# Patient Record
Sex: Female | Born: 1965 | Race: White | Hispanic: No | Marital: Married | Smoking: Never smoker
Health system: Southern US, Community
[De-identification: ages and names within clinical notes are randomized; demographics above are authoritative.]

## PROBLEM LIST (undated history)

## (undated) DIAGNOSIS — K649 Unspecified hemorrhoids: Secondary | ICD-10-CM

## (undated) DIAGNOSIS — E0590 Thyrotoxicosis, unspecified without thyrotoxic crisis or storm: Secondary | ICD-10-CM

## (undated) DIAGNOSIS — Z9289 Personal history of other medical treatment: Secondary | ICD-10-CM

## (undated) DIAGNOSIS — K219 Gastro-esophageal reflux disease without esophagitis: Secondary | ICD-10-CM

## (undated) DIAGNOSIS — J309 Allergic rhinitis, unspecified: Secondary | ICD-10-CM

## (undated) DIAGNOSIS — B019 Varicella without complication: Secondary | ICD-10-CM

## (undated) DIAGNOSIS — E7800 Pure hypercholesterolemia, unspecified: Secondary | ICD-10-CM

## (undated) DIAGNOSIS — C50919 Malignant neoplasm of unspecified site of unspecified female breast: Secondary | ICD-10-CM

## (undated) DIAGNOSIS — R519 Headache, unspecified: Secondary | ICD-10-CM

## (undated) DIAGNOSIS — R51 Headache: Secondary | ICD-10-CM

## (undated) HISTORY — PX: WISDOM TOOTH EXTRACTION: SHX21

## (undated) HISTORY — DX: Headache, unspecified: R51.9

## (undated) HISTORY — DX: Varicella without complication: B01.9

## (undated) HISTORY — PX: OVARIAN CYST REMOVAL: SHX89

## (undated) HISTORY — PX: APPENDECTOMY: SHX54

## (undated) HISTORY — DX: Allergic rhinitis, unspecified: J30.9

## (undated) HISTORY — DX: Personal history of other medical treatment: Z92.89

## (undated) HISTORY — PX: ABDOMINAL SURGERY: SHX537

## (undated) HISTORY — PX: TUBAL LIGATION: SHX77

## (undated) HISTORY — DX: Headache: R51

## (undated) HISTORY — DX: Malignant neoplasm of unspecified site of unspecified female breast: C50.919

---

## 1999-08-27 ENCOUNTER — Other Ambulatory Visit: Admission: RE | Admit: 1999-08-27 | Discharge: 1999-08-27 | Payer: Self-pay | Admitting: Gynecology

## 2002-03-21 ENCOUNTER — Other Ambulatory Visit: Admission: RE | Admit: 2002-03-21 | Discharge: 2002-03-21 | Payer: Self-pay | Admitting: Obstetrics & Gynecology

## 2004-09-01 ENCOUNTER — Other Ambulatory Visit: Admission: RE | Admit: 2004-09-01 | Discharge: 2004-09-01 | Payer: Self-pay | Admitting: Obstetrics & Gynecology

## 2010-03-31 ENCOUNTER — Emergency Department: Payer: Self-pay | Admitting: Emergency Medicine

## 2012-03-02 ENCOUNTER — Emergency Department (HOSPITAL_COMMUNITY): Payer: Commercial Managed Care - PPO

## 2012-03-02 ENCOUNTER — Encounter (HOSPITAL_COMMUNITY): Payer: Self-pay | Admitting: *Deleted

## 2012-03-02 ENCOUNTER — Encounter (HOSPITAL_COMMUNITY): Payer: Self-pay | Admitting: Anesthesiology

## 2012-03-02 ENCOUNTER — Encounter (HOSPITAL_COMMUNITY)
Admission: EM | Disposition: A | Discharge status: Home / Self Care | Payer: Self-pay | Source: Home / Self Care | Attending: General Surgery

## 2012-03-02 ENCOUNTER — Observation Stay (HOSPITAL_COMMUNITY)
Admission: EM | Admit: 2012-03-02 | Discharge: 2012-03-04 | Disposition: A | Discharge status: Home / Self Care | Payer: Commercial Managed Care - PPO | Attending: General Surgery | Admitting: General Surgery

## 2012-03-02 ENCOUNTER — Emergency Department (HOSPITAL_COMMUNITY): Payer: Commercial Managed Care - PPO | Admitting: Anesthesiology

## 2012-03-02 ENCOUNTER — Encounter (HOSPITAL_COMMUNITY): Payer: Self-pay | Admitting: General Practice

## 2012-03-02 DIAGNOSIS — K3580 Unspecified acute appendicitis: Principal | ICD-10-CM | POA: Insufficient documentation

## 2012-03-02 HISTORY — PX: LAPAROSCOPIC APPENDECTOMY: SHX408

## 2012-03-02 LAB — URINALYSIS, ROUTINE W REFLEX MICROSCOPIC
Glucose, UA: NEGATIVE mg/dL
Ketones, ur: NEGATIVE mg/dL
Leukocytes, UA: NEGATIVE
Protein, ur: NEGATIVE mg/dL
Urobilinogen, UA: 1 mg/dL (ref 0.0–1.0)

## 2012-03-02 LAB — COMPREHENSIVE METABOLIC PANEL WITH GFR
ALT: 29 U/L (ref 0–35)
AST: 40 U/L — ABNORMAL HIGH (ref 0–37)
Albumin: 2.9 g/dL — ABNORMAL LOW (ref 3.5–5.2)
Alkaline Phosphatase: 127 U/L — ABNORMAL HIGH (ref 39–117)
BUN: 5 mg/dL — ABNORMAL LOW (ref 6–23)
CO2: 26 meq/L (ref 19–32)
Calcium: 8.2 mg/dL — ABNORMAL LOW (ref 8.4–10.5)
Chloride: 98 meq/L (ref 96–112)
Creatinine, Ser: 0.71 mg/dL (ref 0.50–1.10)
GFR calc Af Amer: >90 mL/min
GFR calc non Af Amer: >90 mL/min
Glucose, Bld: 117 mg/dL — ABNORMAL HIGH (ref 70–99)
Potassium: 3.5 meq/L (ref 3.5–5.1)
Sodium: 133 meq/L — ABNORMAL LOW (ref 135–145)
Total Bilirubin: 0.5 mg/dL (ref 0.3–1.2)
Total Protein: 6.5 g/dL (ref 6.0–8.3)

## 2012-03-02 LAB — CBC WITH DIFFERENTIAL/PLATELET
HCT: 32.7 % — ABNORMAL LOW (ref 36.0–46.0)
Hemoglobin: 11.1 g/dL — ABNORMAL LOW (ref 12.0–15.0)
Lymphocytes Relative: 15 % (ref 12–46)
Lymphs Abs: 1.6 10*3/uL (ref 0.7–4.0)
MCHC: 33.9 g/dL (ref 30.0–36.0)
Monocytes Absolute: 1.1 10*3/uL — ABNORMAL HIGH (ref 0.1–1.0)
Monocytes Relative: 10 % (ref 3–12)
Neutro Abs: 7.8 10*3/uL — ABNORMAL HIGH (ref 1.7–7.7)
Neutrophils Relative %: 73 % (ref 43–77)
RBC: 3.61 MIL/uL — ABNORMAL LOW (ref 3.87–5.11)
WBC: 10.6 10*3/uL — ABNORMAL HIGH (ref 4.0–10.5)

## 2012-03-02 LAB — URINE MICROSCOPIC-ADD ON

## 2012-03-02 SURGERY — APPENDECTOMY, LAPAROSCOPIC
Anesthesia: General | Site: Abdomen | Wound class: Contaminated

## 2012-03-02 MED ORDER — PANTOPRAZOLE SODIUM 40 MG IV SOLR
40.0000 mg | Freq: Every day | INTRAVENOUS | Status: DC
  Administered 2012-03-03: 40 mg via INTRAVENOUS
  Filled 2012-03-02: qty 40

## 2012-03-02 MED ORDER — ONDANSETRON HCL 4 MG/2ML IJ SOLN: 4.0000 mg | Freq: Four times a day (QID) | INTRAMUSCULAR | Status: DC | PRN

## 2012-03-02 MED ORDER — MORPHINE SULFATE 4 MG/ML IJ SOLN
4.0000 mg | Freq: Once | INTRAMUSCULAR | Status: AC
  Administered 2012-03-02: 4 mg via INTRAVENOUS
  Filled 2012-03-02: qty 1

## 2012-03-02 MED ORDER — PROPOFOL 10 MG/ML IV BOLUS
INTRAVENOUS | Status: DC | PRN
  Administered 2012-03-02: 100 mg via INTRAVENOUS

## 2012-03-02 MED ORDER — ROCURONIUM BROMIDE 100 MG/10ML IV SOLN
INTRAVENOUS | Status: DC | PRN
  Administered 2012-03-02: 5 mg via INTRAVENOUS
  Administered 2012-03-02: 25 mg via INTRAVENOUS

## 2012-03-02 MED ORDER — ONDANSETRON HCL 4 MG/2ML IJ SOLN
INTRAMUSCULAR | Status: DC | PRN
  Administered 2012-03-02: 4 mg via INTRAVENOUS

## 2012-03-02 MED ORDER — FENTANYL CITRATE 0.05 MG/ML IJ SOLN
INTRAMUSCULAR | Status: DC | PRN
  Administered 2012-03-02 (×2): 25 ug via INTRAVENOUS
  Administered 2012-03-02 (×2): 50 ug via INTRAVENOUS

## 2012-03-02 MED ORDER — BUPIVACAINE HCL 0.5 % IJ SOLN
INTRAMUSCULAR | Status: DC | PRN
  Administered 2012-03-02: 10 mL

## 2012-03-02 MED ORDER — NEOSTIGMINE METHYLSULFATE 1 MG/ML IJ SOLN
INTRAMUSCULAR | Status: DC | PRN
  Administered 2012-03-02: 3 mg via INTRAVENOUS

## 2012-03-02 MED ORDER — SODIUM CHLORIDE 0.9 % IV SOLN
1.0000 g | Freq: Once | INTRAVENOUS | Status: DC
  Filled 2012-03-02: qty 1

## 2012-03-02 MED ORDER — MIDAZOLAM HCL 5 MG/5ML IJ SOLN
INTRAMUSCULAR | Status: DC | PRN
  Administered 2012-03-02: 2 mg via INTRAVENOUS

## 2012-03-02 MED ORDER — GLYCOPYRROLATE 0.2 MG/ML IJ SOLN
INTRAMUSCULAR | Status: DC | PRN
  Administered 2012-03-02: 0.6 mg via INTRAVENOUS

## 2012-03-02 MED ORDER — LIDOCAINE HCL (CARDIAC) 20 MG/ML IV SOLN
INTRAVENOUS | Status: DC | PRN
  Administered 2012-03-02: 30 mg via INTRAVENOUS

## 2012-03-02 MED ORDER — SODIUM CHLORIDE 0.9 % IR SOLN
Status: DC | PRN
  Administered 2012-03-02: 1000 mL

## 2012-03-02 MED ORDER — ENOXAPARIN SODIUM 40 MG/0.4ML ~~LOC~~ SOLN
40.0000 mg | SUBCUTANEOUS | Status: DC
  Administered 2012-03-03 – 2012-03-04 (×2): 40 mg via SUBCUTANEOUS
  Filled 2012-03-02 (×2): qty 0.4

## 2012-03-02 MED ORDER — KETOROLAC TROMETHAMINE 30 MG/ML IJ SOLN
30.0000 mg | Freq: Once | INTRAMUSCULAR | Status: AC
  Administered 2012-03-02: 30 mg via INTRAVENOUS
  Filled 2012-03-02: qty 1

## 2012-03-02 MED ORDER — SUCCINYLCHOLINE CHLORIDE 20 MG/ML IJ SOLN
INTRAMUSCULAR | Status: DC | PRN
  Administered 2012-03-02: 100 mg via INTRAVENOUS

## 2012-03-02 MED ORDER — ONDANSETRON HCL 4 MG/2ML IJ SOLN
4.0000 mg | Freq: Once | INTRAMUSCULAR | Status: AC
  Administered 2012-03-02: 4 mg via INTRAVENOUS
  Filled 2012-03-02: qty 2

## 2012-03-02 MED ORDER — MORPHINE SULFATE 4 MG/ML IJ SOLN
INTRAMUSCULAR | Status: AC
  Administered 2012-03-02: 4 mg
  Filled 2012-03-02: qty 1

## 2012-03-02 MED ORDER — HYDROCODONE-ACETAMINOPHEN 5-325 MG PO TABS
1.0000 | ORAL_TABLET | ORAL | Status: DC | PRN
  Administered 2012-03-03: 1 via ORAL
  Administered 2012-03-03 – 2012-03-04 (×4): 2 via ORAL
  Filled 2012-03-02: qty 2
  Filled 2012-03-02: qty 1
  Filled 2012-03-02 (×3): qty 2

## 2012-03-02 MED ORDER — LACTATED RINGERS IV SOLN
INTRAVENOUS | Status: DC | PRN
  Administered 2012-03-02: 22:00:00 via INTRAVENOUS

## 2012-03-02 MED ORDER — HYDROMORPHONE HCL PF 1 MG/ML IJ SOLN
INTRAMUSCULAR | Status: AC
  Filled 2012-03-02: qty 1

## 2012-03-02 MED ORDER — SODIUM CHLORIDE 0.9 % IV SOLN
Freq: Once | INTRAVENOUS | Status: AC
  Administered 2012-03-02: 18:00:00 via INTRAVENOUS

## 2012-03-02 MED ORDER — LACTATED RINGERS IV SOLN
INTRAVENOUS | Status: DC
  Administered 2012-03-03 (×2): via INTRAVENOUS

## 2012-03-02 MED ORDER — HYDROMORPHONE HCL PF 1 MG/ML IJ SOLN
1.0000 mg | INTRAMUSCULAR | Status: DC | PRN
  Administered 2012-03-02: 1 mg via INTRAVENOUS

## 2012-03-02 SURGICAL SUPPLY — 49 items
APL SKNCLS STERI-STRIP NONHPOA (GAUZE/BANDAGES/DRESSINGS) ×1
BAG HAMPER (MISCELLANEOUS) ×2 IMPLANT
BAG SPEC RTRVL LRG 6X4 10 (ENDOMECHANICALS) ×1
BENZOIN TINCTURE PRP APPL 2/3 (GAUZE/BANDAGES/DRESSINGS) ×2 IMPLANT
CLOTH BEACON ORANGE TIMEOUT ST (SAFETY) ×2 IMPLANT
COVER LIGHT HANDLE STERIS (MISCELLANEOUS) ×4 IMPLANT
CUTTER ENDO LINEAR 45M (STAPLE) ×2 IMPLANT
DECANTER SPIKE VIAL GLASS SM (MISCELLANEOUS) ×2 IMPLANT
DEVICE TROCAR PUNCTURE CLOSURE (ENDOMECHANICALS) ×2 IMPLANT
DURAPREP 26ML APPLICATOR (WOUND CARE) ×2 IMPLANT
ELECT REM PT RETURN 9FT ADLT (ELECTROSURGICAL) ×2
ELECTRODE REM PT RTRN 9FT ADLT (ELECTROSURGICAL) ×1 IMPLANT
FILTER SMOKE EVAC LAPAROSHD (FILTER) ×2 IMPLANT
FORMALIN 10 PREFIL 120ML (MISCELLANEOUS) ×2 IMPLANT
GLOVE BIOGEL PI IND STRL 7.0 (GLOVE) IMPLANT
GLOVE BIOGEL PI IND STRL 7.5 (GLOVE) ×1 IMPLANT
GLOVE BIOGEL PI INDICATOR 7.0 (GLOVE) ×1
GLOVE BIOGEL PI INDICATOR 7.5 (GLOVE) ×1
GLOVE ECLIPSE 6.5 STRL STRAW (GLOVE) ×1 IMPLANT
GLOVE ECLIPSE 7.0 STRL STRAW (GLOVE) ×2 IMPLANT
GLOVE EXAM NITRILE MD LF STRL (GLOVE) ×1 IMPLANT
GOWN STRL REIN XL XLG (GOWN DISPOSABLE) ×4 IMPLANT
INST SET LAPROSCOPIC AP (KITS) ×2 IMPLANT
IV NS IRRIG 3000ML ARTHROMATIC (IV SOLUTION) ×1 IMPLANT
KIT ROOM TURNOVER APOR (KITS) ×2 IMPLANT
MANIFOLD NEPTUNE II (INSTRUMENTS) ×2 IMPLANT
NDL INSUFFLATION 14GA 120MM (NEEDLE) ×1 IMPLANT
NEEDLE INSUFFLATION 14GA 120MM (NEEDLE) ×2 IMPLANT
NS IRRIG 1000ML POUR BTL (IV SOLUTION) ×2 IMPLANT
PACK LAP CHOLE LZT030E (CUSTOM PROCEDURE TRAY) ×2 IMPLANT
PAD ARMBOARD 7.5X6 YLW CONV (MISCELLANEOUS) ×2 IMPLANT
POUCH SPECIMEN RETRIEVAL 10MM (ENDOMECHANICALS) ×2 IMPLANT
RELOAD 45 VASCULAR/THIN (ENDOMECHANICALS) IMPLANT
RELOAD STAPLE 45 2.5 WHT GRN (ENDOMECHANICALS) IMPLANT
RELOAD STAPLE 45 3.5 BLU ETS (ENDOMECHANICALS) IMPLANT
RELOAD STAPLE TA45 3.5 REG BLU (ENDOMECHANICALS) ×2 IMPLANT
SEALER TISSUE G2 CVD JAW 35 (ENDOMECHANICALS) ×1 IMPLANT
SEALER TISSUE G2 CVD JAW 45CM (ENDOMECHANICALS) ×1
SET BASIN LINEN APH (SET/KITS/TRAYS/PACK) ×2 IMPLANT
SET TUBE IRRIG SUCTION NO TIP (IRRIGATION / IRRIGATOR) ×1 IMPLANT
SLEEVE Z-THREAD 5X100MM (TROCAR) IMPLANT
STRIP CLOSURE SKIN 1/2X4 (GAUZE/BANDAGES/DRESSINGS) ×2 IMPLANT
SUT MNCRL AB 4-0 PS2 18 (SUTURE) ×2 IMPLANT
SUT VIC AB 2-0 CT2 27 (SUTURE) ×2 IMPLANT
TRAY FOLEY CATH 14FR (SET/KITS/TRAYS/PACK) ×2 IMPLANT
TROCAR Z-THAD FIOS HNDL 12X100 (TROCAR) ×2 IMPLANT
TROCAR Z-THRD FIOS HNDL 11X100 (TROCAR) ×2 IMPLANT
TROCAR Z-THREAD FIOS 5X100MM (TROCAR) ×2 IMPLANT
WARMER LAPAROSCOPE (MISCELLANEOUS) ×2 IMPLANT

## 2012-03-02 NOTE — ED Provider Notes (Signed)
History   Scribed for Geoffery Lyons, MD, the patient was seen in room APA14/APA14 . This chart was scribed by Lewanda Rife.    CSN: [id number]  Arrival date & time 03/02/12  1702   First MD Initiated Contact with Patient 03/02/12 1713      Chief Complaint  Patient presents with  . Abdominal Pain    (Consider location/radiation/quality/duration/timing/severity/associated sxs/prior treatment) HPI Erica Woods is a 47 y.o. female who presents to the Emergency Department complaining of constant moderate right lower quadrant abdominal pain for the past 4 hours. Pt describes the pain as acute. Pt reports having burning dysuria in the last 4 hours without hematuria. Pt denies fever, emesis, nausea and hx of kidney stones. Pt reports hx of tubal ligation and ovarian cystectomy.   History reviewed. No pertinent past medical history.  Past Surgical History  Procedure Date  . Abdominal surgery   . Tubal ligation     No family history on file.  History  Substance Use Topics  . Smoking status: Never Smoker   . Smokeless tobacco: Not on file  . Alcohol Use: No    OB History    Grav Para Term Preterm Abortions TAB SAB Ect Mult Living                  Review of Systems  Constitutional: Negative.   HENT: Negative.   Respiratory: Negative.   Cardiovascular: Negative.   Gastrointestinal: Positive for abdominal pain. Negative for nausea, vomiting and diarrhea.  Musculoskeletal: Negative.   Skin: Negative.   Neurological: Negative.   Hematological: Negative.   Psychiatric/Behavioral: Negative.     Allergies  Review of patient's allergies indicates no known allergies.  Home Medications  No current outpatient prescriptions on file.  BP 123/58  Pulse 79  Temp 98.5 F (36.9 C) (Oral)  Resp 20  Ht 5\' 4"  (1.626 m)  Wt 140 lb (63.504 kg)  BMI 24.03 kg/m2  SpO2 99%  LMP 02/27/2012  Physical Exam  Nursing note and vitals reviewed. Constitutional: She is oriented to  person, place, and time. She appears well-developed and well-nourished.  HENT:  Head: Normocephalic and atraumatic.  Eyes: Conjunctivae normal are normal. Pupils are equal, round, and reactive to light.  Neck: Neck supple. No tracheal deviation present. No thyromegaly present.  Cardiovascular: Normal rate and regular rhythm.   No murmur heard. Pulmonary/Chest: Effort normal and breath sounds normal.  Abdominal: Soft. Bowel sounds are normal. She exhibits no distension. There is tenderness. There is guarding. There is no rebound.       moderate tenderness to right lower quadrant with no rebound. Voluntary guarding present   Musculoskeletal: Normal range of motion. She exhibits no edema and no tenderness.  Neurological: She is alert and oriented to person, place, and time. Coordination normal.  Skin: Skin is warm and dry. No rash noted.  Psychiatric: She has a normal mood and affect.    ED Course  Procedures (including critical care time)  Labs Reviewed - No data to display No results found.   No diagnosis found.    MDM  The patient presented with what seemed like renal colic, however the renal ct shows acute appendicitis.  I have consulted Dr. Leticia Penna who will see the patient in the ED.  She has been given invanz and pain meds in the meantime.        I personally performed the services described in this documentation, which was scribed in my presence. The recorded information  has been reviewed and is accurate.      Geoffery Lyons, MD 03/02/12 [phone]

## 2012-03-02 NOTE — ED Notes (Signed)
Patient states she started having right lower abdominal pain that started about 1 hour ago.

## 2012-03-02 NOTE — ED Notes (Signed)
Patient transported to or via stretcher.

## 2012-03-02 NOTE — Anesthesia Postprocedure Evaluation (Addendum)
  Anesthesia Post-op Note  Patient: Erica Woods  Procedure(s) Performed: Procedure(s) (LRB) with comments: APPENDECTOMY LAPAROSCOPIC (N/A)  Patient Location: PACU  Anesthesia Type:General  Level of Consciousness: awake, alert  and oriented  Airway and Oxygen Therapy: Patient Spontanous Breathing and Patient connected to nasal cannula oxygen  Post-op Pain: mild  Post-op Assessment: Post-op Vital signs reviewed, Patient's Cardiovascular Status Stable, Respiratory Function Stable, Patent Airway and No signs of Nausea or vomiting  Post-op Vital Signs: Reviewed and stable  Complications: No apparent anesthesia complications  03/03/12  Patient doing well, VSS.  No apparent anesthesia complications.

## 2012-03-02 NOTE — Anesthesia Preprocedure Evaluation (Addendum)
Anesthesia Evaluation  Patient identified by MRN, date of birth, ID band Patient awake    Reviewed: Allergy & Precautions, H&P , NPO status , Patient's Chart, lab work & pertinent test results  History of Anesthesia Complications Negative for: history of anesthetic complications  Airway Mallampati: I TM Distance: >3 FB Neck ROM: Full    Dental  (+) Teeth Intact and Dental Advisory Given   Pulmonary neg pulmonary ROS,  breath sounds clear to auscultation        Cardiovascular Exercise Tolerance: Good     Neuro/Psych negative neurological ROS  negative psych ROS   GI/Hepatic GERD-  Poorly Controlled,  Endo/Other    Renal/GU      Musculoskeletal   Abdominal   Peds  Hematology   Anesthesia Other Findings   Reproductive/Obstetrics                          Anesthesia Physical Anesthesia Plan  ASA: I and emergent  Anesthesia Plan: General   Post-op Pain Management:    Induction: Intravenous, Rapid sequence and Cricoid pressure planned  Airway Management Planned: Oral ETT  Additional Equipment:   Intra-op Plan:   Post-operative Plan: Extubation in OR  Informed Consent: I have reviewed the patients History and Physical, chart, labs and discussed the procedure including the risks, benefits and alternatives for the proposed anesthesia with the patient or authorized representative who has indicated his/her understanding and acceptance.     Plan Discussed with: Anesthesiologist  Anesthesia Plan Comments: (Telephone consult with Dr. Jayme Cloud, agreeable to GOT/RSI.)       Anesthesia Quick Evaluation

## 2012-03-02 NOTE — ED Notes (Signed)
Abdominal pain right lower quadrant

## 2012-03-02 NOTE — Anesthesia Procedure Notes (Signed)
Procedure Name: Intubation Date/Time: 03/02/2012 9:37 PM Performed by: Glynn Octave E Pre-anesthesia Checklist: Patient identified, Patient being monitored, Timeout performed, Emergency Drugs available and Suction available Patient Re-evaluated:Patient Re-evaluated prior to inductionOxygen Delivery Method: Circle System Utilized Preoxygenation: Pre-oxygenation with 100% oxygen Intubation Type: IV induction Ventilation: Mask ventilation without difficulty Laryngoscope Size: Mac and 3 Grade View: Grade II Tube type: Oral Tube size: 7.0 mm Number of attempts: 1 Airway Equipment and Method: stylet Placement Confirmation: ETT inserted through vocal cords under direct vision,  positive ETCO2 and breath sounds checked- equal and bilateral Secured at: 21 cm Tube secured with: Tape Dental Injury: Teeth and Oropharynx as per pre-operative assessment

## 2012-03-02 NOTE — Transfer of Care (Signed)
Immediate Anesthesia Transfer of Care Note  Patient: Erica Woods  Procedure(s) Performed: Procedure(s) (LRB) with comments: APPENDECTOMY LAPAROSCOPIC (N/A)  Patient Location: PACU  Anesthesia Type:General  Level of Consciousness: awake and alert   Airway & Oxygen Therapy: Patient Spontanous Breathing and Patient connected to nasal cannula oxygen  Post-op Assessment: Report given to PACU RN and Post -op Vital signs reviewed and stable  Post vital signs: Reviewed and stable  Complications: No apparent anesthesia complications

## 2012-03-02 NOTE — Op Note (Signed)
Patient:  Erica Woods  DOB:  03-22-1965  MRN:  [id number]   Preop Diagnosis:  Acute appendicitis  Postop Diagnosis:  The same  Procedure:  Laparoscopic appendectomy  Surgeon:  Dr. Tilford Pillar  Anes:  General endotracheal, 0.5% Sensorcaine plain for local  Indications:  Patient is a 47 year old female presented to [facility] emergency department with right lower quadrant abdominal pain. Workup and evaluation was consistent for acute appendicitis. Risks benefits alternatives a laparoscopic possible open appendectomy including but not limited to risk of bleeding, infection, appendiceal stump leak, intraoperative cardiac and pulmonary ventral discussed with patient. Her questions and concerns are addressed the patient as consented for the planned procedure.  Procedure note:  Patient is taken to the or is placed in supine position the or table time the general anesthetic is administered. Once patient was asleep she was endotracheally intubated by the nurse anesthetist. At this point a Foley cath is placed in standard sterile fashion by the operative staff. Her abdomen is prepped with DuraPrep solution and draped in standard fashion. Time out was performed. Stab incision was created infraumbilically with 11 blade scalpel with additional dissection down to subcuticular tissue carried out using a Coker clamp. The clamp was utilized to grasp the anterior normal fascia and lift this anteriorly. A Veress needle is inserted.  Saline drop test is utilized confirm intraperitoneal placement the pneumoperitoneum was initiated. Once sufficient pneumoperitoneum was obtained a 12 mm trochars inserted over laparoscopic line visualization the trocar entering into the peritoneal cavity. At this point the inner cannulas removed lap scope was reinserted there is no evidence of any trocar or Veress the placement injury. At this time the remaining trochars replaced a 5 mm in the suprapubic region and a 11 mm in the left  lateral abdominal wall. Patient's placed into a Trendelenburg left lateral decubitus position. The cecum was identified a tiny or fall down to the base the appendix. The appendix is noted to be retrocecal. Its course does turned behind the cecum and a retroperitoneal fashion. I was able to create a window between the mesoappendix and the appendix base as it entered into the cecum. As able to use this window to elevate the base the appendix anteriorly to further dissect the mesoappendix. A combination of Enseal bipolar ligation and blunt grasper dissection was carried out to carefully elevate the remainder of the appendix. A suction irrigator was utilized to additionally bluntly dissect and elevate the appendix. Upon completely freeing the appendix the base the appendix is divided using Endo GIA 45 standard stapler. At this point the appendix is placed into an Endo Catch bag and placed into the right upper quadrant. The patient was placed back in a supine position. The surgical site was copiously irrigated with warm saline with a suction irrigator. Returning aspirate was clear. The mesoappendix was inspected hemostasis noted be excellent. The staple line was noted be well approximated. As quite pleased with the appearance of this time her my attention to closure.  Using an Endo Close suture passing device a 2-0 Vicryl sutures passed through both the 12 and 11 mm trocar sites. With the sutures in place the appendix is retrieved was removed through the umbilical trocar site and intact Endo Catch bag. It was placed in the back table and sent as a perm specimen to pathology. At this time the pneumoperitoneum was evacuated. Trochars were removed. The Vicryl sutures secured. Local anesthetic was instilled. At a 4-0 Monocryl utilized reapproximate the skin edges at all  3 trocar sites. The skin was washed dried moist dry towel. Benzoin is applied around incision. Half-inch Steri-Strips are placed. The drapes removed  patient left come out of general anesthetic. She was transferred to the PACU in stable condition. At the conclusion of procedure all instrument, sponge, needle counts are correct. Patient tolerated procedure extremely well.  Complications:  None apparent  EBL:  Minimal  Specimen:  Appendix

## 2012-03-02 NOTE — H&P (Signed)
Erica Woods is an 47 y.o. female.   Chief Complaint: Right lower quadrant abdominal pain HPI: Patient states she started developing pain in the right lower abdomen around noon today. Pain is been persistent. It is localized the right lower quadrant with no significant radiation. It is worse with movement. It is worse with palpation. She states the car ride to the hospital exacerbated her symptoms. She has had associated nausea but no emesis. Some chills. No change in bowel movements. No melena or hematochezia. No similar symptomatology in the past. Her appetite is still present although the last she 8 was this morning at 9 AM. No sick contacts. No unusual travel or exposure.  History reviewed. No pertinent past medical history.  Past Surgical History  Procedure Date  . Abdominal surgery   . Tubal ligation     No family history on file. Social History:  reports that she has never smoked. She does not have any smokeless tobacco history on file. She reports that she does not drink alcohol. Her drug history not on file.  Allergies: No Known Allergies   (Not in a hospital admission)  Results for orders placed during the hospital encounter of 03/02/12 (from the past 48 hour(s))  URINALYSIS, ROUTINE W REFLEX MICROSCOPIC     Status: Abnormal   Collection Time   03/02/12  5:40 PM      Component Value Range Comment   Color, Urine YELLOW  YELLOW    APPearance CLEAR  CLEAR    Specific Gravity, Urine <1.005 (*) 1.005 - 1.030    pH 6.5  5.0 - 8.0    Glucose, UA NEGATIVE  NEGATIVE mg/dL    Hgb urine dipstick MODERATE (*) NEGATIVE    Bilirubin Urine NEGATIVE  NEGATIVE    Ketones, ur NEGATIVE  NEGATIVE mg/dL    Protein, ur NEGATIVE  NEGATIVE mg/dL    Urobilinogen, UA 1.0  0.0 - 1.0 mg/dL    Nitrite NEGATIVE  NEGATIVE    Leukocytes, UA NEGATIVE  NEGATIVE   URINE MICROSCOPIC-ADD ON     Status: Normal   Collection Time   03/02/12  5:40 PM      Component Value Range Comment   WBC, UA 0-2  <3  WBC/hpf    RBC / HPF 0-2  <3 RBC/hpf   CBC WITH DIFFERENTIAL     Status: Abnormal   Collection Time   03/02/12  6:05 PM      Component Value Range Comment   WBC 10.6 (*) 4.0 - 10.5 K/uL    RBC 3.61 (*) 3.87 - 5.11 MIL/uL    Hemoglobin 11.1 (*) 12.0 - 15.0 g/dL    HCT 40.9 (*) 81.1 - 46.0 %    MCV 90.6  78.0 - 100.0 fL    MCH 30.7  26.0 - 34.0 pg    MCHC 33.9  30.0 - 36.0 g/dL    RDW 91.4  78.2 - 95.6 %    Platelets 295  150 - 400 K/uL    Neutrophils Relative 73  43 - 77 %    Neutro Abs 7.8 (*) 1.7 - 7.7 K/uL    Lymphocytes Relative 15  12 - 46 %    Lymphs Abs 1.6  0.7 - 4.0 K/uL    Monocytes Relative 10  3 - 12 %    Monocytes Absolute 1.1 (*) 0.1 - 1.0 K/uL    Eosinophils Relative 1  0 - 5 %    Eosinophils Absolute 0.1  0.0 -  0.7 K/uL    Basophils Relative 0  0 - 1 %    Basophils Absolute 0.0  0.0 - 0.1 K/uL   COMPREHENSIVE METABOLIC PANEL     Status: Abnormal   Collection Time   03/02/12  6:05 PM      Component Value Range Comment   Sodium 133 (*) 135 - 145 mEq/L    Potassium 3.5  3.5 - 5.1 mEq/L    Chloride 98  96 - 112 mEq/L    CO2 26  19 - 32 mEq/L    Glucose, Bld 117 (*) 70 - 99 mg/dL    BUN 5 (*) 6 - 23 mg/dL    Creatinine, Ser 1.61  0.50 - 1.10 mg/dL    Calcium 8.2 (*) 8.4 - 10.5 mg/dL    Total Protein 6.5  6.0 - 8.3 g/dL    Albumin 2.9 (*) 3.5 - 5.2 g/dL    AST 40 (*) 0 - 37 U/L    ALT 29  0 - 35 U/L    Alkaline Phosphatase 127 (*) 39 - 117 U/L    Total Bilirubin 0.5  0.3 - 1.2 mg/dL    GFR calc non Af Amer >90  >90 mL/min    GFR calc Af Amer >90  >90 mL/min    Ct Abdomen Pelvis Wo Contrast  03/02/2012  *RADIOLOGY REPORT*  Clinical Data:   right lower quadrant pain.  CT ABDOMEN AND PELVIS WITHOUT CONTRAST  Technique:  Multidetector CT imaging of the abdomen and pelvis was performed following the standard protocol without intravenous contrast.  Comparison: None.  Findings: Visualized lung bases clear.  Unremarkable uninfused evaluation of liver, gallbladder,  spleen, adrenal glands, pancreas, kidneys.  Stomach, small bowel, and colon are nondilated.  The appendix is fluid-filled, thick walled, dilated to 12 mm diameter, with moderate surrounding inflammatory/edematous changes. No extraluminal gas or loculated fluid collections.  The appendix is retrocecal.  No ascites.  Urinary bladder physiologically distended.  Uterus and adnexal regions grossly unremarkable.  No adenopathy localized. Bilateral pelvic phleboliths.  Early degenerative disc disease L4- 5.  IMPRESSION:  1.  Acute appendicitis without abscess. I telephoned the critical test results to Dr. Judd Lien at the time of interpretation.   Original Report Authenticated By: D. Andria Rhein, MD     Review of Systems  Constitutional: Positive for chills. Negative for weight loss, malaise/fatigue and diaphoresis.  HENT: Negative.   Eyes: Negative.   Respiratory: Negative.   Cardiovascular: Negative.   Gastrointestinal: Positive for nausea, abdominal pain (right lower quadrant) and diarrhea. Negative for vomiting, constipation, blood in stool and melena.  Genitourinary: Negative.   Musculoskeletal: Negative.   Skin: Negative.   Neurological: Negative.  Negative for weakness.  Endo/Heme/Allergies: Negative.   Psychiatric/Behavioral: Negative.     Blood pressure 103/60, pulse 75, temperature 98.5 F (36.9 C), temperature source Oral, resp. rate 20, height 5\' 4"  (1.626 m), weight 63.504 kg (140 lb), last menstrual period 02/27/2012, SpO2 97.00%. Physical Exam  Constitutional: She is oriented to person, place, and time. She appears well-developed and well-nourished. No distress.  HENT:  Head: Normocephalic and atraumatic.  Eyes: Conjunctivae normal and EOM are normal. Pupils are equal, round, and reactive to light. No scleral icterus.  Neck: Normal range of motion. Neck supple. No tracheal deviation present. No thyromegaly present.  Cardiovascular: Normal rate, regular rhythm and normal heart sounds.     Respiratory: Effort normal and breath sounds normal. No respiratory distress.  GI: Soft. She exhibits no distension  and no mass. There is tenderness (positive moderate to severe right lower quadrant abdominal tenderness at McBurney's point. Positive Rovsing sign. No diffuse peritoneal signs.). There is rebound and guarding.  Lymphadenopathy:    She has no cervical adenopathy.  Neurological: She is alert and oriented to person, place, and time.  Skin: Skin is warm and dry.     Assessment/Plan Acute appendicitis. Findings were discussed with the patient. Surgical options discussed. Risks benefits alternatives a laparoscopic possible open appendectomy were discussed at length patient. Risk including but not limited to risk of bleeding, infection, appendiceal stump leak, intraoperative cardiac and pulmonary events were discussed with the patient and husband. At this point she will be continued n.p.o. status. Continue IV fluid hydration. Continued on IV antibiotics. She has received 1 g of Invanz in the emergency department. Continue DVT prophylaxis. Patient will be consented and we will plan to proceed with an emergent laparoscopic appendectomy as discussed.  Jina Olenick C 03/02/2012, 8:46 PM

## 2012-03-03 MED ORDER — AMOXICILLIN-POT CLAVULANATE 875-125 MG PO TABS
1.0000 | ORAL_TABLET | Freq: Two times a day (BID) | ORAL | Status: DC
  Administered 2012-03-03 – 2012-03-04 (×3): 1 via ORAL
  Filled 2012-03-03 (×3): qty 1

## 2012-03-03 MED ORDER — MAGNESIUM HYDROXIDE 400 MG/5ML PO SUSP
30.0000 mL | Freq: Once | ORAL | Status: AC
  Administered 2012-03-03: 30 mL via ORAL
  Filled 2012-03-03: qty 30

## 2012-03-03 MED ORDER — PANTOPRAZOLE SODIUM 40 MG PO TBEC
40.0000 mg | DELAYED_RELEASE_TABLET | Freq: Every day | ORAL | Status: DC
  Administered 2012-03-03: 40 mg via ORAL
  Filled 2012-03-03: qty 1

## 2012-03-03 NOTE — Care Management Note (Signed)
    Page 1 of 1   03/03/2012     6:04:33 PM   CARE MANAGEMENT NOTE 03/03/2012  Patient:  Erica Woods, Erica Woods   Account Number:  [id number]  Date Initiated:  03/03/2012  Documentation initiated by:  Sharrie Rothman  Subjective/Objective Assessment:   Pt admitted from home s/p lap appy. Pt lives with her husband and will return home at discharge. Pt is independent with ADL's.     Action/Plan:   No CM or HH needs noted.   Anticipated DC Date:  03/04/2012   Anticipated DC Plan:  HOME/SELF CARE      DC Planning Services  CM consult      Choice offered to / List presented to:             Status of service:  Completed, signed off Medicare Important Message given?   (If response is "NO", the following Medicare IM given date fields will be blank) Date Medicare IM given:   Date Additional Medicare IM given:    Discharge Disposition:  HOME/SELF CARE  Per UR Regulation:    If discussed at Long Length of Stay Meetings, dates discussed:    Comments:  03/03/12 1540 Arlyss Queen, RN BSN CM

## 2012-03-03 NOTE — Progress Notes (Signed)
Notified Dr. Leticia Penna that pt c/o constipation. Order received for one time dose of Milk of Magnesia. Pt still no BM, but denies pain at this time. Sheryn Bison

## 2012-03-03 NOTE — Addendum Note (Signed)
Addendum  created 03/03/12 1046 by Moshe Salisbury, CRNA   Modules edited:Notes Section

## 2012-03-03 NOTE — Progress Notes (Signed)
The patient is receiving Protonix by the intravenous route.  Based on criteria approved by the Pharmacy and Therapeutics Committee and the Medical Executive Committee, the medication is being converted to the equivalent oral dose form.  These criteria include: -No Active GI bleeding -Able to tolerate diet of full liquids (or better) or tube feeding OR able to tolerate other medications by the oral or enteral route  If you have any questions about this conversion, please contact the Pharmacy Department (ext [phone]).  Thank you.  Mady Gemma, [facility] 03/03/2012 1:08 PM

## 2012-03-03 NOTE — Progress Notes (Signed)
1 Day Post-Op  Subjective: No nausea or vomiting. Abdominal pain is controlled. Patient has been ambulate without difficulties.  Objective: Vital signs in last 24 hours: Temp:  [97.8 F (36.6 C)-99.3 F (37.4 C)] 99.3 F (37.4 C) (01/17 0200) Pulse Rate:  [65-88] 87  (01/17 0200) Resp:  [15-24] 16  (01/17 0200) BP: (97-130)/(34-60) 97/45 mmHg (01/17 0500) SpO2:  [93 %-100 %] 100 % (01/17 0200) Weight:  [63.504 kg (140 lb)] 63.504 kg (140 lb) (01/16 1719) Last BM Date: 03/01/12  Intake/Output from previous day: 01/16 0701 - 01/17 0700 In: 1164.6 [I.V.:1164.6] Out: 500 [Urine:500] Intake/Output this shift: Total I/O In: 360 [P.O.:360] Out: -   General appearance: alert and no distress GI: Positive bowel sounds, soft, flat, moderate right lower quadrant abdominal tenderness as expected postoperatively. No diffuse peritoneal signs. Incisions are clean dry and intact.  Lab Results:   [facility] 03/02/12 1805  WBC 10.6*  HGB 11.1*  HCT 32.7*  PLT 295   BMET  Basename 03/02/12 1805  NA 133*  K 3.5  CL 98  CO2 26  GLUCOSE 117*  BUN 5*  CREATININE 0.71  CALCIUM 8.2*   PT/INR No results found for this basename: LABPROT:2,INR:2 in the last 72 hours ABG No results found for this basename: PHART:2,PCO2:2,PO2:2,HCO3:2 in the last 72 hours  Studies/Results: Ct Abdomen Pelvis Wo Contrast  03/02/2012  *RADIOLOGY REPORT*  Clinical Data:   right lower quadrant pain.  CT ABDOMEN AND PELVIS WITHOUT CONTRAST  Technique:  Multidetector CT imaging of the abdomen and pelvis was performed following the standard protocol without intravenous contrast.  Comparison: None.  Findings: Visualized lung bases clear.  Unremarkable uninfused evaluation of liver, gallbladder, spleen, adrenal glands, pancreas, kidneys.  Stomach, small bowel, and colon are nondilated.  The appendix is fluid-filled, thick walled, dilated to 12 mm diameter, with moderate surrounding inflammatory/edematous changes. No  extraluminal gas or loculated fluid collections.  The appendix is retrocecal.  No ascites.  Urinary bladder physiologically distended.  Uterus and adnexal regions grossly unremarkable.  No adenopathy localized. Bilateral pelvic phleboliths.  Early degenerative disc disease L4- 5.  IMPRESSION:  1.  Acute appendicitis without abscess. I telephoned the critical test results to Dr. Judd Lien at the time of interpretation.   Original Report Authenticated By: D. Andria Rhein, MD     Anti-infectives: Anti-infectives     Start     Dose/Rate Route Frequency Ordered Stop   03/03/12 1200   amoxicillin-clavulanate (AUGMENTIN) 875-125 MG per tablet 1 tablet        1 tablet Oral Every 12 hours 03/03/12 1158     03/02/12 1845   ertapenem (INVANZ) 1 g in sodium chloride 0.9 % 50 mL IVPB  Status:  Discontinued        1 g 100 mL/hr over 30 Minutes Intravenous  Once 03/02/12 1834 03/02/12 2345          Assessment/Plan: s/p Procedure(s) (LRB) with comments: APPENDECTOMY LAPAROSCOPIC (N/A) Overall patient is doing well. Increase activity. Advance diet as tolerated. Possible discharge later today if she continues to progress well. Additionally given her retrocecal position of the appendix and the necrotic appearance at the time of her operation I have discussed with patient continuing oral antibiotics for the next 5 days. She understands and is in agreement with current treatment plan .  LOS: 1 day    Erica Woods C 03/03/2012

## 2012-03-03 NOTE — Progress Notes (Signed)
UR Chart Review Completed  

## 2012-03-04 MED ORDER — AMOXICILLIN-POT CLAVULANATE 875-125 MG PO TABS: 1.0000 | ORAL_TABLET | Freq: Two times a day (BID) | ORAL | Status: DC

## 2012-03-04 MED ORDER — HYDROCODONE-ACETAMINOPHEN 5-325 MG PO TABS: 1.0000 | ORAL_TABLET | ORAL | Status: DC | PRN

## 2012-03-04 NOTE — Progress Notes (Addendum)
Pt verbalizes understanding of d/c instructions, follow up info and prescriptions. No questions at this time. IV d/c. Pt d/c via wheelchair by me, accompanied by her husband. Sheryn Bison

## 2012-03-04 NOTE — Plan of Care (Signed)
Problem: Phase I Progression Outcomes Goal: Pain controlled with appropriate interventions Outcome: Completed/Met Date Met:  03/04/12 Pts pain is well controlled with PO medication

## 2012-03-06 ENCOUNTER — Encounter (HOSPITAL_COMMUNITY): Payer: Self-pay | Admitting: General Surgery

## 2012-03-22 NOTE — Discharge Summary (Signed)
Physician Discharge Summary  Patient ID: Erica Woods MRN: [id number] DOB/AGE: 1966/01/08 47 y.o.  Admit date: 03/02/2012 Discharge date: 03/04/2012  Admission Diagnoses:Acute appendicitis   Discharge Diagnoses: the same Active Problems:  * No active hospital problems. *    Discharged Condition: stable  Hospital Course: Patient presented with abdominal pain.  Work-up consistent for appendicitis.  Patient taken to the OR.  Tolerated procedure.  Recovery somewhat slow due to pain and constipation.  Was tolerating a diet and was discharge 1/18.  Consults: None  Significant Diagnostic Studies: radiology: CT scan: abd/pel  Treatments: IV hydration and antibiotics: Invanz  Discharge Exam: Blood pressure 105/66, pulse 95, temperature 100.1 F (37.8 C), temperature source Oral, resp. rate 18, height 5\' 4"  (1.626 m), weight 63.504 kg (140 lb), last menstrual period 02/27/2012, SpO2 65.00%. General appearance: alert and no distress Resp: clear to auscultation bilaterally Cardio: regular rate and rhythm GI: +BS, soft, expected tenderness.  Incision c/d/i.  Disposition: 01-Home or Self Care  Discharge Orders    Future Orders Please Complete By Expires   Diet - low sodium heart healthy      Increase activity slowly      Discharge instructions      Comments:   Increase activity as tolerated. May place ice pack for comfort.  Alternate an anti-inflammatory such as ibuprofen (Motrin, Advil) 400-600mg  every 6 hours with the prescribed pain medication.   Do not take any additional acetaminophen as there is Tylenol in the pain medication.   Driving Restrictions      Comments:   No driving while on pain medications.   Lifting restrictions      Comments:   No lifting over 20lbs for 4-5 weeks post-op.   Discharge wound care:      Comments:   Clean surgical sites with soap and water.  May shower the morning after surgery unless instructed by Dr. Leticia Penna otherwise.  No soaking for 2-3  weeks.    If adhesive strips are in place, they may be removed in 1-2 weeks while in the shower.   Call MD for:  temperature >100.4      Call MD for:  persistant nausea and vomiting      Call MD for:  severe uncontrolled pain      Call MD for:  redness, tenderness, or signs of infection (pain, swelling, redness, odor or green/yellow discharge around incision site)          Medication List     As of 03/22/2012 10:59 PM    TAKE these medications         amoxicillin-clavulanate 875-125 MG per tablet   Commonly known as: AUGMENTIN   Take 1 tablet by mouth every 12 (twelve) hours.      HYDROcodone-acetaminophen 5-325 MG per tablet   Commonly known as: NORCO/VICODIN   Take 1-2 tablets by mouth every 4 (four) hours as needed.         SignedFabio Bering 03/22/2012, 10:59 PM

## 2014-02-15 HISTORY — PX: BREAST LUMPECTOMY: SHX2

## 2014-02-15 HISTORY — PX: BREAST EXCISIONAL BIOPSY: SUR124

## 2014-07-08 ENCOUNTER — Other Ambulatory Visit (HOSPITAL_COMMUNITY): Payer: Self-pay | Admitting: Physician Assistant

## 2014-07-08 DIAGNOSIS — Z1231 Encounter for screening mammogram for malignant neoplasm of breast: Secondary | ICD-10-CM

## 2014-07-09 ENCOUNTER — Encounter (INDEPENDENT_AMBULATORY_CARE_PROVIDER_SITE_OTHER): Payer: Self-pay | Admitting: *Deleted

## 2014-07-17 ENCOUNTER — Encounter (INDEPENDENT_AMBULATORY_CARE_PROVIDER_SITE_OTHER): Payer: Self-pay | Admitting: *Deleted

## 2014-07-17 ENCOUNTER — Other Ambulatory Visit (INDEPENDENT_AMBULATORY_CARE_PROVIDER_SITE_OTHER): Payer: Self-pay | Admitting: *Deleted

## 2014-07-17 DIAGNOSIS — Z1211 Encounter for screening for malignant neoplasm of colon: Secondary | ICD-10-CM

## 2014-07-26 ENCOUNTER — Ambulatory Visit (HOSPITAL_COMMUNITY)
Admission: RE | Admit: 2014-07-26 | Discharge: 2014-07-26 | Disposition: A | Discharge status: Home / Self Care | Payer: Commercial Managed Care - PPO | Source: Ambulatory Visit | Attending: Physician Assistant | Admitting: Physician Assistant

## 2014-07-26 DIAGNOSIS — Z1231 Encounter for screening mammogram for malignant neoplasm of breast: Secondary | ICD-10-CM

## 2014-08-02 ENCOUNTER — Other Ambulatory Visit: Payer: Self-pay | Admitting: Physician Assistant

## 2014-08-02 DIAGNOSIS — R928 Other abnormal and inconclusive findings on diagnostic imaging of breast: Secondary | ICD-10-CM

## 2014-08-27 ENCOUNTER — Ambulatory Visit (HOSPITAL_COMMUNITY)
Admission: RE | Admit: 2014-08-27 | Discharge: 2014-08-27 | Disposition: A | Discharge status: Home / Self Care | Payer: Commercial Managed Care - PPO | Source: Ambulatory Visit | Attending: Physician Assistant | Admitting: Physician Assistant

## 2014-08-27 ENCOUNTER — Other Ambulatory Visit: Payer: Self-pay | Admitting: Diagnostic Radiology

## 2014-08-27 ENCOUNTER — Encounter (HOSPITAL_COMMUNITY): Payer: Commercial Managed Care - PPO

## 2014-08-27 ENCOUNTER — Other Ambulatory Visit: Payer: Self-pay | Admitting: Physician Assistant

## 2014-08-27 DIAGNOSIS — R921 Mammographic calcification found on diagnostic imaging of breast: Secondary | ICD-10-CM | POA: Diagnosis not present

## 2014-08-27 DIAGNOSIS — R928 Other abnormal and inconclusive findings on diagnostic imaging of breast: Secondary | ICD-10-CM

## 2014-09-02 ENCOUNTER — Other Ambulatory Visit: Payer: Self-pay | Admitting: Physician Assistant

## 2014-09-02 DIAGNOSIS — R921 Mammographic calcification found on diagnostic imaging of breast: Secondary | ICD-10-CM

## 2014-09-03 ENCOUNTER — Ambulatory Visit
Admission: RE | Admit: 2014-09-03 | Discharge: 2014-09-03 | Disposition: A | Discharge status: Home / Self Care | Payer: Commercial Managed Care - PPO | Source: Ambulatory Visit | Attending: Physician Assistant | Admitting: Physician Assistant

## 2014-09-03 ENCOUNTER — Ambulatory Visit
Admission: RE | Admit: 2014-09-03 | Discharge: 2014-09-03 | Disposition: A | Discharge status: Home / Self Care | Payer: Commercial Managed Care - PPO | Source: Ambulatory Visit | Attending: Diagnostic Radiology | Admitting: Diagnostic Radiology

## 2014-09-03 DIAGNOSIS — R921 Mammographic calcification found on diagnostic imaging of breast: Secondary | ICD-10-CM

## 2014-09-12 ENCOUNTER — Telehealth (INDEPENDENT_AMBULATORY_CARE_PROVIDER_SITE_OTHER): Payer: Self-pay | Admitting: *Deleted

## 2014-09-12 NOTE — Telephone Encounter (Signed)
Patient needs suprep 

## 2014-09-13 MED ORDER — SUPREP BOWEL PREP KIT 17.5-3.13-1.6 GM/177ML PO SOLN: 1.0000 | Freq: Once | ORAL | Status: DC

## 2014-09-24 ENCOUNTER — Other Ambulatory Visit (HOSPITAL_COMMUNITY): Payer: Self-pay | Admitting: General Surgery

## 2014-09-24 ENCOUNTER — Telehealth (INDEPENDENT_AMBULATORY_CARE_PROVIDER_SITE_OTHER): Payer: Self-pay | Admitting: *Deleted

## 2014-09-24 DIAGNOSIS — D493 Neoplasm of unspecified behavior of breast: Secondary | ICD-10-CM

## 2014-09-24 NOTE — Patient Instructions (Signed)
MELANEE CORDIAL  09/24/2014     @PREFPERIOPPHARMACY @   Your procedure is scheduled on  10/02/2014   Report to [facility] at  730  A.M.  Call this number if you have problems the morning of surgery:  [phone]   Remember:  Do not eat food or drink liquids after midnight.  Take these medicines the morning of surgery with A SIP OF WATER hydrocodone.   Do not wear jewelry, make-up or nail polish.  Do not wear lotions, powders, or perfumes.    Do not shave 48 hours prior to surgery.  Men may shave face and neck.  Do not bring valuables to the hospital.  [facility] is not responsible for any belongings or valuables.  Contacts, dentures or bridgework may not be worn into surgery.  Leave your suitcase in the car.  After surgery it may be brought to your room.  For patients admitted to the hospital, discharge time will be determined by your treatment team.  Patients discharged the day of surgery will not be allowed to drive home.   Name and phone number of your driver:   family Special instructions:  none  Please read over the following fact sheets that you were given. Pain Booklet, Coughing and Deep Breathing, Surgical Site Infection Prevention, Anesthesia Post-op Instructions and Care and Recovery After Surgery      Breast Biopsy A breast biopsy is a procedure where a sample of breast tissue is removed from your breast. The tissue is examined under a microscope to see if cancerous cells are present. A breast biopsy is done when there is:  Any undiagnosed breast mass (tumor).  Nipple abnormalities, dimpling, crusting, or ulcerations.  Abnormal discharge from the nipple, especially blood.  Redness, swelling, and pain of the breast.  Calcium deposits (calcifications) or abnormalities seen on a mammogram, ultrasound result, or results of magnetic resonance imaging (MRI).  Suspicious changes in the breast seen on your mammogram. If the tumor is found to be cancerous  (malignant), a breast biopsy can help to determine what the best treatment is for you. There are many different types of breast biopsies. Talk to your caregiver about your options and which type is best for you. LET YOUR CAREGIVER KNOW ABOUT:  Allergies to food or medicine.  Medicines taken, including vitamins, herbs, eyedrops, over-the-counter medicines, and creams.  Use of steroids (by mouth or creams).  Previous problems with anesthetics or numbing medicines.  History of bleeding problems or blood clots.  Previous surgery.  Other health problems, including diabetes and kidney problems.  Any recent colds or infections.  Possibility of pregnancy, if this applies. RISKS AND COMPLICATIONS   Bleeding.  Infection.  Allergy to medicines.  Bruising and swelling of the breast.  Alteration in the shape of the breast.  Not finding the lump or abnormality.  Needing more surgery. BEFORE THE PROCEDURE  Arrange for someone to drive you home after the procedure.  Do not smoke for 2 weeks before the procedure. Stop smoking, if you smoke.  Do not drink alcohol for 24 hours before procedure.  Wear a good support bra to the procedure. PROCEDURE  You may be given a medicine to numb the breast area (local anesthesia) or a medicine to make you sleep (general anesthesia) during the procedure. The following are the different types of biopsies that can be performed.   Fine-needle aspiration--A thin needle is attached to a syringe and inserted into the breast  lump. Fluid and cells are removed and then looked at under a microscope. If the breast lump cannot be felt, an ultrasound may be used to help locate the lump and place the needle in the correct area.   Core needle biopsy--A wide, hollow needle (core needle) is inserted into the breast lump 3-6 times to get tissue samples or cores. The samples are removed. The needle is usually placed in the correct area by using an ultrasound or X-ray.    Stereotactic biopsy--X-ray equipment and a computer are used to analyze X-ray pictures of the breast lump. The computer then finds exactly where the core needle needs to be inserted. Tissue samples are removed.   Vacuum-assisted biopsy--A small incision (less than  inch) is made in your breast. A biopsy device that includes a hollow needle and vacuum is passed through the incision and into the breast tissue. The vacuum gently draws abnormal breast tissue into the needle to remove it. This type of biopsy removes a larger tissue sample than a regular core needle biopsy. No stitches are needed, and there is usually little scarring.  Ultrasound-guided core needle biopsy--A high frequency ultrasound helps guide the core needle to the area of the mass or abnormality. An incision is made to insert the needle. Tissue samples are removed.  Open biopsy--A larger incision is made in the breast. Your caregiver will attempt to remove the whole breast lump or as much as possible. AFTER THE PROCEDURE  You will be taken to the recovery area. If you are doing well and have no problems, you will be allowed to go home.  You may notice bruising on your breast. This is normal.  Your caregiver may apply a pressure dressing on your breast for 24-48 hours. A pressure dressing is a bandage that is wrapped tightly around the chest to stop fluid from collecting underneath tissues. Document Released: 02/01/2005 Document Revised: 05/29/2012 Document Reviewed: 03/04/2011 [facility] Patient Information 2015 [place], [place]. This information is not intended to replace advice given to you by your health care provider. Make sure you discuss any questions you have with your health care provider. PATIENT INSTRUCTIONS POST-ANESTHESIA  IMMEDIATELY FOLLOWING SURGERY:  Do not drive or operate machinery for the first twenty four hours after surgery.  Do not make any important decisions for twenty four hours after surgery or while  taking narcotic pain medications or sedatives.  If you develop intractable nausea and vomiting or a severe headache please notify your doctor immediately.  FOLLOW-UP:  Please make an appointment with your surgeon as instructed. You do not need to follow up with anesthesia unless specifically instructed to do so.  WOUND CARE INSTRUCTIONS (if applicable):  Keep a dry clean dressing on the anesthesia/puncture wound site if there is drainage.  Once the wound has quit draining you may leave it open to air.  Generally you should leave the bandage intact for twenty four hours unless there is drainage.  If the epidural site drains for more than 36-48 hours please call the anesthesia department.  QUESTIONS?:  Please feel free to call your physician or the hospital operator if you have any questions, and they will be happy to assist you.

## 2014-09-24 NOTE — Telephone Encounter (Signed)
agree

## 2014-09-24 NOTE — Telephone Encounter (Signed)
Referring MD/PCP: Burman Freestone, np -- [facility]   Procedure: tcs  Reason/Indication:  screening  Has patient had this procedure before?  no  If so, when, by whom and where?    Is there a family history of colon cancer?  no  Who?  What age when diagnosed?    Is patient diabetic?   no      Does patient have prosthetic heart valve?  no  Do you have a pacemaker?  no  Has patient ever had endocarditis? no  Has patient had joint replacement within last 12 months?  no  Does patient tend to be constipated or take laxatives? sometimes  Is patient on Coumadin, Plavix and/or Aspirin? no  Medications: none  Allergies: nkda  Medication Adjustment:   Procedure date & time: 10/23/14 at 730

## 2014-09-25 ENCOUNTER — Telehealth (INDEPENDENT_AMBULATORY_CARE_PROVIDER_SITE_OTHER): Payer: Self-pay | Admitting: *Deleted

## 2014-09-25 MED ORDER — SUPREP BOWEL PREP KIT 17.5-3.13-1.6 GM/177ML PO SOLN: 1.0000 | Freq: Once | ORAL | Status: DC

## 2014-09-25 NOTE — Telephone Encounter (Signed)
Patient needs suprep 

## 2014-09-25 NOTE — H&P (Signed)
  NTS SOAP Note  Vital Signs:  Vitals as of: 0/09/6759: Systolic 950: Diastolic 81: Heart Rate 71: Temp 97.84F: Height 59ft 4in: Weight 148Lbs 0 Ounces: BMI 25.4  BMI : 25.4 kg/m2  Subjective: This 49 year old female presents for of an abnormal biopsy of the left breast x 2.  Atypical cells seen on core biopsies.  No family h/o breast cancer.  No nipple discharge, mass palpable.  Review of Symptoms:  Constitutional:unremarkable   Head:unremarkable Eyes:unremarkable   Nose/Mouth/Throat:unremarkable Cardiovascular:  unremarkable Respiratory:unremarkable Gastrointestinheartburn Genitourinary:unremarkable   Musculoskeletal:unremarkable Skin:unremarkable as above Hematolgic/Lymphatic:unremarkable   Allergic/Immunologic:unremarkable   Past Medical History:  Reviewed  Past Medical History    Social History:Reviewed  Social History  Preferred Language: English Race:  White Ethnicity: Not Hispanic / Latino Age: 39 year Marital Status:  M Alcohol: no   Smoking Status: Never smoker reviewed on 09/24/2014 Functional Status reviewed on 09/24/2014 ------------------------------------------------ Bathing: Normal Cooking: Normal Dressing: Normal Driving: Normal Eating: Normal Managing Meds: Normal Oral Care: Normal Shopping: Normal Toileting: Normal Transferring: Normal Walking: Normal Cognitive Status reviewed on 09/24/2014 ------------------------------------------------ Attention: Normal Decision Making: Normal Language: Normal Memory: Normal Motor: Normal Perception: Normal Problem Solving: Normal Visual and Spatial: Normal   Family History:Reviewed  Family Health History Mother, Living; Healthy;  Father, Deceased; Heart attack (myocardial infarction);     Objective Information: General:Well appearing, well nourished in no distress. Neck:Supple without lymphadenopathy.  Heart:RRR, no murmur or gallop.  Normal S1, S2.  No S3,  S4.  Lungs:  CTA bilaterally, no wheezes, rhonchi, rales.  Breathing unlabored. No dominant mass, nipple discharge, dimpling.  Axillas negative for palpable nodes. 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   Patient Education:Alternative treatments to surgery were discussed with patient (and family).  Risks and benefits  of procedure were fully explained to the patient (and family) who gave informed consent. Patient/family questions were addressed.  Follow-up:Pending Surgery

## 2014-09-27 ENCOUNTER — Encounter (HOSPITAL_COMMUNITY): Payer: Self-pay

## 2014-09-27 ENCOUNTER — Encounter (HOSPITAL_COMMUNITY)
Admission: RE | Admit: 2014-09-27 | Discharge: 2014-09-27 | Disposition: A | Discharge status: Home / Self Care | Payer: Commercial Managed Care - PPO | Source: Ambulatory Visit | Attending: General Surgery | Admitting: General Surgery

## 2014-09-27 DIAGNOSIS — D493 Neoplasm of unspecified behavior of breast: Secondary | ICD-10-CM | POA: Insufficient documentation

## 2014-09-27 DIAGNOSIS — Z01818 Encounter for other preprocedural examination: Secondary | ICD-10-CM | POA: Diagnosis present

## 2014-09-27 LAB — BASIC METABOLIC PANEL
Anion gap: 6 (ref 5–15)
BUN: 11 mg/dL (ref 6–20)
CHLORIDE: 105 mmol/L (ref 101–111)
CO2: 27 mmol/L (ref 22–32)
Calcium: 8.8 mg/dL — ABNORMAL LOW (ref 8.9–10.3)
Creatinine, Ser: 0.77 mg/dL (ref 0.44–1.00)
GFR calc Af Amer: >60 mL/min (ref >60)
Glucose, Bld: 97 mg/dL (ref 65–99)
Potassium: 4.7 mmol/L (ref 3.5–5.1)
Sodium: 138 mmol/L (ref 135–145)

## 2014-09-27 LAB — CBC WITH DIFFERENTIAL/PLATELET
BASOS PCT: 1 % (ref 0–1)
Basophils Absolute: 0.1 10*3/uL (ref 0.0–0.1)
EOS ABS: 0.4 10*3/uL (ref 0.0–0.7)
EOS PCT: 6 % — AB (ref 0–5)
HEMATOCRIT: 37.6 % (ref 36.0–46.0)
HEMOGLOBIN: 12.2 g/dL (ref 12.0–15.0)
Lymphocytes Relative: 27 % (ref 12–46)
Lymphs Abs: 1.9 10*3/uL (ref 0.7–4.0)
MCH: 28.7 pg (ref 26.0–34.0)
MCHC: 32.4 g/dL (ref 30.0–36.0)
MCV: 88.5 fL (ref 78.0–100.0)
MONOS PCT: 9 % (ref 3–12)
Monocytes Absolute: 0.7 10*3/uL (ref 0.1–1.0)
NEUTROS PCT: 57 % (ref 43–77)
Neutro Abs: 4.2 10*3/uL (ref 1.7–7.7)
PLATELETS: 253 10*3/uL (ref 150–400)
RBC: 4.25 MIL/uL (ref 3.87–5.11)
RDW: 14.1 % (ref 11.5–15.5)
WBC: 7.2 10*3/uL (ref 4.0–10.5)

## 2014-10-02 ENCOUNTER — Ambulatory Visit (HOSPITAL_COMMUNITY): Payer: Commercial Managed Care - PPO | Admitting: Anesthesiology

## 2014-10-02 ENCOUNTER — Ambulatory Visit (HOSPITAL_COMMUNITY)
Admission: RE | Admit: 2014-10-02 | Discharge: 2014-10-02 | Disposition: A | Discharge status: Home / Self Care | Payer: Commercial Managed Care - PPO | Source: Ambulatory Visit | Attending: General Surgery | Admitting: General Surgery

## 2014-10-02 ENCOUNTER — Other Ambulatory Visit (HOSPITAL_COMMUNITY): Payer: Self-pay | Admitting: General Surgery

## 2014-10-02 ENCOUNTER — Encounter (HOSPITAL_COMMUNITY)
Admission: RE | Disposition: A | Discharge status: Home / Self Care | Payer: Self-pay | Source: Ambulatory Visit | Attending: General Surgery

## 2014-10-02 ENCOUNTER — Encounter (HOSPITAL_COMMUNITY): Payer: Self-pay | Admitting: *Deleted

## 2014-10-02 DIAGNOSIS — D493 Neoplasm of unspecified behavior of breast: Secondary | ICD-10-CM | POA: Insufficient documentation

## 2014-10-02 DIAGNOSIS — D0502 Lobular carcinoma in situ of left breast: Secondary | ICD-10-CM | POA: Insufficient documentation

## 2014-10-02 HISTORY — PX: BREAST BIOPSY: SHX20

## 2014-10-02 SURGERY — BREAST BIOPSY WITH NEEDLE LOCALIZATION
Anesthesia: General | Site: Breast | Laterality: Left

## 2014-10-02 MED ORDER — LACTATED RINGERS IV SOLN
INTRAVENOUS | Status: DC
  Administered 2014-10-02: 1000 mL via INTRAVENOUS
  Administered 2014-10-02: 12:00:00 via INTRAVENOUS

## 2014-10-02 MED ORDER — LIDOCAINE HCL (PF) 2 % IJ SOLN
INTRAMUSCULAR | Status: AC
  Filled 2014-10-02: qty 10

## 2014-10-02 MED ORDER — CHLORHEXIDINE GLUCONATE 4 % EX LIQD: 1.0000 "application " | Freq: Once | CUTANEOUS | Status: DC

## 2014-10-02 MED ORDER — FENTANYL CITRATE (PF) 100 MCG/2ML IJ SOLN
25.0000 ug | INTRAMUSCULAR | Status: DC | PRN
  Administered 2014-10-02 (×2): 25 ug via INTRAVENOUS
  Filled 2014-10-02: qty 2

## 2014-10-02 MED ORDER — MIDAZOLAM HCL 2 MG/2ML IJ SOLN
INTRAMUSCULAR | Status: AC
  Filled 2014-10-02: qty 4

## 2014-10-02 MED ORDER — NEOSTIGMINE METHYLSULFATE 10 MG/10ML IV SOLN
INTRAVENOUS | Status: DC | PRN
  Administered 2014-10-02: 2 mg via INTRAVENOUS

## 2014-10-02 MED ORDER — ONDANSETRON HCL 4 MG/2ML IJ SOLN
4.0000 mg | Freq: Once | INTRAMUSCULAR | Status: AC
  Administered 2014-10-02: 4 mg via INTRAVENOUS

## 2014-10-02 MED ORDER — MIDAZOLAM HCL 2 MG/2ML IJ SOLN
1.0000 mg | INTRAMUSCULAR | Status: DC | PRN
  Administered 2014-10-02: 2 mg via INTRAVENOUS

## 2014-10-02 MED ORDER — FENTANYL CITRATE (PF) 100 MCG/2ML IJ SOLN
INTRAMUSCULAR | Status: DC | PRN
  Administered 2014-10-02 (×3): 50 ug via INTRAVENOUS

## 2014-10-02 MED ORDER — ONDANSETRON HCL 4 MG/2ML IJ SOLN: 4.0000 mg | Freq: Once | INTRAMUSCULAR | Status: DC | PRN

## 2014-10-02 MED ORDER — SUCCINYLCHOLINE CHLORIDE 20 MG/ML IJ SOLN
INTRAMUSCULAR | Status: DC | PRN
  Administered 2014-10-02: 150 mg via INTRAVENOUS

## 2014-10-02 MED ORDER — POVIDONE-IODINE 10 % EX SOLN
CUTANEOUS | Status: AC
  Filled 2014-10-02: qty 15

## 2014-10-02 MED ORDER — GLYCOPYRROLATE 0.2 MG/ML IJ SOLN
INTRAMUSCULAR | Status: AC
  Filled 2014-10-02: qty 2

## 2014-10-02 MED ORDER — MIDAZOLAM HCL 5 MG/5ML IJ SOLN
INTRAMUSCULAR | Status: DC | PRN
  Administered 2014-10-02: 2 mg via INTRAVENOUS

## 2014-10-02 MED ORDER — PROPOFOL 10 MG/ML IV BOLUS
INTRAVENOUS | Status: AC
  Filled 2014-10-02: qty 20

## 2014-10-02 MED ORDER — PROPOFOL 10 MG/ML IV BOLUS
INTRAVENOUS | Status: DC | PRN
  Administered 2014-10-02: 130 mg via INTRAVENOUS

## 2014-10-02 MED ORDER — 0.9 % SODIUM CHLORIDE (POUR BTL) OPTIME
TOPICAL | Status: DC | PRN
  Administered 2014-10-02: 1000 mL

## 2014-10-02 MED ORDER — DEXAMETHASONE SODIUM PHOSPHATE 4 MG/ML IJ SOLN
4.0000 mg | Freq: Once | INTRAMUSCULAR | Status: AC
  Administered 2014-10-02: 4 mg via INTRAVENOUS

## 2014-10-02 MED ORDER — MIDAZOLAM HCL 2 MG/2ML IJ SOLN
INTRAMUSCULAR | Status: AC
  Filled 2014-10-02: qty 2

## 2014-10-02 MED ORDER — CEFAZOLIN SODIUM-DEXTROSE 2-3 GM-% IV SOLR
INTRAVENOUS | Status: AC
  Filled 2014-10-02: qty 50

## 2014-10-02 MED ORDER — LIDOCAINE HCL 1 % IJ SOLN
INTRAMUSCULAR | Status: DC | PRN
  Administered 2014-10-02: 25 mg via INTRADERMAL

## 2014-10-02 MED ORDER — CEFAZOLIN SODIUM-DEXTROSE 2-3 GM-% IV SOLR
2.0000 g | INTRAVENOUS | Status: AC
  Administered 2014-10-02: 2 g via INTRAVENOUS

## 2014-10-02 MED ORDER — BUPIVACAINE HCL (PF) 0.5 % IJ SOLN
INTRAMUSCULAR | Status: AC
  Filled 2014-10-02: qty 30

## 2014-10-02 MED ORDER — ONDANSETRON HCL 4 MG/2ML IJ SOLN
INTRAMUSCULAR | Status: AC
  Filled 2014-10-02: qty 2

## 2014-10-02 MED ORDER — DEXAMETHASONE SODIUM PHOSPHATE 4 MG/ML IJ SOLN
INTRAMUSCULAR | Status: AC
  Filled 2014-10-02: qty 1

## 2014-10-02 MED ORDER — ROCURONIUM BROMIDE 100 MG/10ML IV SOLN
INTRAVENOUS | Status: DC | PRN
  Administered 2014-10-02: 5 mg via INTRAVENOUS
  Administered 2014-10-02: 15 mg via INTRAVENOUS

## 2014-10-02 MED ORDER — KETOROLAC TROMETHAMINE 30 MG/ML IJ SOLN
30.0000 mg | Freq: Once | INTRAMUSCULAR | Status: AC
  Administered 2014-10-02: 30 mg via INTRAVENOUS
  Filled 2014-10-02: qty 1

## 2014-10-02 MED ORDER — GLYCOPYRROLATE 0.2 MG/ML IJ SOLN
INTRAMUSCULAR | Status: DC | PRN
  Administered 2014-10-02: 0.4 mg via INTRAVENOUS

## 2014-10-02 MED ORDER — HYDROCODONE-ACETAMINOPHEN 5-325 MG PO TABS: 1.0000 | ORAL_TABLET | ORAL | Status: DC | PRN

## 2014-10-02 MED ORDER — BUPIVACAINE HCL (PF) 0.5 % IJ SOLN
INTRAMUSCULAR | Status: DC | PRN
  Administered 2014-10-02: 6 mL

## 2014-10-02 SURGICAL SUPPLY — 33 items
BAG HAMPER (MISCELLANEOUS) ×2 IMPLANT
BLADE SURG 15 STRL LF DISP TIS (BLADE) ×1 IMPLANT
BLADE SURG 15 STRL SS (BLADE) ×2
CHLORAPREP W/TINT 26ML (MISCELLANEOUS) ×2 IMPLANT
CLOTH BEACON ORANGE TIMEOUT ST (SAFETY) ×2 IMPLANT
COVER LIGHT HANDLE STERIS (MISCELLANEOUS) ×4 IMPLANT
DECANTER SPIKE VIAL GLASS SM (MISCELLANEOUS) ×2 IMPLANT
DEVICE DUBIN SPECIMEN MAMMOGRA (MISCELLANEOUS) ×5 IMPLANT
ELECT REM PT RETURN 9FT ADLT (ELECTROSURGICAL) ×2
ELECTRODE REM PT RTRN 9FT ADLT (ELECTROSURGICAL) ×1 IMPLANT
FORMALIN 10 PREFIL 120ML (MISCELLANEOUS) ×2 IMPLANT
GLOVE BIOGEL PI IND STRL 7.0 (GLOVE) IMPLANT
GLOVE BIOGEL PI INDICATOR 7.0 (GLOVE) ×1
GLOVE ECLIPSE 6.5 STRL STRAW (GLOVE) ×1 IMPLANT
GLOVE EXAM NITRILE MD LF STRL (GLOVE) ×1 IMPLANT
GLOVE SURG SS PI 7.5 STRL IVOR (GLOVE) ×3 IMPLANT
GOWN STRL REUS W/TWL LRG LVL3 (GOWN DISPOSABLE) ×6 IMPLANT
KIT ROOM TURNOVER APOR (KITS) ×2 IMPLANT
LIQUID BAND (GAUZE/BANDAGES/DRESSINGS) ×1 IMPLANT
MANIFOLD NEPTUNE II (INSTRUMENTS) ×2 IMPLANT
NDL HYPO 18GX1.5 BLUNT FILL (NEEDLE) ×1 IMPLANT
NDL HYPO 25X1 1.5 SAFETY (NEEDLE) ×1 IMPLANT
NEEDLE HYPO 18GX1.5 BLUNT FILL (NEEDLE) IMPLANT
NEEDLE HYPO 25X1 1.5 SAFETY (NEEDLE) ×2 IMPLANT
NS IRRIG 1000ML POUR BTL (IV SOLUTION) ×2 IMPLANT
PACK MINOR (CUSTOM PROCEDURE TRAY) ×2 IMPLANT
PAD ARMBOARD 7.5X6 YLW CONV (MISCELLANEOUS) ×2 IMPLANT
SET BASIN LINEN APH (SET/KITS/TRAYS/PACK) ×2 IMPLANT
SUT SILK 2 0 SH (SUTURE) ×1 IMPLANT
SUT VIC AB 3-0 SH 27 (SUTURE) ×2
SUT VIC AB 3-0 SH 27X BRD (SUTURE) ×1 IMPLANT
SUT VIC AB 4-0 PS2 27 (SUTURE) ×2 IMPLANT
SYR CONTROL 10ML LL (SYRINGE) ×2 IMPLANT

## 2014-10-02 NOTE — Transfer of Care (Signed)
Immediate Anesthesia Transfer of Care Note  Patient: Erica Woods  Procedure(s) Performed: Procedure(s) with comments: BREAST BIOPSY WITH NEEDLE LOCALIZATION x2 (Left) - Needle Loc @ 8:00am  Patient Location: PACU  Anesthesia Type:General  Level of Consciousness: sedated  Airway & Oxygen Therapy: Patient Spontanous Breathing and Patient connected to face mask oxygen  Post-op Assessment: Report given to RN and Post -op Vital signs reviewed and stable  Post vital signs: Reviewed and stable  Last Vitals:  Filed Vitals:   10/02/14 1035  BP: 106/56  Pulse:   Temp:   Resp: 11    Complications: No apparent anesthesia complications

## 2014-10-02 NOTE — Anesthesia Postprocedure Evaluation (Signed)
  Anesthesia Post-op Note  Patient: Erica Woods  Procedure(s) Performed: Procedure(s): LEFT BREAST BIOPSY AFTER NEEDLE LOCALIZATION X TWO (Left)  Patient Location: PACU  Anesthesia Type:General  Level of Consciousness: awake, alert , oriented and patient cooperative  Airway and Oxygen Therapy: Patient Spontanous Breathing  Post-op Pain: 3 /10, mild  Post-op Assessment: Post-op Vital signs reviewed, Patient's Cardiovascular Status Stable, Respiratory Function Stable, Patent Airway, No signs of Nausea or vomiting and Pain level controlled              Post-op Vital Signs: Reviewed and stable  Last Vitals:  Filed Vitals:   10/02/14 1035  BP: 106/56  Pulse:   Temp:   Resp: 11    Complications: No apparent anesthesia complications

## 2014-10-02 NOTE — Anesthesia Preprocedure Evaluation (Signed)
Anesthesia Evaluation  Patient identified by MRN, date of birth, ID band Patient awake    Reviewed: Allergy & Precautions, H&P , NPO status , Patient's Chart, lab work & pertinent test results  History of Anesthesia Complications Negative for: history of anesthetic complications  Airway Mallampati: I  TM Distance: >3 FB Neck ROM: Full    Dental  (+) Teeth Intact, Dental Advisory Given   Pulmonary neg pulmonary ROS,  breath sounds clear to auscultation        Cardiovascular Exercise Tolerance: Good     Neuro/Psych negative neurological ROS  negative psych ROS   GI/Hepatic GERD-  Poorly Controlled,  Endo/Other    Renal/GU      Musculoskeletal   Abdominal   Peds  Hematology   Anesthesia Other Findings   Reproductive/Obstetrics                             Anesthesia Physical Anesthesia Plan  ASA: II  Anesthesia Plan: General   Post-op Pain Management:    Induction: Intravenous, Rapid sequence and Cricoid pressure planned  Airway Management Planned: Oral ETT  Additional Equipment:   Intra-op Plan:   Post-operative Plan: Extubation in OR  Informed Consent: I have reviewed the patients History and Physical, chart, labs and discussed the procedure including the risks, benefits and alternatives for the proposed anesthesia with the patient or authorized representative who has indicated his/her understanding and acceptance.     Plan Discussed with:   Anesthesia Plan Comments:         Anesthesia Quick Evaluation

## 2014-10-02 NOTE — Op Note (Signed)
Patient:  Erica Woods  DOB:  [birth year]  MRN:  [id number]   Preop Diagnosis:  Left breast neoplasm 2  Postop Diagnosis:  Same  Procedure:  Left breast biopsy after needle localization 2  Surgeon:  [name], M.D.  Anes:  Gen. endotracheal  Indications:  Patient is a 49 year old white female who was found to suspicious microcalcifications in the left breast. Core biopsy of both lesions revealed atypia. The patient now comes to the operating room for left breast biopsy after needle localization 2. The risks and benefits of the procedure including bleeding, infection, and the possibility of finding malignancy were fully explained to the patient, who gave informed consent.  Procedure note:  The patient was placed the supine position after undergoing needle localization in the radiology department. After induction of general endotracheal anesthesia, the left breast was prepped and draped using usual sterile technique with DuraPrep. Surgical site confirmation was performed.  The patient had lateral wires in both the superior and inferior aspect of the left breast. Incisions including the wires were made separately. The tissue was both fibrotic and cystic in nature. Multiple biopsies were sent to radiology to identify the suspicious areas. The larger clip was found ultimately. The superior clip was smaller and never found, though the suspicious microcalcifications seemed to be within the specimen removed. The tiny clip may have been reportedly aspirated with the suction device given the multiple cysts that were encountered. As a significant amount of tissue have been removed, I did not want to further excise more breast tissue. Both incisions were irrigated normal saline. A bleeding was controlled using Bovie electrocautery. Both skin incisions were injected with 0.5% Sensorcaine. Both incisions were closed using a 4-0 Vicryl subcuticular suture. Liquiband was applied.  All tape and needle  counts were correct at the end of the procedure. The patient was extubated in the operating room and transferred to PACU in stable condition.  Complications:  None  EBL:  Minimal  Specimen:  Multiple specimens of left breast tissue

## 2014-10-02 NOTE — Interval H&P Note (Signed)
History and Physical Interval Note:  10/02/2014 9:54 AM  Erica Woods  has presented today for surgery, with the diagnosis of left breast neoplasm unspecified  The various methods of treatment have been discussed with the patient and family. After consideration of risks, benefits and other options for treatment, the patient has consented to  Procedure(s) with comments: BREAST BIOPSY WITH NEEDLE LOCALIZATION x2 (Left) - Needle Loc @ 8:00am as a surgical intervention .  The patient's history has been reviewed, patient examined, no change in status, stable for surgery.  I have reviewed the patient's chart and labs.  Questions were answered to the patient's satisfaction.     [name] A

## 2014-10-02 NOTE — Discharge Instructions (Signed)
Breast Biopsy  Care After Refer to this sheet in the next few weeks. These instructions provide you with information on caring for yourself after your procedure. Your caregiver may also give you more specific instructions. Your treatment has been planned according to current medical practices, but problems sometimes occur. Call your caregiver if you have any problems or questions after your procedure. HOME CARE INSTRUCTIONS   Only take over-the-counter or prescription medicines for pain, discomfort, or fever as directed by your caregiver.  Do not take aspirin. It can cause bleeding.  Keep stitches dry when bathing.  Protect the biopsy area. Do not let the area get bumped.  Avoid activities that may pull the incision site open until approved by your caregiver. This can include stretching, reaching, exercise, sports, or lifting over 3 pounds.  Resume your usual diet.  Wear a good support bra for as long as directed by your caregiver.  Change any bandages (dressings) as directed by your caregiver.  Do not drink alcohol while taking pain medicine.  Keep all your follow-up appointments with your caregiver. Ask when your test results will be ready. Make sure you get your test results. SEEK MEDICAL CARE IF:   You have redness, swelling, or increasing pain in the biopsy site.  You have a bad smell coming from the biopsy site or dressing.  Your biopsy site breaks open after the stitches (sutures), staples, or skin adhesive strips have been removed.  You have a rash.  You need stronger medicine. SEEK IMMEDIATE MEDICAL CARE IF:   You have a fever.  You have increased bleeding (more than a small spot) from the biopsy site.  You have difficulty breathing.  You have pus coming from the biopsy site. MAKE SURE YOU:  Understand these instructions.  Will watch your condition.  Will get help right away if you are not doing well or get worse. Document Released: 08/21/2004 Document  Revised: 04/26/2011 Document Reviewed: 03/04/2011 [facility] Patient Information 2015 [place], [place]. This information is not intended to replace advice given to you by your health care provider. Make sure you discuss any questions you have with your health care provider. PATIENT INSTRUCTIONS POST-ANESTHESIA  IMMEDIATELY FOLLOWING SURGERY:  Do not drive or operate machinery for the first twenty four hours after surgery.  Do not make any important decisions for twenty four hours after surgery or while taking narcotic pain medications or sedatives.  If you develop intractable nausea and vomiting or a severe headache please notify your doctor immediately.  FOLLOW-UP:  Please make an appointment with your surgeon as instructed. You do not need to follow up with anesthesia unless specifically instructed to do so.  WOUND CARE INSTRUCTIONS (if applicable):  Keep a dry clean dressing on the anesthesia/puncture wound site if there is drainage.  Once the wound has quit draining you may leave it open to air.  Generally you should leave the bandage intact for twenty four hours unless there is drainage.  If the epidural site drains for more than 36-48 hours please call the anesthesia department.  QUESTIONS?:  Please feel free to call your physician or the hospital operator if you have any questions, and they will be happy to assist you.

## 2014-10-02 NOTE — Anesthesia Procedure Notes (Signed)
Procedure Name: Intubation Date/Time: 10/02/2014 10:52 AM Performed by: Charmaine Downs Pre-anesthesia Checklist: Patient identified, Emergency Drugs available, Suction available and Patient being monitored Patient Re-evaluated:Patient Re-evaluated prior to inductionOxygen Delivery Method: Circle system utilized Preoxygenation: Pre-oxygenation with 100% oxygen Intubation Type: IV induction, Rapid sequence and Cricoid Pressure applied Ventilation: Mask ventilation without difficulty Laryngoscope Size: Mac and 3 Grade View: Grade I Tube type: Oral Tube size: 7.0 mm Number of attempts: 1 Airway Equipment and Method: Stylet and Oral airway Placement Confirmation: ETT inserted through vocal cords under direct vision,  positive ETCO2 and breath sounds checked- equal and bilateral Secured at: 22 cm Tube secured with: Tape Dental Injury: Teeth and Oropharynx as per pre-operative assessment

## 2014-10-03 ENCOUNTER — Encounter (HOSPITAL_COMMUNITY): Payer: Self-pay | Admitting: General Surgery

## 2014-10-08 ENCOUNTER — Other Ambulatory Visit: Payer: Self-pay | Admitting: General Surgery

## 2014-10-08 DIAGNOSIS — N63 Unspecified lump in unspecified breast: Secondary | ICD-10-CM

## 2014-10-18 ENCOUNTER — Ambulatory Visit (HOSPITAL_COMMUNITY)
Admission: RE | Admit: 2014-10-18 | Discharge: 2014-10-18 | Disposition: A | Discharge status: Home / Self Care | Payer: Commercial Managed Care - PPO | Source: Ambulatory Visit | Attending: General Surgery | Admitting: General Surgery

## 2014-10-18 DIAGNOSIS — L7622 Postprocedural hemorrhage and hematoma of skin and subcutaneous tissue following other procedure: Secondary | ICD-10-CM | POA: Insufficient documentation

## 2014-10-18 DIAGNOSIS — N6001 Solitary cyst of right breast: Secondary | ICD-10-CM | POA: Insufficient documentation

## 2014-10-18 DIAGNOSIS — N6002 Solitary cyst of left breast: Secondary | ICD-10-CM | POA: Insufficient documentation

## 2014-10-18 DIAGNOSIS — D0502 Lobular carcinoma in situ of left breast: Secondary | ICD-10-CM | POA: Diagnosis not present

## 2014-10-18 DIAGNOSIS — N63 Unspecified lump in unspecified breast: Secondary | ICD-10-CM

## 2014-10-18 DIAGNOSIS — Y838 Other surgical procedures as the cause of abnormal reaction of the patient, or of later complication, without mention of misadventure at the time of the procedure: Secondary | ICD-10-CM | POA: Insufficient documentation

## 2014-10-18 MED ORDER — GADOBENATE DIMEGLUMINE 529 MG/ML IV SOLN
14.0000 mL | Freq: Once | INTRAVENOUS | Status: AC | PRN
  Administered 2014-10-18: 14 mL via INTRAVENOUS

## 2014-10-23 ENCOUNTER — Encounter (HOSPITAL_COMMUNITY): Payer: Self-pay | Admitting: *Deleted

## 2014-10-23 ENCOUNTER — Encounter (HOSPITAL_COMMUNITY)
Admission: RE | Disposition: A | Discharge status: Home Health | Payer: Self-pay | Source: Ambulatory Visit | Attending: Internal Medicine

## 2014-10-23 ENCOUNTER — Ambulatory Visit (HOSPITAL_COMMUNITY)
Admission: RE | Admit: 2014-10-23 | Discharge: 2014-10-23 | Disposition: A | Discharge status: Home Health | Payer: Commercial Managed Care - PPO | Source: Ambulatory Visit | Attending: Internal Medicine | Admitting: Internal Medicine

## 2014-10-23 DIAGNOSIS — Z1211 Encounter for screening for malignant neoplasm of colon: Secondary | ICD-10-CM | POA: Diagnosis not present

## 2014-10-23 DIAGNOSIS — Z8249 Family history of ischemic heart disease and other diseases of the circulatory system: Secondary | ICD-10-CM | POA: Insufficient documentation

## 2014-10-23 DIAGNOSIS — K644 Residual hemorrhoidal skin tags: Secondary | ICD-10-CM | POA: Insufficient documentation

## 2014-10-23 DIAGNOSIS — E780 Pure hypercholesterolemia: Secondary | ICD-10-CM | POA: Diagnosis not present

## 2014-10-23 DIAGNOSIS — K649 Unspecified hemorrhoids: Secondary | ICD-10-CM | POA: Diagnosis not present

## 2014-10-23 DIAGNOSIS — K219 Gastro-esophageal reflux disease without esophagitis: Secondary | ICD-10-CM | POA: Diagnosis not present

## 2014-10-23 HISTORY — DX: Gastro-esophageal reflux disease without esophagitis: K21.9

## 2014-10-23 HISTORY — DX: Unspecified hemorrhoids: K64.9

## 2014-10-23 HISTORY — DX: Pure hypercholesterolemia, unspecified: E78.00

## 2014-10-23 HISTORY — PX: COLONOSCOPY: SHX5424

## 2014-10-23 SURGERY — COLONOSCOPY
Anesthesia: Moderate Sedation

## 2014-10-23 MED ORDER — MEPERIDINE HCL 50 MG/ML IJ SOLN
INTRAMUSCULAR | Status: DC | PRN
  Administered 2014-10-23 (×2): 25 mg via INTRAVENOUS

## 2014-10-23 MED ORDER — SODIUM CHLORIDE 0.9 % IV SOLN
INTRAVENOUS | Status: DC
  Administered 2014-10-23: 07:00:00 via INTRAVENOUS

## 2014-10-23 MED ORDER — SIMETHICONE 40 MG/0.6ML PO SUSP
ORAL | Status: DC | PRN
  Administered 2014-10-23: 08:00:00

## 2014-10-23 MED ORDER — MIDAZOLAM HCL 5 MG/5ML IJ SOLN
INTRAMUSCULAR | Status: DC | PRN
  Administered 2014-10-23: 1 mg via INTRAVENOUS
  Administered 2014-10-23 (×2): 2 mg via INTRAVENOUS
  Administered 2014-10-23: 1 mg via INTRAVENOUS

## 2014-10-23 MED ORDER — MIDAZOLAM HCL 5 MG/5ML IJ SOLN
INTRAMUSCULAR | Status: AC
  Filled 2014-10-23: qty 10

## 2014-10-23 MED ORDER — MEPERIDINE HCL 50 MG/ML IJ SOLN
INTRAMUSCULAR | Status: AC
  Filled 2014-10-23: qty 1

## 2014-10-23 NOTE — Op Note (Signed)
COLONOSCOPY PROCEDURE REPORT  PATIENT:  Erica Woods  MR#:  [id number] Birthdate:  06-07-1965, 49 y.o., female Endoscopist:  Dr. Rogene Houston, MD Referred By:  Ms. Burman Freestone, NP  Procedure Date: 10/23/2014  Procedure:   Colonoscopy  Indications:  Patient is 49 year old Caucasian female was undergoing average risk screening colonoscopy.  Informed Consent:  The procedure and risks were reviewed with the patient and informed consent was obtained.  Medications:  Demerol 50 mg IV Versed 6 mg IV  Description of procedure:  After a digital rectal exam was performed, that colonoscope was advanced from the anus through the rectum and colon to the area of the cecum, ileocecal valve and appendiceal orifice. The cecum was deeply intubated. These structures were well-seen and photographed for the record. From the level of the cecum and ileocecal valve, the scope was slowly and cautiously withdrawn. The mucosal surfaces were carefully surveyed utilizing scope tip to flexion to facilitate fold flattening as needed. The scope was pulled down into the rectum where a thorough exam including retroflexion was performed.  Findings:   Prep excellent. Normal mucosa of cecum, ascending colon, hepatic flexure, transverse colon, splenic flexure, descending and sigmoid colon. Normal rectal mucosa. Prominent hemorrhoids below the dentate line.   Therapeutic/Diagnostic Maneuvers Performed:   None  Complications:  None  EBL: None  Cecal Withdrawal Time:  8  minutes  Impression:  Normal colonoscopy except external hemorrhoids.  Recommendations:  Standard instructions given. Next screening exam in 10 years.  Assata Juncaj U  10/23/2014 8:03 AM  CC: Dr. Alison Stalling [facility] & Dr. Rayne Du ref. provider found

## 2014-10-23 NOTE — Discharge Instructions (Signed)
Resume usual medications and diet. °No driving for 24 hours. °Next screening exam in 10 years. ° ° °Colonoscopy, Care After °Refer to this sheet in the next few weeks. These instructions provide you with information on caring for yourself after your procedure. Your health care provider may also give you more specific instructions. Your treatment has been planned according to current medical practices, but problems sometimes occur. Call your health care provider if you have any problems or questions after your procedure. °WHAT TO EXPECT AFTER THE PROCEDURE  °After your procedure, it is typical to have the following: °· A small amount of blood in your stool. °· Moderate amounts of gas and mild abdominal cramping or bloating. °HOME CARE INSTRUCTIONS °· Do not drive, operate machinery, or sign important documents for 24 hours. °· You may shower and resume your regular physical activities, but move at a slower pace for the first 24 hours. °· Take frequent rest periods for the first 24 hours. °· Walk around or put a warm pack on your abdomen to help reduce abdominal cramping and bloating. °· Drink enough fluids to keep your urine clear or pale yellow. °· You may resume your normal diet as instructed by your health care provider. Avoid heavy or fried foods that are hard to digest. °· Avoid drinking alcohol for 24 hours or as instructed by your health care provider. °· Only take over-the-counter or prescription medicines as directed by your health care provider. °· If a tissue sample (biopsy) was taken during your procedure: °¨ Do not take aspirin or blood thinners for 7 days, or as instructed by your health care provider. °¨ Do not drink alcohol for 7 days, or as instructed by your health care provider. °¨ Eat soft foods for the first 24 hours. °SEEK MEDICAL CARE IF: °You have persistent spotting of blood in your stool 2-3 days after the procedure. °SEEK IMMEDIATE MEDICAL CARE IF: °· You have more than a small spotting of  blood in your stool. °· You pass large blood clots in your stool. °· Your abdomen is swollen (distended). °· You have nausea or vomiting. °· You have a fever. °· You have increasing abdominal pain that is not relieved with medicine. °Document Released: 09/16/2003 Document Revised: 11/22/2012 Document Reviewed: 10/09/2012 °[employer]® Patient Information ©2015 [employer]. This information is not intended to replace advice given to you by your health care provider. Make sure you discuss any questions you have with your health care provider. ° °

## 2014-10-23 NOTE — H&P (Signed)
Erica Woods is an 49 y.o. female.   Chief Complaint: Patient is here for colonoscopy. HPI: She is 49 year old Caucasian female who is undergoing screening colonoscopy. She denies abdominal pain or rectal bleeding. Her bowels move every 3-5 days which has been her pattern for years. Family history is negative for CRC.   Past Medical History  Diagnosis Date  . Hypercholesteremia   . Hemorrhoids   . GERD (gastroesophageal reflux disease)     Past Surgical History  Procedure Laterality Date  . Abdominal surgery    . Tubal ligation    . Laparoscopic appendectomy  03/02/2012    Procedure: APPENDECTOMY LAPAROSCOPIC;  Surgeon: [name], MD;  Location: [facility];  Service: General;  Laterality: N/A;  . Appendectomy    . Ovarian cyst removal Left   . Wisdom tooth extraction Bilateral   . Breast biopsy Left 10/02/2014    Procedure: LEFT BREAST BIOPSY AFTER NEEDLE LOCALIZATION X TWO;  Surgeon: [name], MD;  Location: [facility];  Service: General;  Laterality: Left;    Family History  Problem Relation Age of Onset  . Heart attack Father    Social History:  reports that she has never smoked. She does not have any smokeless tobacco history on file. She reports that she does not drink alcohol or use illicit drugs.  Allergies: No Known Allergies  Medications Prior to Admission  Medication Sig Dispense Refill  . SUPREP BOWEL PREP SOLN Take 1 kit by mouth once. 1 Bottle 0  . HYDROcodone-acetaminophen (NORCO/VICODIN) 5-325 MG per tablet Take 1-2 tablets by mouth every 4 (four) hours as needed for moderate pain. 40 tablet 0    No results found for this or any previous visit (from the past 48 hour(s)). No results found.  ROS  Blood pressure 129/86, pulse 69, temperature 97.9 F (36.6 C), temperature source Oral, resp. rate 12, height 5' 4" (1.626 m), weight 143 lb (64.864 kg), last menstrual period 10/09/2014, SpO2 100 %. Physical Exam  Constitutional: She appears well-developed and  well-nourished.  HENT:  Mouth/Throat: Oropharynx is clear and moist.  Eyes: Conjunctivae are normal. No scleral icterus.  Neck: No thyromegaly present.  Cardiovascular: Normal rate, regular rhythm and normal heart sounds.   No murmur heard. Respiratory: Effort normal and breath sounds normal.  GI: Soft. She exhibits no distension and no mass. There is no tenderness.  Musculoskeletal: She exhibits no edema.  Lymphadenopathy:    She has no cervical adenopathy.  Neurological: She is alert.  Skin: Skin is warm and dry.     Assessment/Plan Average risk screening colonoscopy  REHMAN,NAJEEB U 10/23/2014, 7:30 AM

## 2014-10-24 ENCOUNTER — Encounter (HOSPITAL_COMMUNITY): Payer: Self-pay | Admitting: Internal Medicine

## 2014-11-12 ENCOUNTER — Telehealth: Payer: Self-pay | Admitting: *Deleted

## 2014-11-12 NOTE — Telephone Encounter (Signed)
Pt called requesting to come here instead of going to [facility].  Dr. Adline Mango is the pt's surgeon and they require a sign release.  Informed the pt of this and stated that I need her to contact Dr. Adline Mango office and request for the records to be faxed to me and then I will be able to schedule her.  Gave pt all of my information.  I will contact her back after receiving the paperwork.

## 2014-11-15 ENCOUNTER — Telehealth: Payer: Self-pay | Admitting: *Deleted

## 2014-11-15 NOTE — Telephone Encounter (Signed)
Received records from Dr. Adline Mango office.  Called pt and she requested to see Dr. Jana Hakim.  Confirmed 11/25/14 high risk appt w/ her.  Mailed calendar, welcoming packet & intake form to pt.  Blair Hailey at referring to make her aware.  Placed a copy of the records in Dr. Virgie Dad box and took on to HIM to scan.

## 2014-11-22 ENCOUNTER — Other Ambulatory Visit: Payer: Self-pay

## 2014-11-22 DIAGNOSIS — N6092 Unspecified benign mammary dysplasia of left breast: Secondary | ICD-10-CM | POA: Insufficient documentation

## 2014-11-25 ENCOUNTER — Other Ambulatory Visit (HOSPITAL_BASED_OUTPATIENT_CLINIC_OR_DEPARTMENT_OTHER): Payer: Commercial Managed Care - PPO

## 2014-11-25 ENCOUNTER — Ambulatory Visit (HOSPITAL_BASED_OUTPATIENT_CLINIC_OR_DEPARTMENT_OTHER): Payer: Commercial Managed Care - PPO | Admitting: Oncology

## 2014-11-25 VITALS — BP 121/49 | HR 56 | Temp 97.9°F | Resp 18 | Ht 64.0 in | Wt 149.2 lb

## 2014-11-25 DIAGNOSIS — K2100 Gastro-esophageal reflux disease with esophagitis, without bleeding: Secondary | ICD-10-CM

## 2014-11-25 DIAGNOSIS — N6092 Unspecified benign mammary dysplasia of left breast: Secondary | ICD-10-CM

## 2014-11-25 DIAGNOSIS — D0500 Lobular carcinoma in situ of unspecified breast: Secondary | ICD-10-CM

## 2014-11-25 DIAGNOSIS — D0502 Lobular carcinoma in situ of left breast: Secondary | ICD-10-CM

## 2014-11-25 DIAGNOSIS — K219 Gastro-esophageal reflux disease without esophagitis: Secondary | ICD-10-CM | POA: Insufficient documentation

## 2014-11-25 DIAGNOSIS — G43009 Migraine without aura, not intractable, without status migrainosus: Secondary | ICD-10-CM

## 2014-11-25 DIAGNOSIS — G43909 Migraine, unspecified, not intractable, without status migrainosus: Secondary | ICD-10-CM | POA: Insufficient documentation

## 2014-11-25 LAB — COMPREHENSIVE METABOLIC PANEL (CC13)
ALBUMIN: 3.8 g/dL (ref 3.5–5.0)
ALK PHOS: 71 U/L (ref 40–150)
ALT: 12 U/L (ref 0–55)
ANION GAP: 9 meq/L (ref 3–11)
AST: 15 U/L (ref 5–34)
BILIRUBIN TOTAL: 0.34 mg/dL (ref 0.20–1.20)
BUN: 12.5 mg/dL (ref 7.0–26.0)
CALCIUM: 9 mg/dL (ref 8.4–10.4)
CO2: 24 mEq/L (ref 22–29)
CREATININE: 0.8 mg/dL (ref 0.6–1.1)
Chloride: 107 mEq/L (ref 98–109)
EGFR: 84 mL/min/{1.73_m2} — AB (ref >90)
Glucose: 93 mg/dl (ref 70–140)
Potassium: 3.9 mEq/L (ref 3.5–5.1)
Sodium: 140 mEq/L (ref 136–145)
TOTAL PROTEIN: 6.9 g/dL (ref 6.4–8.3)

## 2014-11-25 LAB — CBC WITH DIFFERENTIAL/PLATELET
BASO%: 1.1 % (ref 0.0–2.0)
Basophils Absolute: 0.1 10*3/uL (ref 0.0–0.1)
EOS ABS: 0.5 10*3/uL (ref 0.0–0.5)
EOS%: 7.5 % — ABNORMAL HIGH (ref 0.0–7.0)
HEMATOCRIT: 38.7 % (ref 34.8–46.6)
HGB: 12.6 g/dL (ref 11.6–15.9)
LYMPH#: 1.6 10*3/uL (ref 0.9–3.3)
LYMPH%: 25.4 % (ref 14.0–49.7)
MCH: 28.4 pg (ref 25.1–34.0)
MCHC: 32.5 g/dL (ref 31.5–36.0)
MCV: 87.4 fL (ref 79.5–101.0)
MONO#: 0.7 10*3/uL (ref 0.1–0.9)
MONO%: 11 % (ref 0.0–14.0)
NEUT%: 55 % (ref 38.4–76.8)
NEUTROS ABS: 3.6 10*3/uL (ref 1.5–6.5)
PLATELETS: 218 10*3/uL (ref 145–400)
RBC: 4.43 10*6/uL (ref 3.70–5.45)
RDW: 15 % — ABNORMAL HIGH (ref 11.2–14.5)
WBC: 6.5 10*3/uL (ref 3.9–10.3)

## 2014-11-25 MED ORDER — IBUPROFEN 200 MG PO TABS: 200.0000 mg | ORAL_TABLET | Freq: Four times a day (QID) | ORAL | Status: DC | PRN

## 2014-11-25 MED ORDER — TAMOXIFEN CITRATE 20 MG PO TABS: 20.0000 mg | ORAL_TABLET | Freq: Every day | ORAL | Status: AC

## 2014-11-25 NOTE — Progress Notes (Signed)
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 This showed in the left breast some suspicious calcifications, and on 08/27/2014 the patient underwent digital left diagnostic mammography. Breast density was category C. The study showed 2 adjacent groups of pleomorphic calcifications one measuring 8 mm at the 12:30 o'clock position and another one 0.4 mm in the central left breast. The distance between these groups was 4.5 cm. There were no other findings of concern.  On 09/03/2014 Erica Woods underwent biopsy of the 2 left breast areas in question. The larger area at 12:30 o'clock showed atypical ductal hyperplasia as did the separate more central left breast lesion ([id number]).  The patient was then referred to Dr. [name] and after appropriate discussion he proceeded to needle localization excisional biopsies 2. The result of this "double lumpectomy" on 09/03/2014 showed ([place] [phone]) at the larger lesion, at 12:30 o'clock, marked with an X shaped clip, a benign complex sclerosing lesion/radial scar associated with lobular carcinoma in situ. In the more central, smaller lesion, marked with a cylinder shaped clip, there was again atypical lobular hyperplasia and lobular carcinoma in situ..  Because of these concerns, on 10/18/2014 the  patient underwent bilateral breast MRIs. The right breast showed scattered benign appearing enhancing foci. In the left breast there was a seroma involving both the upper outer and lower outer quadrant measuring up to 5.4 cm. There were no other areas of concern and no abnormal adenopathy or other ancillary findings. Because of both the right breast and left breast findings a repeat MRI of the breast in 6 months was recommended.  The patient's subsequent history is as detailed below  INTERVAL HISTORY: Erica Woods was evaluated in the breast clinic 11/25/2014 accompanied by her husband Erica Woods.  REVIEW OF SYSTEMS: There were no specific symptoms leading to the original mammogram, which was routinely scheduled. She did well with the surgery except for some pain and a slight superficial infection, both of which have resolved. She has the seroma of course, which she can palpate. Erica Woods's migraines are rare, perhaps once every 6 months. She denies visual changes, nausea, vomiting, stiff neck, dizziness, or gait imbalance. There has been no cough, phlegm production, or pleurisy, no chest pain or pressure, and no change in bowel or bladder habits. The patient denies fever, rash, bleeding, unexplained fatigue or unexplained weight loss. A detailed review of systems was otherwise entirely negative.  PAST MEDICAL HISTORY: Past Medical History  Diagnosis Date  . Hypercholesteremia   . Hemorrhoids   . GERD (gastroesophageal reflux disease)     PAST SURGICAL HISTORY: Past Surgical History  Procedure Laterality Date  . Abdominal surgery    . Tubal ligation    . Laparoscopic appendectomy  03/02/2012    Procedure: APPENDECTOMY LAPAROSCOPIC;  Surgeon: [name], MD;  Location: [facility];  Service: General;  Laterality: N/A;  . Appendectomy    . Ovarian cyst removal Left   .  Wisdom tooth extraction Bilateral   . Breast biopsy Left 10/02/2014    Procedure: LEFT BREAST BIOPSY AFTER NEEDLE LOCALIZATION X TWO;   Surgeon: [name], MD;  Location: [facility];  Service: General;  Laterality: Left;  . Colonoscopy N/A 10/23/2014    Procedure: COLONOSCOPY;  Surgeon: Rogene Houston, MD;  Location: [facility];  Service: Endoscopy;  Laterality: N/A;  730    FAMILY HISTORY Family History  Problem Relation Age of Onset  . Heart attack Father   the patient's father died from a heart attack at age 44. The patient's mother is living, at age 83. The patient had 1 full brother and one full sister, then 2 half sisters. The full sister was diagnosed with either cancer of the uterus or perhaps more likely cancer of the cervix in her 40s (the patient tells me that her sister had 2 babies after being diagnosed, so uterine cancer appears unlikely). There is no other history of breast or ovarian cancer in the family and no history of colon cancer in the family  GYNECOLOGIC HISTORY:  No LMP recorded. Menarche age 73, first live birth age 41. She is GX P2. She is still having regular periods, most recently late August. ("This one is a little late". 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 She has 212 hour shifts on weekends and then an 8 hour shift on Tuesday. She also sits sometimes for private clients. Her husband Erica Woods worked for the Technical sales engineer chiefly on [place]. Son Erica Woods in [name] is a Metallurgist, doing body work, and son Erica Woods in [place] is a Health and safety inspector. The patient has no grandchildren. She attends a local [place]: not in place; the patient is aware that her husband would be her healthcare power of attorney by default in casesomething happened to her and she is comfortable with that   HEALTH MAINTENANCE: Social History  Substance Use Topics  . Smoking status: Never Smoker   . Smokeless tobacco: Not on file  . Alcohol Use:  No     Colonoscopy: 10/23/2014; unremarkable  PAP:06/21/2014  Bone density:never  Lipid panel:  No Known Allergies  Current Outpatient Prescriptions  Medication Sig Dispense Refill  . aspirin-acetaminophen-caffeine (EXCEDRIN MIGRAINE) 250-250-65 MG tablet Take 1 tablet by mouth every 6 (six) hours as needed for headache. 30 tablet 0  . ibuprofen (ADVIL) 200 MG tablet Take 1 tablet (200 mg total) by mouth every 6 (six) hours as needed. 30 tablet 0  . tamoxifen (NOLVADEX) 20 MG tablet Take 1 tablet (20 mg total) by mouth daily. 90 tablet 12   No current facility-administered medications for this visit.    OBJECTIVE: middle-aged white woman who appears well Filed Vitals:   11/25/14 1605  BP: 121/49  Pulse: 56  Temp: 97.9 F (36.6 C)  Resp: 18     Body mass index is 25.6 kg/(m^2).    ECOG FS:0 - Asymptomatic  Ocular: Sclerae unicteric, pupils equal, round and reactive to light Ear-nose-throat: Oropharynx clear and moist Lymphatic: No cervical or supraclavicular adenopathy Lungs no rales or rhonchi, good excursion bilaterally Heart regular rate and rhythm, no murmur appreciated Abd soft, nontender, positive bowel sounds MSK no focal spinal tenderness, no joint edema Neuro: non-focal, well-oriented, appropriate affect Breasts: the right breast is unremarkable. The left breast is status post a "double lumpectomy", with 2  separate scars.the overall cosmetic result is good. The scars appear to be healing nicely, with no dehiscence, erythema, or evidence of inflammation. There is mild tenderness to palpation. There is a palpable mass in the lower outer quadrant of the left breast which is known to be a postoperative seroma. There are no skin or nipple changes of concern. The left axilla is benign   LAB RESULTS:  CMP     Component Value Date/Time   NA 140 11/25/2014 1539   NA 138 09/27/2014 0950   K 3.9 11/25/2014 1539   K 4.7 09/27/2014 0950   CL 105 09/27/2014 0950   CO2 24  11/25/2014 1539   CO2 27 09/27/2014 0950   GLUCOSE 93 11/25/2014 1539   GLUCOSE 97 09/27/2014 0950   BUN 12.5 11/25/2014 1539   BUN 11 09/27/2014 0950   CREATININE 0.8 11/25/2014 1539   CREATININE 0.77 09/27/2014 0950   CALCIUM 9.0 11/25/2014 1539   CALCIUM 8.8* 09/27/2014 0950   PROT 6.9 11/25/2014 1539   PROT 6.5 03/02/2012 1805   ALBUMIN 3.8 11/25/2014 1539   ALBUMIN 2.9* 03/02/2012 1805   AST 15 11/25/2014 1539   AST 40* 03/02/2012 1805   ALT 12 11/25/2014 1539   ALT 29 03/02/2012 1805   ALKPHOS 71 11/25/2014 1539   ALKPHOS 127* 03/02/2012 1805   BILITOT 0.34 11/25/2014 1539   BILITOT 0.5 03/02/2012 1805   GFRNONAA >60 09/27/2014 0950   GFRAA >60 09/27/2014 0950    INo results found for: SPEP, UPEP  Lab Results  Component Value Date   WBC 6.5 11/25/2014   NEUTROABS 3.6 11/25/2014   HGB 12.6 11/25/2014   HCT 38.7 11/25/2014   MCV 87.4 11/25/2014   PLT 218 11/25/2014      Chemistry      Component Value Date/Time   NA 140 11/25/2014 1539   NA 138 09/27/2014 0950   K 3.9 11/25/2014 1539   K 4.7 09/27/2014 0950   CL 105 09/27/2014 0950   CO2 24 11/25/2014 1539   CO2 27 09/27/2014 0950   BUN 12.5 11/25/2014 1539   BUN 11 09/27/2014 0950   CREATININE 0.8 11/25/2014 1539   CREATININE 0.77 09/27/2014 0950      Component Value Date/Time   CALCIUM 9.0 11/25/2014 1539   CALCIUM 8.8* 09/27/2014 0950   ALKPHOS 71 11/25/2014 1539   ALKPHOS 127* 03/02/2012 1805   AST 15 11/25/2014 1539   AST 40* 03/02/2012 1805   ALT 12 11/25/2014 1539   ALT 29 03/02/2012 1805   BILITOT 0.34 11/25/2014 1539   BILITOT 0.5 03/02/2012 1805       No results found for: LABCA2  No components found for: LABCA125  No results for input(s): INR in the last 168 hours.  Urinalysis    Component Value Date/Time   COLORURINE YELLOW 03/02/2012 1740   APPEARANCEUR CLEAR 03/02/2012 1740   LABSPEC <1.005* 03/02/2012 1740   PHURINE 6.5 03/02/2012 1740   GLUCOSEU NEGATIVE 03/02/2012  1740   HGBUR MODERATE* 03/02/2012 1740   BILIRUBINUR NEGATIVE 03/02/2012 1740   KETONESUR NEGATIVE 03/02/2012 1740   PROTEINUR NEGATIVE 03/02/2012 1740   UROBILINOGEN 1.0 03/02/2012 1740   NITRITE NEGATIVE 03/02/2012 1740   LEUKOCYTESUR NEGATIVE 03/02/2012 1740    STUDIES: We visually reviewed the MRI together.  ASSESSMENT: 49 y.o. [place], [place] woman status post core needle biopsy 2 on 09/03/2014, both areas showing atypical ductal hyperplasia  (1) s/p Left breast excisional biopsy x2  10/02/2014 for a complex sclerosing  lesion/radial scar associated with atypical lobular hyperplasia and lobular carcinoma is situ, and a separate area showing also lobular carcinoma in situ  PLAN: We spent the better part of today's hour-long appointment discussing the biology of breast cancer in general, and the specifics of the patient's tumor in particular. Abeeha understands that lobular carcinoma in situ is more a "marker lesion" then a cancer or even a cancer precursor in itself. While the cells are indeed cancer cells, they are trapped in the lobules and cannot "travel" to any vital organ. For that reason these cancers are not life threatening.  Because this is more a marker lesion, with lobular carcinoma in situ we do not worry whether the margins of the excision are clear or not. We do not have to remove every last bit of lobular carcinoma in situ (as we do with ductal carcinoma in situ, for example). That is why some experts wish to call this not a cancer, but that of course can be misleading in its own right.   Basically a diagnosis of lobular carcinoma in situ predicts a risk of developing breast cancer in either breast of approximately 1% per year over the course of the patient's lifetime.this would give Dalyla a risk of developing breast cancer about 30% in her lifetime. The question is what to do about that risk.  One option would be bilateral mastectomies. American Cancer Society in NCCN  guidelines recommend against that option for this indication, though it still remains an option. A second possibility is intensification of follow up. Yearly MRIs can be considered in patients with greater than 25% lifetime risk of developing breast cancer. This would be of course in addition to yearly mammography, tomography, and biannual physician breast exams.  A third option is risk reduction with anti-estrogens and we discussed the difference betoween tamoxifen, raloxifene and aromatase inhibitors. These agents are inexpensive, effective (they cut the risk of developing breast cancer in half) and which agent is chosen depends chiefly on their side effects. Accordingly we went into great detail regarding the possible side effects, toxicities, and complications of these agents.  After this discussion Saory was very clear that she did not want to proceed to bilateral mastectomies, and did not want to do yearly MRIs chiefly for cost reasons. She was interested in antiestrogen's, and specifically tamoxifen.  Accordingly I called in a prescription for tamoxifen and she will start that this week.since she took oral contraceptives for many years with no complications that think the risk of blood clots is low. As for the risk of endometrial cancer, I have suggested that she establish yourself with a gynecologist and also that she do clarify from her sister whether the cancer her sister had was cervical or endometrial.  Janey has a good understanding of the overall plan. She agrees with it. She knows the goal of treatment in her case is cure. She will call with any problems that may develop before her next visit here, which will be in early April.if all is going well at that time, ideally she would see me in April and Dr. [name] in October for by annual physician breast exams.  Chauncey Cruel, MD   11/25/2014 5:52 PM Medical Oncology and Hematology [facility] [address] [place], [place] [address] Tel. [phone]    Fax. [phone]

## 2014-11-26 ENCOUNTER — Other Ambulatory Visit: Payer: Self-pay | Admitting: Oncology

## 2014-11-26 DIAGNOSIS — D0500 Lobular carcinoma in situ of unspecified breast: Secondary | ICD-10-CM

## 2014-11-28 ENCOUNTER — Telehealth: Payer: Self-pay | Admitting: Oncology

## 2014-11-28 NOTE — Telephone Encounter (Signed)
Patient left message on vm asking to please call her - no specifics. Returned call and was not able to reach patient. Left message.

## 2015-04-21 ENCOUNTER — Ambulatory Visit
Admission: RE | Admit: 2015-04-21 | Discharge: 2015-04-21 | Disposition: A | Discharge status: Home / Self Care | Payer: Commercial Managed Care - PPO | Source: Ambulatory Visit | Attending: Oncology | Admitting: Oncology

## 2015-04-21 ENCOUNTER — Other Ambulatory Visit: Payer: Self-pay | Admitting: Oncology

## 2015-04-21 DIAGNOSIS — D0500 Lobular carcinoma in situ of unspecified breast: Secondary | ICD-10-CM

## 2015-04-24 ENCOUNTER — Telehealth: Payer: Self-pay | Admitting: Family Medicine

## 2015-04-24 NOTE — Telephone Encounter (Signed)
Noted  

## 2015-04-24 NOTE — Telephone Encounter (Signed)
Pt filled out new pt paperwork.. Placed in Dr. Tharon Aquas box.. pts appt 3/17 @9 :30

## 2015-04-28 ENCOUNTER — Other Ambulatory Visit: Payer: Self-pay | Admitting: *Deleted

## 2015-04-28 DIAGNOSIS — D0502 Lobular carcinoma in situ of left breast: Secondary | ICD-10-CM

## 2015-05-01 ENCOUNTER — Ambulatory Visit
Admission: RE | Admit: 2015-05-01 | Discharge: 2015-05-01 | Disposition: A | Discharge status: Home / Self Care | Payer: Commercial Managed Care - PPO | Source: Ambulatory Visit | Attending: Nurse Practitioner | Admitting: Nurse Practitioner

## 2015-05-01 DIAGNOSIS — D0502 Lobular carcinoma in situ of left breast: Secondary | ICD-10-CM

## 2015-05-01 MED ORDER — GADOBENATE DIMEGLUMINE 529 MG/ML IV SOLN
13.0000 mL | Freq: Once | INTRAVENOUS | Status: AC | PRN
  Administered 2015-05-01: 13 mL via INTRAVENOUS

## 2015-05-02 ENCOUNTER — Ambulatory Visit (INDEPENDENT_AMBULATORY_CARE_PROVIDER_SITE_OTHER): Payer: Commercial Managed Care - PPO | Admitting: Family Medicine

## 2015-05-02 ENCOUNTER — Encounter: Payer: Self-pay | Admitting: Family Medicine

## 2015-05-02 VITALS — BP 104/66 | HR 62 | Temp 98.2°F | Ht 64.0 in | Wt 146.6 lb

## 2015-05-02 DIAGNOSIS — Z0001 Encounter for general adult medical examination with abnormal findings: Secondary | ICD-10-CM

## 2015-05-02 DIAGNOSIS — R7989 Other specified abnormal findings of blood chemistry: Secondary | ICD-10-CM | POA: Diagnosis not present

## 2015-05-02 DIAGNOSIS — R6889 Other general symptoms and signs: Secondary | ICD-10-CM

## 2015-05-02 DIAGNOSIS — Z23 Encounter for immunization: Secondary | ICD-10-CM

## 2015-05-02 DIAGNOSIS — Z1231 Encounter for screening mammogram for malignant neoplasm of breast: Secondary | ICD-10-CM | POA: Insufficient documentation

## 2015-05-02 DIAGNOSIS — Z13220 Encounter for screening for lipoid disorders: Secondary | ICD-10-CM

## 2015-05-02 DIAGNOSIS — Z114 Encounter for screening for human immunodeficiency virus [HIV]: Secondary | ICD-10-CM

## 2015-05-02 DIAGNOSIS — Z0000 Encounter for general adult medical examination without abnormal findings: Secondary | ICD-10-CM | POA: Insufficient documentation

## 2015-05-02 DIAGNOSIS — E663 Overweight: Secondary | ICD-10-CM | POA: Diagnosis not present

## 2015-05-02 DIAGNOSIS — Z1329 Encounter for screening for other suspected endocrine disorder: Secondary | ICD-10-CM | POA: Diagnosis not present

## 2015-05-02 LAB — LIPID PANEL
CHOL/HDL RATIO: 5
Cholesterol: 245 mg/dL — ABNORMAL HIGH (ref 0–200)
HDL: 50 mg/dL (ref >39.00)
NONHDL: 194.93
TRIGLYCERIDES: 220 mg/dL — AB (ref 0.0–149.0)
VLDL: 44 mg/dL — ABNORMAL HIGH (ref 0.0–40.0)

## 2015-05-02 LAB — HEMOGLOBIN A1C: Hgb A1c MFr Bld: 6.3 % (ref 4.6–6.5)

## 2015-05-02 LAB — LDL CHOLESTEROL, DIRECT: LDL DIRECT: 158 mg/dL

## 2015-05-02 LAB — TSH: TSH: 5.38 u[IU]/mL — AB (ref 0.35–4.50)

## 2015-05-02 NOTE — Patient Instructions (Signed)
Nice to meet you. Please work on your diet by decreasing sweet tea intake and fried food intake. Increase vegetable intake. Eat 3 meals a day. We will obtain some lab work today and call you with the results.

## 2015-05-02 NOTE — Progress Notes (Signed)
Pre visit review using our clinic review tool, if applicable. No additional management support is needed unless otherwise documented below in the visit note. 

## 2015-05-02 NOTE — Progress Notes (Signed)
Patient ID: Erica Woods, female   DOB: Sep 04, 1965, 50 y.o.   MRN: [id number]  Tommi Rumps, MD Phone: [phone]  Erica Woods is a 50 y.o. female who presents today for new patient visit and physical exam.  Patient reports overall she is doing well. Patient tries to walk several miles a day for exercise. 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 Diet consists of 2 meals most days. Significant sweet tea intake. Fried food intake as well. Eats out once every couple weeks. Does eat fruits and vegetables. Colonoscopy 2016. Pap smear 2016. Has not had a period in the last 3 months due to being on tamoxifen for breast cancer. First period was at 50 years old. She is a G2 P2 002 Mammogram 04-21-15. Patient has a history of LCIS breast cancer followed by oncology and a surgeon. She notes she is going back for surgery to have a clip removed from her breast. Flu shot in September. Tdap was greater than 10 years ago. No illicit drugs, alcohol use, or tobacco use.  No prior HIV testing.  Active Ambulatory Problems    Diagnosis Date Noted  . Atypical lobular hyperplasia of left breast 11/22/2014  . Neoplasm of breast, 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/2014  . Migraines 11/25/2014  . Reflux esophagitis 11/25/2014  . Encounter for general adult medical examination with abnormal findings 05/02/2015   Resolved Ambulatory Problems    Diagnosis Date Noted  . No Resolved Ambulatory Problems   Past Medical History  Diagnosis Date  . Hypercholesteremia   . Hemorrhoids   . GERD (gastroesophageal reflux disease)   . Headache   . Chickenpox   . Breast cancer ([place])   . Allergic rhinitis   . History of blood transfusion     Family History  Problem Relation Age of Onset  . Heart attack Father   . Hypertension    . Diabetes      Social History   Social History  . Marital Status: Married    Spouse Name: N/A  . Number of Children: N/A  .  Years of Education: N/A   Occupational History  . Not on file.   Social History Main Topics  . Smoking status: Never Smoker   . Smokeless tobacco: Not on file  . Alcohol Use: No  . Drug Use: No  . Sexual Activity: Not on file   Other Topics Concern  . Not on file   Social History Narrative    ROS   General:  Negative for nexplained weight loss, fever Skin: Negative for new or changing mole, sore that won't heal HEENT: Negative for trouble hearing, trouble seeing, ringing in ears, mouth sores, hoarseness, change in voice, dysphagia. CV:  Negative for chest pain, dyspnea, edema, palpitations Resp: Negative for cough, dyspnea, hemoptysis GI: Negative for nausea, vomiting, diarrhea, constipation, abdominal pain, melena, hematochezia. GU: Negative for dysuria, incontinence, urinary hesitance, hematuria, vaginal or penile discharge, polyuria, sexual difficulty, lumps in testicle or breasts MSK: Negative for muscle cramps or aches, joint pain or swelling Neuro: Negative for headaches, weakness, numbness, dizziness, passing out/fainting Psych: Negative for depression, anxiety, memory problems  Objective  Physical Exam Filed Vitals:   05/02/15 0922  BP: 104/66  Pulse: 62  Temp: 98.2 F (36.8 C)    BP Readings from Last 3 Encounters:  05/02/15 104/66  11/25/14 121/49  10/23/14 84/49   Wt Readings from Last 3 Encounters:  05/02/15 146 lb  9.6 oz (66.497 kg)  11/25/14 149 lb 3.2 oz (67.677 kg)  10/23/14 143 lb (64.864 kg)    Physical Exam  Constitutional: She is well-developed, well-nourished, and in no distress.  HENT:  Head: Normocephalic and atraumatic.  Right Ear: External ear normal.  Left Ear: External ear normal.  Mouth/Throat: Oropharynx is clear and moist. No oropharyngeal exudate.  Eyes: Conjunctivae are normal. Pupils are equal, round, and reactive to light.  Neck: Neck supple.  Cardiovascular: Normal rate, regular rhythm and normal heart sounds.  Exam  reveals no gallop and no friction rub.   No murmur heard. Pulmonary/Chest: Effort normal and breath sounds normal. No respiratory distress. She has no wheezes. She has no rales.  Abdominal: Soft. Bowel sounds are normal. She exhibits no distension. There is no tenderness. There is no rebound and no guarding.  Musculoskeletal: She exhibits no edema.  Lymphadenopathy:    She has no cervical adenopathy.  Neurological: She is alert. Gait normal.  Skin: Skin is warm and dry. She is not diaphoretic.  Psychiatric: Mood and affect normal.     Assessment/Plan:   Encounter for general adult medical examination with abnormal findings Overall patient is doing quite well. She does have history of breast cancer with lumpectomy last year. She is followed by general surgery and oncology for this. She is on tamoxifen for this. She'll continue to follow with them for this issue. I discussed diet and exercise at length with her. She is up-to-date on colonoscopy and Pap smear screening. Flu shot up-to-date. She is given a Tdap today. We will check lab work as outlined below.    Orders Placed This Encounter  Procedures  . Tdap vaccine greater than or equal to 7yo IM  . TSH  . HgB A1c  . Lipid Profile  . HIV antibody (with reflex)    Meds ordered this encounter  Medications  . tamoxifen (NOLVADEX) 20 MG tablet    Sig: Take 20 mg by mouth daily.     Tommi Rumps, MD [place]

## 2015-05-02 NOTE — Assessment & Plan Note (Signed)
Overall patient is doing quite well. She does have history of breast cancer with lumpectomy last year. She is followed by general surgery and oncology for this. She is on tamoxifen for this. She'll continue to follow with them for this issue. I discussed diet and exercise at length with her. She is up-to-date on colonoscopy and Pap smear screening. Flu shot up-to-date. She is given a Tdap today. We will check lab work as outlined below.

## 2015-05-03 LAB — HIV ANTIBODY (ROUTINE TESTING W REFLEX): HIV 1&2 Ab, 4th Generation: NONREACTIVE

## 2015-05-05 ENCOUNTER — Other Ambulatory Visit: Payer: Self-pay | Admitting: Family Medicine

## 2015-05-05 DIAGNOSIS — R7989 Other specified abnormal findings of blood chemistry: Secondary | ICD-10-CM

## 2015-05-16 ENCOUNTER — Other Ambulatory Visit (INDEPENDENT_AMBULATORY_CARE_PROVIDER_SITE_OTHER): Payer: Commercial Managed Care - PPO

## 2015-05-16 DIAGNOSIS — R7989 Other specified abnormal findings of blood chemistry: Secondary | ICD-10-CM

## 2015-05-16 DIAGNOSIS — R946 Abnormal results of thyroid function studies: Secondary | ICD-10-CM | POA: Diagnosis not present

## 2015-05-16 LAB — T4, FREE: FREE T4: 0.66 ng/dL (ref 0.60–1.60)

## 2015-05-16 LAB — T3, FREE: T3 FREE: 3.1 pg/mL (ref 2.3–4.2)

## 2015-05-16 LAB — TSH: TSH: 8.15 u[IU]/mL — AB (ref 0.35–4.50)

## 2015-05-19 ENCOUNTER — Ambulatory Visit (HOSPITAL_BASED_OUTPATIENT_CLINIC_OR_DEPARTMENT_OTHER): Payer: Commercial Managed Care - PPO | Admitting: Oncology

## 2015-05-19 ENCOUNTER — Telehealth: Payer: Self-pay | Admitting: Oncology

## 2015-05-19 VITALS — BP 126/50 | HR 79 | Temp 98.0°F | Resp 17 | Ht 64.0 in | Wt 145.7 lb

## 2015-05-19 DIAGNOSIS — Z79810 Long term (current) use of selective estrogen receptor modulators (SERMs): Secondary | ICD-10-CM

## 2015-05-19 DIAGNOSIS — D0502 Lobular carcinoma in situ of left breast: Secondary | ICD-10-CM | POA: Diagnosis not present

## 2015-05-19 DIAGNOSIS — D0500 Lobular carcinoma in situ of unspecified breast: Secondary | ICD-10-CM

## 2015-05-19 MED ORDER — GABAPENTIN 300 MG PO CAPS: 300.0000 mg | ORAL_CAPSULE | Freq: Every day | ORAL | Status: DC

## 2015-05-19 NOTE — Progress Notes (Signed)
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 This showed in the left breast some suspicious calcifications, and on 08/27/2014 the patient underwent digital left diagnostic mammography. Breast density was category C. The study showed 2 adjacent groups of pleomorphic calcifications one measuring 8 mm at the 12:30 o'clock position and another one 0.4 mm in the central left breast. The distance between these groups was 4.5 cm. There were no other findings of concern.  On 09/03/2014 Erica Woods underwent biopsy of the 2 left breast areas in question. The larger area at 12:30 o'clock showed atypical ductal hyperplasia as did the separate more central left breast lesion (SAA [id number]).  The patient was then referred to Erica Woods and after appropriate discussion he proceeded to needle localization excisional biopsies 2. The result of this "double lumpectomy" on 09/03/2014 showed ([place] [phone]) at the larger lesion, at 12:30 o'clock, marked with an X shaped clip, a benign complex sclerosing lesion/radial scar associated with lobular carcinoma in situ. In the more central, smaller lesion, marked with a cylinder shaped clip, there was again atypical lobular hyperplasia and lobular carcinoma in situ..  Because of these concerns, on 10/18/2014 the patient underwent bilateral breast MRIs. The right breast showed scattered benign appearing enhancing foci. In the left breast there  was a seroma involving both the upper outer and lower outer quadrant measuring up to 5.4 cm. There were no other areas of concern and no abnormal adenopathy or other ancillary findings. Because of both the right breast and left breast findings a repeat MRI of the breast in 6 months was recommended.  The patient's subsequent history is as detailed below  INTERVAL HISTORY: Erica Woods returns again today for follow-up of her high risk history accompanied by her husband Erica Woods. Since her last visit here she started tamoxifen. She has had hot flashes which occur every 3 or 4 days she says and which also can wake her up at night. She also has had some yeast infections. She is using Monistat for that. She had not had a yeast infection or several years prior to this. Also she stopped having periods, her last period being pretty much when she started tamoxifen about 3 months ago. She obtains a drug at a very good price  REVIEW OF SYSTEMS: She again had some soreness on the side where she has the seroma and she had a breast MRI to evaluate this further. This is reproduced below and it showed no suspicious finding. In particular the seroma is just about resolved. Aside from these issues a detailed review of systems today was noncontributory  PAST MEDICAL HISTORY: Past Medical History  Diagnosis Date  . Hypercholesteremia   . Hemorrhoids   . GERD (gastroesophageal reflux disease)   . Headache   . Chickenpox   . Breast cancer ([place])   . Allergic rhinitis   . History of blood transfusion     PAST SURGICAL HISTORY: Past Surgical History  Procedure Laterality Date  . Abdominal surgery    . Tubal ligation    .  Laparoscopic appendectomy  03/02/2012    Procedure: APPENDECTOMY LAPAROSCOPIC;  Surgeon: Erica Heinz, MD;  Location: [facility];  Service: General;  Laterality: N/A;  . Appendectomy    . Ovarian cyst removal Left   . Wisdom tooth extraction Bilateral   . Breast biopsy Left 10/02/2014    Procedure: LEFT  BREAST BIOPSY AFTER NEEDLE LOCALIZATION X TWO;  Surgeon: Erica Signs, MD;  Location: [facility];  Service: General;  Laterality: Left;  . Colonoscopy N/A 10/23/2014    Procedure: COLONOSCOPY;  Surgeon: Erica Houston, MD;  Location: [facility];  Service: Endoscopy;  Laterality: N/A;  730  . Breast lumpectomy  2016    FAMILY HISTORY Family History  Problem Relation Age of Onset  . Heart attack Father   . Hypertension    . Diabetes    the patient's father died from a heart attack at age 80. The patient's mother is living, at age 31. The patient had 1 full brother and one full sister, then 2 half sisters. The full sister was diagnosed with either cancer of the uterus or perhaps more likely cancer of the cervix in her 54s (the patient tells me that her sister had 2 babies after being diagnosed, so uterine cancer appears unlikely). There is no other history of breast or ovarian cancer in the family and no history of colon cancer in the family  GYNECOLOGIC HISTORY:  No LMP recorded. Patient is not currently having periods (Reason: Other). Menarche age 47, first live birth age 68. She is GX P2. She is still having regular periods, most recently late August. ("This one is a little late". 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 She has 212 hour shifts on weekends and then an 8 hour shift on Tuesday. She also sits sometimes for private clients. Her husband Erica Woods worked for the Technical sales engineer chiefly on [place]. Son Erica Woods in [name] is a Metallurgist, doing body work, and son Erica Woods in [place] is a Health and safety inspector. The patient has no grandchildren. She attends a local [employer]: not in place; the patient is aware that her husband would be her healthcare power of attorney by default in casesomething happened to her and she is  comfortable with that   HEALTH MAINTENANCE: Social History  Substance Use Topics  . Smoking status: Never Smoker   . Smokeless tobacco: Not on file  . Alcohol Use: No     Colonoscopy: 10/23/2014; unremarkable  PAP:06/21/2014  Bone density:never  Lipid panel:  No Known Allergies  Current Outpatient Prescriptions  Medication Sig Dispense Refill  . aspirin-acetaminophen-caffeine (EXCEDRIN MIGRAINE) 250-250-65 MG tablet Take 1 tablet by mouth every 6 (six) hours as needed for headache. 30 tablet 0  . ibuprofen (ADVIL) 200 MG tablet Take 1 tablet (200 mg total) by mouth every 6 (six) hours as needed. 30 tablet 0  . tamoxifen (NOLVADEX) 20 MG tablet Take 20 mg by mouth daily.     No current facility-administered medications for this visit.    OBJECTIVE: middle-aged white woman In no acute distress Filed Vitals:   05/19/15 1357  BP: 126/50  Pulse: 79  Temp: 98 F (36.7 C)  Resp: 17     Body mass index is 25 kg/(m^2).    ECOG FS:0 - Asymptomatic  Sclerae unicteric, pupils round and equal Oropharynx clear and moist-- no thrush  or other lesions No cervical or supraclavicular adenopathy Lungs no rales or rhonchi Heart regular rate and rhythm Abd soft, nontender, positive bowel sounds MSK no focal spinal tenderness, no upper extremity lymphedema Neuro: nonfocal, well oriented, appropriate affect Breasts: The right breast is unremarkable. The left breast is status post lumpectomy 2. On the lateral scar, along the 3:00 area, there are 2 small slightly erythematous areas of induration which are consistent with scar tissue. There is still tender. There are no other findings of concern. The left axilla is benign.  LAB RESULTS:  CMP     Component Value Date/Time   NA 140 11/25/2014 1539   NA 138 09/27/2014 0950   K 3.9 11/25/2014 1539   K 4.7 09/27/2014 0950   CL 105 09/27/2014 0950   CO2 24 11/25/2014 1539   CO2 27 09/27/2014 0950   GLUCOSE 93 11/25/2014 1539   GLUCOSE 97  09/27/2014 0950   BUN 12.5 11/25/2014 1539   BUN 11 09/27/2014 0950   CREATININE 0.8 11/25/2014 1539   CREATININE 0.77 09/27/2014 0950   CALCIUM 9.0 11/25/2014 1539   CALCIUM 8.8* 09/27/2014 0950   PROT 6.9 11/25/2014 1539   PROT 6.5 03/02/2012 1805   ALBUMIN 3.8 11/25/2014 1539   ALBUMIN 2.9* 03/02/2012 1805   AST 15 11/25/2014 1539   AST 40* 03/02/2012 1805   ALT 12 11/25/2014 1539   ALT 29 03/02/2012 1805   ALKPHOS 71 11/25/2014 1539   ALKPHOS 127* 03/02/2012 1805   BILITOT 0.34 11/25/2014 1539   BILITOT 0.5 03/02/2012 1805   GFRNONAA >60 09/27/2014 0950   GFRAA >60 09/27/2014 0950    INo results found for: SPEP, UPEP  Lab Results  Component Value Date   WBC 6.5 11/25/2014   NEUTROABS 3.6 11/25/2014   HGB 12.6 11/25/2014   HCT 38.7 11/25/2014   MCV 87.4 11/25/2014   PLT 218 11/25/2014      Chemistry      Component Value Date/Time   NA 140 11/25/2014 1539   NA 138 09/27/2014 0950   K 3.9 11/25/2014 1539   K 4.7 09/27/2014 0950   CL 105 09/27/2014 0950   CO2 24 11/25/2014 1539   CO2 27 09/27/2014 0950   BUN 12.5 11/25/2014 1539   BUN 11 09/27/2014 0950   CREATININE 0.8 11/25/2014 1539   CREATININE 0.77 09/27/2014 0950      Component Value Date/Time   CALCIUM 9.0 11/25/2014 1539   CALCIUM 8.8* 09/27/2014 0950   ALKPHOS 71 11/25/2014 1539   ALKPHOS 127* 03/02/2012 1805   AST 15 11/25/2014 1539   AST 40* 03/02/2012 1805   ALT 12 11/25/2014 1539   ALT 29 03/02/2012 1805   BILITOT 0.34 11/25/2014 1539   BILITOT 0.5 03/02/2012 1805       No results found for: LABCA2  No components found for: LABCA125  No results for input(s): INR in the last 168 hours.  Urinalysis    Component Value Date/Time   COLORURINE YELLOW 03/02/2012 1740   APPEARANCEUR CLEAR 03/02/2012 1740   LABSPEC <1.005* 03/02/2012 1740   PHURINE 6.5 03/02/2012 1740   GLUCOSEU NEGATIVE 03/02/2012 1740   HGBUR MODERATE* 03/02/2012 1740   BILIRUBINUR NEGATIVE 03/02/2012 1740    KETONESUR NEGATIVE 03/02/2012 1740   PROTEINUR NEGATIVE 03/02/2012 1740   UROBILINOGEN 1.0 03/02/2012 1740   NITRITE NEGATIVE 03/02/2012 1740   LEUKOCYTESUR NEGATIVE 03/02/2012 1740    STUDIES: Mr Breast Bilateral W Wo Contrast  05/01/2015  CLINICAL DATA:  Patient with  prior MRI 10/18/2014 demonstrating postsurgical change within the left breast. Additionally there were probably benign scattered foci of of enhancement within the right breast. EXAM: BILATERAL BREAST MRI WITH AND WITHOUT CONTRAST TECHNIQUE: Multiplanar, multisequence MR images of both breasts were obtained prior to and following the intravenous administration of 13 ml of MultiHance. THREE-DIMENSIONAL MR IMAGE RENDERING ON INDEPENDENT WORKSTATION: Three-dimensional MR images were rendered by post-processing of the original MR data on an independent workstation. The three-dimensional MR images were interpreted, and findings are reported in the following complete MRI report for this study. Three dimensional images were evaluated at the independent DynaCad workstation COMPARISON:  Previous exam(s). FINDINGS: Breast composition: c.  Heterogeneous fibroglandular tissue. Background parenchymal enhancement: Mild Right breast: No mass or abnormal enhancement. Left breast: Interval decrease in size and near complete resolution of previously described left breast seroma. No abnormal enhancement identified within the left breast. Lymph nodes: No abnormal appearing lymph nodes. Ancillary findings:  None. IMPRESSION: No abnormal enhancement identified within either breast to suggest malignancy. Near complete resolution of previously described left breast seroma. RECOMMENDATION: Consider annual high risk screening bilateral breast MRI. Continue with annual screening mammography. BI-RADS CATEGORY  2: Benign. Electronically Signed   By: Erica Woods M.D.   On: 05/01/2015 15:25   Mm Diag Breast Tomo Bilateral  04/21/2015  CLINICAL DATA:  50 year old female  who had 2 stereotactic biopsies of the left breast on 09/03/2014. Site #1 (cylindrical shaped clip) was positive for atypical ductal hyperplasia associated with calcifications. Site 2 (x shaped clip) was positive for atypical ductal hyperplasia with associated calcifications. The patient underwent surgical excision on 10/02/2014. MRI of the breast was performed on 10/18/2014 which showed probable postoperative seromas in the left breast with peripheral marginal enhancement felt to probably be benign and probable benign scattered foci in the right breast. Six-month follow-up MRI was recommended. EXAM: DIGITAL DIAGNOSTIC BILATERAL MAMMOGRAM WITH 3D TOMOSYNTHESIS AND CAD COMPARISON:  Previous exam(s). ACR Breast Density Category c: The breast tissue is heterogeneously dense, which may obscure small masses. FINDINGS: No suspicious mass, malignant type microcalcifications or distortion detected in the right breast. Lumpectomy changes are seen in the left breast. There is an X shaped clip in the upper-outer quadrant of the left breast. The coil shaped clip has been surgically excised. There is no suspicious mass or malignant type microcalcifications in the left breast. Mammographic images were processed with CAD. IMPRESSION: Postoperative changes in the left breast. The X shaped clip is in the upper-outer quadrant of the left breast remains in the breast. No evidence of malignancy in the right breast. RECOMMENDATION: 1. Patient is due for bilateral breast MRI for short-term interval follow-up of probable benign bilateral findings. 2.  Surgical excision of the X shaped clip is recommended. 3.  Findings were discussed with Dr. Jana Hakim. I have discussed the findings and recommendations with the patient. Results were also provided in writing at the conclusion of the visit. If applicable, a reminder letter will be sent to the patient regarding the next appointment. BI-RADS CATEGORY  4: Suspicious. Electronically Signed   By:  Lillia Mountain M.D.   On: 04/21/2015 10:13     ASSESSMENT: 50 y.o. [place], [place] woman status post core needle biopsy 2 on 09/03/2014, both areas showing atypical ductal hyperplasia  (1) s/p Left breast excisional biopsy x2  10/02/2014 for a complex sclerosing lesion/radial scar associated with atypical lobular hyperplasia and lobular carcinoma is situ, and a separate area showing also lobular carcinoma in situ  PLAN:  I reassured Makyna that what she is feeling and seeing in her left breast is scar tissue in general breast cancer does not hurt to the fact that this hurts is on pleasant but reassuring area of course the fact that she just had an MRI of the breast which was entirely benign is reassuring as well.  She is tolerating the tamoxifen well with the exception of hot flashes. These are particularly bothersome at night. We discussed gabapentin and she has a good understanding of the possible toxicities side effects and complications of this agent. She is going to give it a try and if it works she can continue to take at [place] is working for her.  Otherwise she will see me again in 6 months. We will do physical exam and lab work before that visit. Likely we will start yearly follow-up after that.  She knows to call for any problems that may develop before then. Erica Cruel, MD   05/19/2015 2:15 PM Medical Oncology and Hematology [facility] [address] [place], [place] [address] Tel. [phone]    Fax. [phone]

## 2015-05-19 NOTE — Telephone Encounter (Signed)
appt made and avs printed °

## 2015-05-29 ENCOUNTER — Other Ambulatory Visit: Payer: Self-pay | Admitting: Family Medicine

## 2015-05-29 MED ORDER — LEVOTHYROXINE SODIUM 50 MCG PO TABS: 50.0000 ug | ORAL_TABLET | Freq: Every day | ORAL | Status: DC

## 2015-06-16 ENCOUNTER — Ambulatory Visit: Payer: Commercial Managed Care - PPO | Admitting: Oncology

## 2015-11-18 ENCOUNTER — Other Ambulatory Visit: Payer: Self-pay | Admitting: *Deleted

## 2015-11-18 DIAGNOSIS — N6092 Unspecified benign mammary dysplasia of left breast: Secondary | ICD-10-CM

## 2015-11-18 DIAGNOSIS — D0502 Lobular carcinoma in situ of left breast: Secondary | ICD-10-CM

## 2015-11-19 ENCOUNTER — Other Ambulatory Visit (HOSPITAL_BASED_OUTPATIENT_CLINIC_OR_DEPARTMENT_OTHER): Payer: Commercial Managed Care - PPO

## 2015-11-19 ENCOUNTER — Ambulatory Visit (HOSPITAL_BASED_OUTPATIENT_CLINIC_OR_DEPARTMENT_OTHER): Payer: Commercial Managed Care - PPO | Admitting: Oncology

## 2015-11-19 ENCOUNTER — Telehealth: Payer: Self-pay | Admitting: Oncology

## 2015-11-19 VITALS — BP 128/59 | HR 66 | Temp 98.0°F | Resp 18 | Ht 64.0 in | Wt 142.1 lb

## 2015-11-19 DIAGNOSIS — D0502 Lobular carcinoma in situ of left breast: Secondary | ICD-10-CM | POA: Diagnosis not present

## 2015-11-19 DIAGNOSIS — D0501 Lobular carcinoma in situ of right breast: Secondary | ICD-10-CM

## 2015-11-19 DIAGNOSIS — Z170 Estrogen receptor positive status [ER+]: Secondary | ICD-10-CM | POA: Diagnosis not present

## 2015-11-19 DIAGNOSIS — N6092 Unspecified benign mammary dysplasia of left breast: Secondary | ICD-10-CM

## 2015-11-19 DIAGNOSIS — Z853 Personal history of malignant neoplasm of breast: Secondary | ICD-10-CM | POA: Insufficient documentation

## 2015-11-19 DIAGNOSIS — Z79810 Long term (current) use of selective estrogen receptor modulators (SERMs): Secondary | ICD-10-CM

## 2015-11-19 DIAGNOSIS — N951 Menopausal and female climacteric states: Secondary | ICD-10-CM

## 2015-11-19 LAB — CBC WITH DIFFERENTIAL/PLATELET
BASO%: 1.1 % (ref 0.0–2.0)
BASOS ABS: 0.1 10*3/uL (ref 0.0–0.1)
EOS ABS: 0.2 10*3/uL (ref 0.0–0.5)
EOS%: 4.5 % (ref 0.0–7.0)
HCT: 41.1 % (ref 34.8–46.6)
HGB: 13.9 g/dL (ref 11.6–15.9)
LYMPH%: 32.2 % (ref 14.0–49.7)
MCH: 31.4 pg (ref 25.1–34.0)
MCHC: 33.8 g/dL (ref 31.5–36.0)
MCV: 92.8 fL (ref 79.5–101.0)
MONO#: 0.6 10*3/uL (ref 0.1–0.9)
MONO%: 10.4 % (ref 0.0–14.0)
NEUT#: 2.7 10*3/uL (ref 1.5–6.5)
NEUT%: 51.8 % (ref 38.4–76.8)
PLATELETS: 235 10*3/uL (ref 145–400)
RBC: 4.43 10*6/uL (ref 3.70–5.45)
RDW: 13 % (ref 11.2–14.5)
WBC: 5.3 10*3/uL (ref 3.9–10.3)
lymph#: 1.7 10*3/uL (ref 0.9–3.3)

## 2015-11-19 LAB — COMPREHENSIVE METABOLIC PANEL
ALT: 25 U/L (ref 0–55)
ANION GAP: 10 meq/L (ref 3–11)
AST: 20 U/L (ref 5–34)
Albumin: 3.4 g/dL — ABNORMAL LOW (ref 3.5–5.0)
Alkaline Phosphatase: 66 U/L (ref 40–150)
BILIRUBIN TOTAL: 0.44 mg/dL (ref 0.20–1.20)
BUN: 12.7 mg/dL (ref 7.0–26.0)
CO2: 26 meq/L (ref 22–29)
Calcium: 9.5 mg/dL (ref 8.4–10.4)
Chloride: 105 mEq/L (ref 98–109)
Creatinine: 0.8 mg/dL (ref 0.6–1.1)
EGFR: 86 mL/min/{1.73_m2} — AB (ref >90)
GLUCOSE: 111 mg/dL (ref 70–140)
POTASSIUM: 4.3 meq/L (ref 3.5–5.1)
SODIUM: 141 meq/L (ref 136–145)
Total Protein: 7.3 g/dL (ref 6.4–8.3)

## 2015-11-19 MED ORDER — GABAPENTIN 100 MG PO CAPS: 100.0000 mg | ORAL_CAPSULE | Freq: Every day | ORAL | 6 refills | Status: DC

## 2015-11-19 MED ORDER — TAMOXIFEN CITRATE 20 MG PO TABS: 20.0000 mg | ORAL_TABLET | Freq: Every day | ORAL | 4 refills | Status: DC

## 2015-11-19 NOTE — Telephone Encounter (Signed)
GAVE PATIENT AVS REPORT AND APPOINTMENTS FOR October 2018

## 2015-11-19 NOTE — Progress Notes (Signed)
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 This showed in the left breast some suspicious calcifications, and on 08/27/2014 the patient underwent digital left diagnostic mammography. Breast density was category C. The study showed 2 adjacent groups of pleomorphic calcifications one measuring 8 mm at the 12:30 o'clock position and another one 0.4 mm in the central left breast. The distance between these groups was 4.5 cm. There were no other findings of concern.  On 09/03/2014 [name] underwent biopsy of the 2 left breast areas in question. The larger area at 12:30 o'clock showed atypical ductal hyperplasia as did the separate more central left breast lesion (SAA [id number]).  The patient was then referred to Dr. [name] and after appropriate discussion he proceeded to needle localization excisional biopsies 2. The result of this "double lumpectomy" on 09/03/2014 showed (Drew [phone]) at the larger lesion, at 12:30 o'clock, marked with an X shaped clip, a benign complex sclerosing lesion/radial scar associated with lobular carcinoma in situ. In the more central, smaller lesion, marked with a cylinder shaped clip, there was again atypical lobular hyperplasia and lobular carcinoma in situ..  Because of these concerns, on 10/18/2014 the patient underwent bilateral breast MRIs. The right breast showed scattered benign appearing enhancing foci. In the left breast there  was a seroma involving both the upper outer and lower outer quadrant measuring up to 5.4 cm. There were no other areas of concern and no abnormal adenopathy or other ancillary findings. Because of both the right breast and left breast findings a repeat MRI of the breast in 6 months was recommended.  The patient's subsequent history is as detailed below  INTERVAL HISTORY: Rosabel returns today for follow-up of her history  LCIS accompanied by her husband [name]. She continues on tamoxifen. She does have hot flashes at night but is on gabapentin for this. The gabapentin works well except some days it makes a little sleepy when she wakes up area she is not having vaginal wetness from the tamoxifen. She obtains both drugs at a very good price  REVIEW OF SYSTEMS: She has arthritis pains particularly involving her back which are not more persistent or intense than before. Her psoriasis is well-controlled. The biggest problem she is having is third shift work, which greatly alters her sleep pattern. This is going to be permanent she says. She is also having some cramps, for which she was started on potassium. Aside from these issues a detailed review of systems today was benign   Past Medical History:  Diagnosis Date  . Allergic rhinitis   . Breast cancer ([place])   . Chickenpox   . GERD (gastroesophageal reflux disease)   . Headache   . Hemorrhoids   . History of blood transfusion   . Hypercholesteremia     PAST SURGICAL HISTORY: Past Surgical History:  Procedure Laterality Date  . ABDOMINAL SURGERY    . APPENDECTOMY    . BREAST BIOPSY Left 10/02/2014   Procedure: LEFT BREAST BIOPSY AFTER NEEDLE LOCALIZATION X TWO;  Surgeon:  [name], MD;  Location: [facility];  Service: General;  Laterality: Left;  . BREAST LUMPECTOMY  2016  . COLONOSCOPY N/A 10/23/2014   Procedure: COLONOSCOPY;  Surgeon: Rogene Houston, MD;  Location: [facility];  Service: Endoscopy;  Laterality: N/A;  730  . LAPAROSCOPIC  APPENDECTOMY  03/02/2012   Procedure: APPENDECTOMY LAPAROSCOPIC;  Surgeon: [name], MD;  Location: [facility];  Service: General;  Laterality: N/A;  . OVARIAN CYST REMOVAL Left   . TUBAL LIGATION    . WISDOM TOOTH EXTRACTION Bilateral     FAMILY HISTORY Family History  Problem Relation Age of Onset  . Heart attack Father   . Hypertension    . Diabetes    the patient's father died from a heart attack at age 21. The patient's mother is living, at age 69. The patient had 1 full brother and one full sister, then 2 half sisters. The full sister was diagnosed with either cancer of the uterus or perhaps more likely cancer of the cervix in her 52s (the patient tells me that her sister had 2 babies after being diagnosed, so uterine cancer appears unlikely). There is no other history of breast or ovarian cancer in the family and no history of colon cancer in the family  GYNECOLOGIC HISTORY:  No LMP recorded. Patient is not currently having periods (Reason: Other). Menarche age 40, first live birth age 50. She is GX P2. She is still having regular periods, most recently late August. ("This one is a little late". 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 Currently she is doing third shift 8 hours 5 days a week. She also sits sometimes for private clients. Her husband [name] worked for the Technical sales engineer chiefly on [place]. Son [name] in [name] is a Metallurgist, doing body work, and son [name] in [place] is a Health and safety inspector. The patient has no grandchildren. She attends a local [employer] [place]: not in place; the patient is aware that her husband would be her healthcare power of attorney by default in casesomething happened to her and she is comfortable with that   HEALTH MAINTENANCE: Social History  Substance  Use Topics  . Smoking status: Never Smoker  . Smokeless tobacco: Not on file  . Alcohol use No     Colonoscopy: 10/23/2014; unremarkable  PAP:06/21/2014  Bone density:never  Lipid panel:  No Known Allergies  Current Outpatient Prescriptions  Medication Sig Dispense Refill  . potassium chloride (K-DUR) 10 MEQ tablet Take 10 mEq by mouth daily.    Marland Kitchen aspirin-acetaminophen-caffeine (EXCEDRIN MIGRAINE) 250-250-65 MG tablet Take 1 tablet by mouth every 6 (six) hours as needed for headache. 30 tablet 0  . gabapentin (NEURONTIN) 100 MG capsule Take 1-2 capsules (100-200 mg total) by mouth at bedtime. 120 capsule 6  . ibuprofen (ADVIL) 200 MG tablet Take 1 tablet (200 mg total) by mouth every 6 (six) hours as needed. 30 tablet 0  . levothyroxine (SYNTHROID, LEVOTHROID) 50 MCG tablet Take 1 tablet (50 mcg total) by mouth daily. 90 tablet 3  . tamoxifen (NOLVADEX) 20 MG tablet Take 1 tablet (20 mg total) by mouth daily. 90 tablet 4   No current facility-administered medications for this visit.     OBJECTIVE: middle-aged white woman Who appears stated age 78:   11/19/15 0836  BP: (!) 128/59  Pulse: 66  Resp: 18  Temp: 98 F (36.7 C)     Body mass index is 24.39 kg/m.    ECOG FS:1 - Symptomatic but completely ambulatory  Sclerae unicteric, EOMs intact Oropharynx clear and moist No cervical or supraclavicular adenopathy Lungs no rales or rhonchi Heart regular rate and rhythm Abd soft, nontender, positive bowel sounds MSK no focal spinal tenderness, no upper extremity lymphedema Neuro: nonfocal, well oriented, appropriate affect Breasts: The right breast shows no suspicious findings. The left breast is status post prior lumpectomies. There is no evidence of local recurrence. Left axilla is benign.:    LAB RESULTS:  CMP     Component Value Date/Time   NA 140 11/25/2014 1539   K 3.9 11/25/2014 1539   CL 105 09/27/2014 0950   CO2 24 11/25/2014 1539   GLUCOSE 93 11/25/2014  1539   BUN 12.5 11/25/2014 1539   CREATININE 0.8 11/25/2014 1539   CALCIUM 9.0 11/25/2014 1539   PROT 6.9 11/25/2014 1539   ALBUMIN 3.8 11/25/2014 1539   AST 15 11/25/2014 1539   ALT 12 11/25/2014 1539   ALKPHOS 71 11/25/2014 1539   BILITOT 0.34 11/25/2014 1539   GFRNONAA >60 09/27/2014 0950   GFRAA >60 09/27/2014 0950    INo results found for: SPEP, UPEP  Lab Results  Component Value Date   WBC 5.3 11/19/2015   NEUTROABS 2.7 11/19/2015   HGB 13.9 11/19/2015   HCT 41.1 11/19/2015   MCV 92.8 11/19/2015   PLT 235 11/19/2015      Chemistry      Component Value Date/Time   NA 140 11/25/2014 1539   K 3.9 11/25/2014 1539   CL 105 09/27/2014 0950   CO2 24 11/25/2014 1539   BUN 12.5 11/25/2014 1539   CREATININE 0.8 11/25/2014 1539      Component Value Date/Time   CALCIUM 9.0 11/25/2014 1539   ALKPHOS 71 11/25/2014 1539   AST 15 11/25/2014 1539   ALT 12 11/25/2014 1539   BILITOT 0.34 11/25/2014 1539       No results found for: LABCA2  No components found for: LABCA125  No results for input(s): INR in the last 168 hours.  Urinalysis    Component Value Date/Time   COLORURINE YELLOW 03/02/2012 1740   APPEARANCEUR CLEAR 03/02/2012 1740   LABSPEC <1.005 (L) 03/02/2012 1740   PHURINE 6.5 03/02/2012 1740   GLUCOSEU NEGATIVE 03/02/2012 1740   HGBUR MODERATE (A) 03/02/2012 1740   BILIRUBINUR NEGATIVE 03/02/2012 1740   KETONESUR NEGATIVE 03/02/2012 1740   PROTEINUR NEGATIVE 03/02/2012 1740   UROBILINOGEN 1.0 03/02/2012 1740   NITRITE NEGATIVE 03/02/2012 1740   LEUKOCYTESUR NEGATIVE 03/02/2012 1740    STUDIES: No results found.   ASSESSMENT: 50 y.o. [place], [place] woman status post core needle biopsy 2 on 09/03/2014, both areas showing atypical ductal hyperplasia  (1) s/p Left breast excisional biopsy x2  10/02/2014 for a complex sclerosing lesion/radial scar associated with atypical lobular hyperplasia and lobular carcinoma is situ, and a separate area showing  also lobular carcinoma in situ  (2) on prophylactic tamoxifen as of March 2017   PLAN:  Saavi is tolerating the tamoxifen well and the plan will be to continue this for a total of 5 years, which we'll cut her risk of breast cancer in half.  The one issue is nighttime hot flashes. These are well controlled on the gabapentin, but the drug is making her a little sleepy. I think she would do better on 100 or perhaps 200 mg  instead of 300 mg at bedtime. She is going to try and I wrote the prescription for her.  She will have her next mammogram March. This should be with "3-D" (tomography). She will see her primary care physician shortly after for routine follow-up. Accordingly she will see me again in one year.   She knows to call for any problems that may develop before then. Chauncey Cruel, MD   11/19/2015 9:05 AM Medical Oncology and Hematology [facility] [address] [place], [place] [address] Tel. [phone]    Fax. [phone]

## 2016-01-28 ENCOUNTER — Encounter: Payer: Self-pay | Admitting: Family Medicine

## 2016-01-28 ENCOUNTER — Ambulatory Visit (INDEPENDENT_AMBULATORY_CARE_PROVIDER_SITE_OTHER): Payer: Commercial Managed Care - PPO | Admitting: Family Medicine

## 2016-01-28 DIAGNOSIS — J0110 Acute frontal sinusitis, unspecified: Secondary | ICD-10-CM | POA: Diagnosis not present

## 2016-01-28 MED ORDER — HYDROCODONE-HOMATROPINE 5-1.5 MG/5ML PO SYRP: 5.0000 mL | ORAL_SOLUTION | Freq: Three times a day (TID) | ORAL | 0 refills | Status: DC | PRN

## 2016-01-28 MED ORDER — AMOXICILLIN-POT CLAVULANATE 875-125 MG PO TABS: 1.0000 | ORAL_TABLET | Freq: Two times a day (BID) | ORAL | 0 refills | Status: DC

## 2016-01-28 NOTE — Assessment & Plan Note (Addendum)
Patient's symptoms most consistent with sinusitis. Given duration of symptoms likely bacterial in nature. We will treat with Augmentin. Hycodan for cough. Warned of potential for drowsiness with Hycodan. Can use Claritin as well. Given return precautions.

## 2016-01-28 NOTE — Progress Notes (Signed)
Pre visit review using our clinic review tool, if applicable. No additional management support is needed unless otherwise documented below in the visit note. 

## 2016-01-28 NOTE — Patient Instructions (Signed)
Nice to see you. You have a sinus infection. We will treat with Augmentin. You can take Hycodan for cough. You can also try Claritin to see if that is beneficial. You should take a probiotic with the antibiotic or eat yogurt to help limit the risk of diarrhea. If you develop cough productive of blood, fevers, or shortness of breath please seek medical attention immediately.

## 2016-01-28 NOTE — Progress Notes (Addendum)
  Tommi Rumps, MD Phone: [phone]  Erica Woods is a 50 y.o. female who presents today for same-day visit.  Patient notes 3 weeks of cough, congestion, sore throat, postnasal drip, and blowing green mucus out of her nose. Notes she is coughing up green phlegm. No fevers. No shortness of breath. No wheezing. She initially improved though then worsened again. Has taken Mucinex and Delsym with little benefit.  PMH: Nonsmoker  ROS see history of present illness  Objective  Physical Exam Vitals:   01/28/16 1022  BP: 126/80  Pulse: 81  Temp: 97.9 F (36.6 C)    BP Readings from Last 3 Encounters:  01/28/16 126/80  11/19/15 (!) 128/59  05/19/15 (!) 126/50   Wt Readings from Last 3 Encounters:  01/28/16 148 lb 3.2 oz (67.2 kg)  11/19/15 142 lb 1.6 oz (64.5 kg)  05/19/15 145 lb 11.2 oz (66.1 kg)    Physical Exam  Constitutional: No distress.  HENT:  Head: Normocephalic and atraumatic.  Mouth/Throat: Oropharynx is clear and moist. No oropharyngeal exudate.  Normal TMs bilaterally, frontal sinus tenderness to percussion  Eyes: Conjunctivae are normal. Pupils are equal, round, and reactive to light.  Neck: Neck supple.  Cardiovascular: Normal rate, regular rhythm and normal heart sounds.   Pulmonary/Chest: Effort normal and breath sounds normal.  Lymphadenopathy:    She has no cervical adenopathy.  Neurological: She is alert. Gait normal.  Skin: Skin is warm and dry. She is not diaphoretic.     Assessment/Plan: Please see individual problem list.  Sinusitis, acute frontal Patient's symptoms most consistent with sinusitis. Given duration of symptoms likely bacterial in nature. We will treat with Augmentin. Hycodan for cough. Warned of potential for drowsiness with Hycodan. Can use Claritin as well. Given return precautions.   No orders of the defined types were placed in this encounter.   Meds ordered this encounter  Medications  . amoxicillin-clavulanate  (AUGMENTIN) 875-125 MG tablet    Sig: Take 1 tablet by mouth 2 (two) times daily.    Dispense:  14 tablet    Refill:  0  . HYDROcodone-homatropine (HYCODAN) 5-1.5 MG/5ML syrup    Sig: Take 5 mLs by mouth every 8 (eight) hours as needed for cough.    Dispense:  120 mL    Refill:  0    Tommi Rumps, MD [place]

## 2016-02-17 ENCOUNTER — Telehealth: Payer: Self-pay | Admitting: *Deleted

## 2016-02-17 NOTE — Telephone Encounter (Signed)
Is it ok to change?  

## 2016-02-17 NOTE — Telephone Encounter (Signed)
[facility] with have a different carrier of manufacture for the medication levothyroxine. Pharmacy will need verbal consent to change this medication to a different generic. Contact [phone]

## 2016-02-18 NOTE — Telephone Encounter (Signed)
It is okay to change manufacturers. Thanks.

## 2016-02-18 NOTE — Telephone Encounter (Signed)
Pharmacy notified.

## 2016-04-15 ENCOUNTER — Other Ambulatory Visit: Payer: Self-pay | Admitting: Oncology

## 2016-04-15 DIAGNOSIS — Z853 Personal history of malignant neoplasm of breast: Secondary | ICD-10-CM

## 2016-04-26 ENCOUNTER — Ambulatory Visit
Admission: RE | Admit: 2016-04-26 | Discharge: 2016-04-26 | Disposition: A | Discharge status: Home / Self Care | Payer: Commercial Managed Care - PPO | Source: Ambulatory Visit | Attending: Oncology | Admitting: Oncology

## 2016-04-26 ENCOUNTER — Other Ambulatory Visit: Payer: Self-pay | Admitting: Oncology

## 2016-04-26 DIAGNOSIS — Z1239 Encounter for other screening for malignant neoplasm of breast: Secondary | ICD-10-CM

## 2016-04-26 DIAGNOSIS — Z853 Personal history of malignant neoplasm of breast: Secondary | ICD-10-CM

## 2016-05-04 ENCOUNTER — Other Ambulatory Visit (HOSPITAL_COMMUNITY)
Admission: RE | Admit: 2016-05-04 | Discharge: 2016-05-04 | Disposition: A | Discharge status: Home / Self Care | Payer: Commercial Managed Care - PPO | Source: Ambulatory Visit | Attending: Family Medicine | Admitting: Family Medicine

## 2016-05-04 ENCOUNTER — Ambulatory Visit (INDEPENDENT_AMBULATORY_CARE_PROVIDER_SITE_OTHER): Payer: Commercial Managed Care - PPO | Admitting: Family Medicine

## 2016-05-04 ENCOUNTER — Encounter: Payer: Self-pay | Admitting: Family Medicine

## 2016-05-04 VITALS — BP 120/70 | HR 72 | Temp 98.0°F | Wt 157.4 lb

## 2016-05-04 DIAGNOSIS — Z1151 Encounter for screening for human papillomavirus (HPV): Secondary | ICD-10-CM | POA: Insufficient documentation

## 2016-05-04 DIAGNOSIS — E039 Hypothyroidism, unspecified: Secondary | ICD-10-CM | POA: Diagnosis not present

## 2016-05-04 DIAGNOSIS — Z13220 Encounter for screening for lipoid disorders: Secondary | ICD-10-CM | POA: Diagnosis not present

## 2016-05-04 DIAGNOSIS — Z124 Encounter for screening for malignant neoplasm of cervix: Secondary | ICD-10-CM | POA: Diagnosis not present

## 2016-05-04 DIAGNOSIS — Z130 Encounter for screening for diseases of the blood and blood-forming organs and certain disorders involving the immune mechanism: Secondary | ICD-10-CM

## 2016-05-04 DIAGNOSIS — M25541 Pain in joints of right hand: Secondary | ICD-10-CM

## 2016-05-04 DIAGNOSIS — Z01419 Encounter for gynecological examination (general) (routine) without abnormal findings: Secondary | ICD-10-CM | POA: Insufficient documentation

## 2016-05-04 DIAGNOSIS — M25542 Pain in joints of left hand: Secondary | ICD-10-CM

## 2016-05-04 DIAGNOSIS — D0502 Lobular carcinoma in situ of left breast: Secondary | ICD-10-CM

## 2016-05-04 DIAGNOSIS — Z0001 Encounter for general adult medical examination with abnormal findings: Secondary | ICD-10-CM

## 2016-05-04 DIAGNOSIS — E663 Overweight: Secondary | ICD-10-CM

## 2016-05-04 LAB — COMPREHENSIVE METABOLIC PANEL
ALK PHOS: 57 U/L (ref 39–117)
ALT: 14 U/L (ref 0–35)
AST: 12 U/L (ref 0–37)
Albumin: 3.8 g/dL (ref 3.5–5.2)
BILIRUBIN TOTAL: 0.3 mg/dL (ref 0.2–1.2)
BUN: 12 mg/dL (ref 6–23)
CO2: 31 mEq/L (ref 19–32)
Calcium: 9.5 mg/dL (ref 8.4–10.5)
Chloride: 104 mEq/L (ref 96–112)
Creatinine, Ser: 0.74 mg/dL (ref 0.40–1.20)
GFR: 87.57 mL/min (ref >60.00)
GLUCOSE: 112 mg/dL — AB (ref 70–99)
Potassium: 4.7 mEq/L (ref 3.5–5.1)
SODIUM: 141 meq/L (ref 135–145)
Total Protein: 6.4 g/dL (ref 6.0–8.3)

## 2016-05-04 LAB — LIPID PANEL
Cholesterol: 230 mg/dL — ABNORMAL HIGH (ref 0–200)
HDL: 60.3 mg/dL (ref >39.00)
LDL Cholesterol: 134 mg/dL — ABNORMAL HIGH (ref 0–99)
NONHDL: 170.18
Total CHOL/HDL Ratio: 4
Triglycerides: 183 mg/dL — ABNORMAL HIGH (ref 0.0–149.0)
VLDL: 36.6 mg/dL (ref 0.0–40.0)

## 2016-05-04 LAB — TSH: TSH: 2.79 u[IU]/mL (ref 0.35–4.50)

## 2016-05-04 LAB — CBC
HCT: 39.2 % (ref 36.0–46.0)
Hemoglobin: 13 g/dL (ref 12.0–15.0)
MCHC: 33.2 g/dL (ref 30.0–36.0)
MCV: 94.2 fl (ref 78.0–100.0)
PLATELETS: 236 10*3/uL (ref 150.0–400.0)
RBC: 4.16 Mil/uL (ref 3.87–5.11)
RDW: 13.1 % (ref 11.5–15.5)
WBC: 7.6 10*3/uL (ref 4.0–10.5)

## 2016-05-04 LAB — HEMOGLOBIN A1C: HEMOGLOBIN A1C: 6.3 % (ref 4.6–6.5)

## 2016-05-04 NOTE — Patient Instructions (Signed)
Nice to see you. Please continue to work on diet and exercise. We are going to check some lab work and contact you with the results. We will check your Pap smear as well. We will send a message to your oncologist to ensure that you do not need any follow-up imaging given the tenderness that has been chronic.

## 2016-05-04 NOTE — Assessment & Plan Note (Signed)
Physical exam completed. Pap smear completed at patient's request. Breast exam completed with mild tenderness. Patient reports this is chronic and we will send a note to her oncologist to determine if any further evaluation needs to be done. She's currently on tamoxifen. She is up-to-date on colonoscopy. Flu shot and tetanus vaccination up-to-date as well. We will check lab work as outlined below.

## 2016-05-04 NOTE — Progress Notes (Signed)
Tommi Rumps, MD Phone: [phone]  Erica Woods is a 51 y.o. female who presents today for physical exam.  Walks daily for 45 minutes weather permitting. Diet is mostly vegetables and meats. She's cut down significantly on sweet tea and soda. Tetanus vaccination up-to-date. Mammography up-to-date. She has a history of breast cancer and is followed by oncology for this. She reports chronic residual tenderness in the left breast that is unchanged. Colonoscopy up-to-date. HIV testing and flu vaccinations up-to-date. She is requesting a Pap smear this year. She's not had a menstrual cycle in greater than one year. No tobacco use or alcohol use or illicit drug use. She has noted some swelling in her bilateral hands with some discomfort in the joints. She does have psoriasis. No swelling today. She notes chronic tinnitus in her ears that is intermittent and unchanged.  Active Ambulatory Problems    Diagnosis Date Noted  . 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 Migraines 11/25/2014  . Reflux esophagitis 11/25/2014  . Encounter for general adult medical examination with abnormal findings 05/02/2015  . 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 Sinusitis, acute frontal 01/28/2016  . Arthralgia of both hands 05/04/2016   Resolved Ambulatory Problems    Diagnosis Date Noted  . Neoplasm of breast, 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/2014   Past Medical History:  Diagnosis Date  . Allergic rhinitis   . Breast cancer ([place])   . Chickenpox   . GERD (gastroesophageal reflux disease)   . Headache   . Hemorrhoids   . History of blood transfusion   . Hypercholesteremia     Family History  Problem Relation Age of Onset  . Heart attack Father   . Hypertension    . Diabetes      Social History   Social History  . Marital status: Married    Spouse name: N/A  . Number of children: N/A  . Years of  education: N/A   Occupational History  . Not on file.   Social History Main Topics  . Smoking status: Never Smoker  . Smokeless tobacco: Not on file  . Alcohol use No  . Drug use: No  . Sexual activity: Not on file   Other Topics Concern  . Not on file   Social History Narrative  . No narrative on file    ROS  General:  Negative for nexplained weight loss, fever Skin: Negative for new or changing mole, sore that won't heal HEENT: Negative for trouble hearing, trouble seeing, ringing in ears, mouth sores, hoarseness, change in voice, dysphagia. CV:  Negative for chest pain, dyspnea, edema, palpitations Resp: Negative for cough, dyspnea, hemoptysis GI: Negative for nausea, vomiting, diarrhea, constipation, abdominal pain, melena, hematochezia. GU: Negative for dysuria, incontinence, urinary hesitance, hematuria, vaginal or penile discharge, polyuria, sexual difficulty, lumps in testicle or breasts MSK: Negative for muscle cramps or aches, joint pain or swelling Neuro: Negative for headaches, weakness, numbness, dizziness, passing out/fainting Psych: Negative for depression, anxiety, memory problems  Objective  Physical Exam Vitals:   05/04/16 0838  BP: 120/70  Pulse: 72  Temp: 98 F (36.7 C)    BP Readings from Last 3 Encounters:  05/04/16 120/70  01/28/16 126/80  11/19/15 (!) 128/59   Wt Readings from Last 3 Encounters:  05/04/16 157 lb 6.4 oz (71.4 kg)  01/28/16 148 lb 3.2 oz (67.2 kg)  11/19/15 142 lb 1.6 oz (64.5 kg)  Physical Exam  Constitutional: She is well-developed, well-nourished, and in no distress.  HENT:  Head: Normocephalic and atraumatic.  Mouth/Throat: Oropharynx is clear and moist. No oropharyngeal exudate.  Eyes: Conjunctivae are normal. Pupils are equal, round, and reactive to light.  Neck: Neck supple.  Cardiovascular: Normal rate, regular rhythm and normal heart sounds.   Pulmonary/Chest: Effort normal and breath sounds normal.    Abdominal: Soft. Bowel sounds are normal.  Genitourinary:  Genitourinary Comments: Normal labia, normal vaginal mucosa, normal cervix, normal bimanual exam, left breast with 2 lateral scars that are well-healed, mild tenderness in the medial mid aspect of the left breast that patient reports is chronic and unchanged  Musculoskeletal: She exhibits no edema.  Lymphadenopathy:    She has no cervical adenopathy.  Neurological: She is alert. Gait normal.  Skin: Skin is warm and dry. She is not diaphoretic.  Psychiatric: Mood and affect normal.  Scattered patches of psoriasis noted on bilateral hands, no swelling bilateral hands   Assessment/Plan:   Lobular carcinoma in situ (LCIS) of left breast With history of breast cancer. Recent mammogram negative. Has mild tenderness that she reports is chronic in the left medial mid breast. There is no palpable abnormality in this area. Tenderness noted on oncologists most recent exam in October as well. We'll send a message to the oncologist to determine if any further evaluation needs to be done. I advised the patient that if she does not hear back from [place] regarding this in the next week she needs to contact us.  Encounter for general adult medical examination with abnormal findings Physical exam completed. Pap smear completed at patient's request. Breast exam completed with mild tenderness. Patient reports this is chronic and we will send a note to her oncologist to determine if any further evaluation needs to be done. She's currently on tamoxifen. She is up-to-date on colonoscopy. Flu shot and tetanus vaccination up-to-date as well. We will check lab work as outlined below.  Arthralgia of both hands Refer to rheumatology for evaluation given history of psoriasis.   Orders Placed This Encounter  Procedures  . Lipid Profile  . Comp Met (CMET)  . CBC  . TSH  . HgB A1c  . Ambulatory referral to Rheumatology    Referral Priority:   Routine     Referral Type:   Consultation    Referral Reason:   Specialty Services Required    Requested Specialty:   Rheumatology    Number of Visits Requested:   1    No orders of the defined types were placed in this encounter.    Tommi Rumps, MD [place]

## 2016-05-04 NOTE — Assessment & Plan Note (Signed)
Refer to rheumatology for evaluation given history of psoriasis.

## 2016-05-04 NOTE — Assessment & Plan Note (Addendum)
With history of breast cancer. Recent mammogram negative. Has mild tenderness that she reports is chronic in the left medial mid breast. There is no palpable abnormality in this area. Tenderness noted on oncologists most recent exam in October as well. We'll send a message to the oncologist to determine if any further evaluation needs to be done. I advised the patient that if she does not hear back from [place] regarding this in the next week she needs to contact us.

## 2016-05-05 LAB — CYTOLOGY - PAP
Diagnosis: NEGATIVE
HPV: NOT DETECTED

## 2016-05-06 ENCOUNTER — Other Ambulatory Visit: Payer: Self-pay | Admitting: Oncology

## 2016-05-24 ENCOUNTER — Other Ambulatory Visit: Payer: Self-pay | Admitting: Family Medicine

## 2016-06-01 ENCOUNTER — Ambulatory Visit (INDEPENDENT_AMBULATORY_CARE_PROVIDER_SITE_OTHER): Payer: Commercial Managed Care - PPO | Admitting: Podiatry

## 2016-06-01 DIAGNOSIS — M79672 Pain in left foot: Secondary | ICD-10-CM | POA: Diagnosis not present

## 2016-06-01 DIAGNOSIS — M722 Plantar fascial fibromatosis: Secondary | ICD-10-CM

## 2016-06-01 MED ORDER — METHYLPREDNISOLONE 4 MG PO TBPK: ORAL_TABLET | ORAL | 0 refills | Status: DC

## 2016-06-01 MED ORDER — MELOXICAM 15 MG PO TABS: 15.0000 mg | ORAL_TABLET | Freq: Every day | ORAL | 1 refills | Status: AC

## 2016-06-02 NOTE — Progress Notes (Signed)
   Subjective: Patient presents today for stabbing, dull, aching pain and tenderness in the left foot. Patient states the foot pain has been hurting for about 3 weeks now. She states it feels as if she is walking on a nail. Patient states that it hurts in the mornings with the first steps out of bed. She has been applying heat therapy and doing Epsom salt soaks with no significant relief. Patient presents today for further treatment and evaluation  Objective: Physical Exam General: The patient is alert and oriented x3 in no acute distress.  Dermatology: Skin is warm, dry and supple bilateral lower extremities. Negative for open lesions or macerations bilateral.   Vascular: Dorsalis Pedis and Posterior Tibial pulses palpable bilateral.  Capillary fill time is immediate to all digits.  Neurological: Epicritic and protective threshold intact bilateral.   Musculoskeletal: Tenderness to palpation at the medial calcaneal tubercale and through the insertion of the plantar fascia of the left foot. All other joints range of motion within normal limits bilateral. Strength 5/5 in all groups bilateral.    Assessment: 1. Plantar fasciitis left foot 2. Pain in left foot  Plan of Care:   1. Patient evaluated. Xrays reviewed.   2. Injection of 0.5cc Celestone soluspan injected into the left plantar fascia.  3. Instructed patient regarding therapies and modalities at home to alleviate symptoms.  4. Rx for meloxicam 15mg  PO given to patient.  5. Rx for Medrol Dosepak given.  6. Plantar fascial band(s) dispensed. 7. Return to clinic in 4 weeks.     Edrick Kins, DPM [facility]  Dr. Edrick Kins, [place]                                        [place], [place] [address]                Office [phone]  Fax [phone]

## 2016-06-13 MED ORDER — BETAMETHASONE SOD PHOS & ACET 6 (3-3) MG/ML IJ SUSP: 3.0000 mg | Freq: Once | INTRAMUSCULAR | Status: DC

## 2016-06-29 ENCOUNTER — Ambulatory Visit (INDEPENDENT_AMBULATORY_CARE_PROVIDER_SITE_OTHER): Payer: Commercial Managed Care - PPO | Admitting: Podiatry

## 2016-06-29 DIAGNOSIS — M722 Plantar fascial fibromatosis: Secondary | ICD-10-CM | POA: Diagnosis not present

## 2016-06-30 NOTE — Progress Notes (Signed)
   Subjective: Patient presents today for follow-up evaluation of plantar fasciitis of the left foot. He states the heel pain is better but now he is experiencing constant pain in the posterior heel. Touching the area increases the pain. He states the injections helped to alleviate the pain temporarily. He has been taking meloxicam and the Medrol Dosepak with no significant relief.  Objective: Physical Exam General: The patient is alert and oriented x3 in no acute distress.  Dermatology: Skin is warm, dry and supple bilateral lower extremities. Negative for open lesions or macerations bilateral.   Vascular: Dorsalis Pedis and Posterior Tibial pulses palpable bilateral.  Capillary fill time is immediate to all digits.  Neurological: Epicritic and protective threshold intact bilateral.   Musculoskeletal: Tenderness to palpation at the medial calcaneal tubercale and through the insertion of the plantar fascia of the left foot. All other joints range of motion within normal limits bilateral. Strength 5/5 in all groups bilateral.    Assessment: 1. Plantar fasciitis left foot 2. Pain in left foot  Plan of Care:  1. Patient evaluated. 2. Injection of 0.5cc Celestone soluspan injected into the left plantar fascia.  3. Instructed patient regarding therapies and modalities at home to alleviate symptoms.  4. Continue taking meloxicam. 5. Continue wearing plantar fasciitis brace. 6. Continue wearing new balance sneakers. 7. Return to clinic in 4 weeks.     Edrick Kins, DPM [facility]  Dr. Edrick Kins, [address]                                        [place], [place] [address]                Office [phone]  Fax [phone]

## 2016-07-03 MED ORDER — BETAMETHASONE SOD PHOS & ACET 6 (3-3) MG/ML IJ SUSP: 3.0000 mg | Freq: Once | INTRAMUSCULAR | Status: DC

## 2016-07-05 DIAGNOSIS — M722 Plantar fascial fibromatosis: Secondary | ICD-10-CM | POA: Insufficient documentation

## 2016-07-05 DIAGNOSIS — L409 Psoriasis, unspecified: Secondary | ICD-10-CM | POA: Insufficient documentation

## 2016-07-27 ENCOUNTER — Ambulatory Visit (INDEPENDENT_AMBULATORY_CARE_PROVIDER_SITE_OTHER): Payer: Commercial Managed Care - PPO | Admitting: Podiatry

## 2016-07-27 ENCOUNTER — Encounter: Payer: Self-pay | Admitting: Podiatry

## 2016-07-27 DIAGNOSIS — M722 Plantar fascial fibromatosis: Secondary | ICD-10-CM

## 2016-08-05 NOTE — Progress Notes (Signed)
Plantar fasciitis left   Subjective: Patient presents today for follow-up evaluation of plantar fasciitis of the left foot. He reports his pain has improved. He states the injection he received helped alleviate the pain. He denies any new complaints at this time.  Objective: Physical Exam General: The patient is alert and oriented x3 in no acute distress.  Dermatology: Skin is warm, dry and supple bilateral lower extremities. Negative for open lesions or macerations bilateral.   Vascular: Dorsalis Pedis and Posterior Tibial pulses palpable bilateral.  Capillary fill time is immediate to all digits.  Neurological: Epicritic and protective threshold intact bilateral.   Musculoskeletal: Tenderness to palpation at the medial calcaneal tubercale and through the insertion of the plantar fascia of the left foot. All other joints range of motion within normal limits bilateral. Strength 5/5 in all groups bilateral.    Assessment: 1. Plantar fasciitis left foot-improved 2. Pain in left foot  Plan of Care:  1. Patient evaluated. 2. Continuing plantar fascial brace and taking meloxicam 15 mg when necessary. 3. Patient does not want another injection. 4. Return to clinic when necessary.   Edrick Kins, DPM [facility]  Dr. Edrick Kins, [address]                                        [place], [place] [address]                Office [phone]  Fax [phone]

## 2016-10-08 ENCOUNTER — Encounter: Payer: Self-pay | Admitting: Family Medicine

## 2016-10-08 ENCOUNTER — Ambulatory Visit (INDEPENDENT_AMBULATORY_CARE_PROVIDER_SITE_OTHER): Payer: Commercial Managed Care - PPO | Admitting: Family Medicine

## 2016-10-08 DIAGNOSIS — R300 Dysuria: Secondary | ICD-10-CM | POA: Insufficient documentation

## 2016-10-08 LAB — POC URINALSYSI DIPSTICK (AUTOMATED)
Bilirubin, UA: NEGATIVE
Blood, UA: NEGATIVE
GLUCOSE UA: NEGATIVE
Leukocytes, UA: NEGATIVE
NITRITE UA: NEGATIVE
PROTEIN UA: NEGATIVE
Spec Grav, UA: 1.025 (ref 1.010–1.025)
UROBILINOGEN UA: 0.2 U/dL
pH, UA: 5.5 (ref 5.0–8.0)

## 2016-10-08 MED ORDER — CEPHALEXIN 500 MG PO CAPS: 500.0000 mg | ORAL_CAPSULE | Freq: Two times a day (BID) | ORAL | 0 refills | Status: DC

## 2016-10-08 NOTE — Progress Notes (Signed)
Subjective:  Patient ID: Erica Woods, female    DOB: [birth year]  Age: 51 y.o. MRN: [id number]  CC: Concern for UTI  HPI:  51 year old female presents with concerns for UTI.  Patient reports a 2 day history of dysuria. Lower abdominal pain. She reports that she developed back pain this morning. No associated fever. She's been taking ibuprofen with little improvement. No flank pain. No known exacerbating factors. No other associated symptoms. No other complaints or concerns at this time.  Social Hx   Social History   Social History  . Marital status: Married    Spouse name: N/A  . Number of children: N/A  . Years of education: N/A   Social History Main Topics  . Smoking status: Never Smoker  . Smokeless tobacco: Never Used  . Alcohol use No  . Drug use: No  . Sexual activity: Not Asked   Other Topics Concern  . None   Social History Narrative  . None    Review of Systems  Constitutional: Negative for fever.  Gastrointestinal: Positive for abdominal pain.  Genitourinary: Positive for dysuria.  Musculoskeletal: Positive for back pain.   Objective:  BP 110/70 (BP Location: Left Arm, Patient Position: Sitting, Cuff Size: Normal)   Pulse 66   Temp 99.4 F (37.4 C) (Oral)   Resp 16   Wt 150 lb 6 oz (68.2 kg)   LMP 10/09/2014   SpO2 99%   BMI 25.81 kg/m   BP/Weight 10/08/2016 05/04/2016 97/35/3299  Systolic BP 242 683 419  Diastolic BP 70 70 80  Wt. (Lbs) 150.38 157.4 148.2  BMI 25.81 27.02 25.44    Physical Exam  Constitutional: She is oriented to person, place, and time. She appears well-developed. No distress.  Cardiovascular: Normal rate and regular rhythm.   No murmur heard. Pulmonary/Chest: Effort normal. She has no wheezes. She has no rales.  Abdominal: Soft.  Suprapubic tenderness to palpation.  Neurological: She is alert and oriented to person, place, and time.  Psychiatric: She has a normal mood and affect.  Vitals reviewed.  Results for orders  placed or performed in visit on 10/08/16 (from the past 24 hour(s))  POCT Urinalysis Dipstick (Automated)     Status: Abnormal   Collection Time: 10/08/16 10:09 AM  Result Value Ref Range   Color, UA yellow    Clarity, UA clear    Glucose, UA neg    Bilirubin, UA neg    Ketones, UA trace    Spec Grav, UA 1.025 1.010 - 1.025   Blood, UA neg    pH, UA 5.5 5.0 - 8.0   Protein, UA neg    Urobilinogen, UA 0.2 0.2 or 1.0 E.U./dL   Nitrite, UA neg    Leukocytes, UA Negative Negative   Assessment & Plan:   Problem List Items Addressed This Visit    Dysuria    New acute problem. Associated suprapubic pain. Urinalysis with trace ketones. Otherwise unremarkable. Given her presentation, I am treating her empirically for UTI with keflex while we wait for culture.      Relevant Medications   cephALEXin (KEFLEX) 500 MG capsule   Other Relevant Orders   POCT Urinalysis Dipstick (Automated) (Completed)   Urine Culture      Meds ordered this encounter  Medications  . meloxicam (MOBIC) 15 MG tablet    Sig: Take 15 mg by mouth.  . cephALEXin (KEFLEX) 500 MG capsule    Sig: Take 1 capsule (500 mg total) by mouth  2 (two) times daily.    Dispense:  10 capsule    Refill:  0   Follow-up: PRN  [place]

## 2016-10-08 NOTE — Assessment & Plan Note (Addendum)
New acute problem. Associated suprapubic pain. Urinalysis with trace ketones. Otherwise unremarkable. Given her presentation, I am treating her empirically for UTI with keflex while we wait for culture.

## 2016-10-08 NOTE — Patient Instructions (Signed)
Your symptoms are consistent with UTI but your urine was unremarkable.  We are treating you while we wait on the culture.  If you worsen, go to the ER.  Take care  Dr. Lacinda Axon

## 2016-10-09 LAB — URINE CULTURE: ORGANISM ID, BACTERIA: NO GROWTH

## 2016-10-21 ENCOUNTER — Telehealth: Payer: Self-pay

## 2016-11-08 ENCOUNTER — Encounter: Payer: Self-pay | Admitting: Family Medicine

## 2016-11-08 ENCOUNTER — Ambulatory Visit (INDEPENDENT_AMBULATORY_CARE_PROVIDER_SITE_OTHER): Payer: Commercial Managed Care - PPO | Admitting: Family Medicine

## 2016-11-08 VITALS — BP 112/78 | HR 55 | Temp 97.7°F | Wt 152.8 lb

## 2016-11-08 DIAGNOSIS — E039 Hypothyroidism, unspecified: Secondary | ICD-10-CM | POA: Diagnosis not present

## 2016-11-08 DIAGNOSIS — K219 Gastro-esophageal reflux disease without esophagitis: Secondary | ICD-10-CM

## 2016-11-08 DIAGNOSIS — R252 Cramp and spasm: Secondary | ICD-10-CM

## 2016-11-08 DIAGNOSIS — E038 Other specified hypothyroidism: Secondary | ICD-10-CM

## 2016-11-08 LAB — COMPREHENSIVE METABOLIC PANEL
ALBUMIN: 3.7 g/dL (ref 3.5–5.2)
ALK PHOS: 51 U/L (ref 39–117)
ALT: 19 U/L (ref 0–35)
AST: 18 U/L (ref 0–37)
BUN: 12 mg/dL (ref 6–23)
CO2: 27 mEq/L (ref 19–32)
CREATININE: 0.66 mg/dL (ref 0.40–1.20)
Calcium: 9.1 mg/dL (ref 8.4–10.5)
Chloride: 107 mEq/L (ref 96–112)
GFR: 99.73 mL/min (ref >60.00)
Glucose, Bld: 108 mg/dL — ABNORMAL HIGH (ref 70–99)
POTASSIUM: 4.4 meq/L (ref 3.5–5.1)
SODIUM: 142 meq/L (ref 135–145)
TOTAL PROTEIN: 6.6 g/dL (ref 6.0–8.3)
Total Bilirubin: 0.3 mg/dL (ref 0.2–1.2)

## 2016-11-08 LAB — TSH: TSH: 3.34 u[IU]/mL (ref 0.35–4.50)

## 2016-11-08 NOTE — Assessment & Plan Note (Signed)
Muscle cramps potentially could be related to Synthroid though could be related to electrolyte disturbance. Discussed adequate hydration. Check electrolytes. Check TSH. Consider discontinuing Synthroid.

## 2016-11-08 NOTE — Assessment & Plan Note (Signed)
Had been tolerating the medication well. TSH is the elevated with low normal free T4. Has been asymptomatic throughout treatment and prior to treatment. Recent onset muscle cramps in her legs. Potentially could be related to the Synthroid. We'll check a TSH and consider discontinuing the Synthroid given that she has been asymptomatic.

## 2016-11-08 NOTE — Patient Instructions (Signed)
Nice to see you. We will check labs and contact you with the results.

## 2016-11-08 NOTE — Progress Notes (Signed)
  Tommi Rumps, MD Phone: [phone]  Erica Woods is a 51 y.o. female who presents today for follow-up.  Subclinical hypothyroidism: Taking Synthroid. Previous TSH is elevated and free T4 borderline. No heat or cold intolerance. No weight changes. No skin changes.  GERD: Patient notes she occasionally gets this when she eats tomatoes and chocolate. Takes Rolaids about once a week. No blood in her stool. No abdominal pain.  Patient notes since going on the Synthroid she has had some leg cramps right greater than left. Started about 5 months after being on the medication. Occur 3-4 times a week and are fairly severe. She is staying well-hydrated.  PMH: nonsmoker.   ROS see history of present illness  Objective  Physical Exam Vitals:   11/08/16 0759  BP: 112/78  Pulse: (!) 55  Temp: 97.7 F (36.5 C)  SpO2: 98%    BP Readings from Last 3 Encounters:  11/08/16 112/78  10/08/16 110/70  05/04/16 120/70   Wt Readings from Last 3 Encounters:  11/08/16 152 lb 12.8 oz (69.3 kg)  10/08/16 150 lb 6 oz (68.2 kg)  05/04/16 157 lb 6.4 oz (71.4 kg)    Physical Exam  Constitutional: No distress.  Neck: No thyromegaly present.  Cardiovascular: Normal rate, regular rhythm and normal heart sounds.   Pulmonary/Chest: Effort normal and breath sounds normal.  Abdominal: Soft. Bowel sounds are normal. She exhibits no distension. There is no tenderness.  Musculoskeletal:  Bilateral calves with no tenderness or cramps noted  Neurological: She is alert. Gait normal.  Skin: Skin is warm and dry. She is not diaphoretic.     Assessment/Plan: Please see individual problem list.  Subclinical hypothyroidism Had been tolerating the medication well. TSH is the elevated with low normal free T4. Has been asymptomatic throughout treatment and prior to treatment. Recent onset muscle cramps in her legs. Potentially could be related to the Synthroid. We'll check a TSH and consider discontinuing  the Synthroid given that she has been asymptomatic.  GERD (gastroesophageal reflux disease) Intermittent symptoms controlled by diet modification. Discussed continued diet modification. Advised adding Zantac over-the-counter if she needs something for more daily symptoms.  Muscle cramps Muscle cramps potentially could be related to Synthroid though could be related to electrolyte disturbance. Discussed adequate hydration. Check electrolytes. Check TSH. Consider discontinuing Synthroid.   Orders Placed This Encounter  Procedures  . TSH  . Comp Met (CMET)   Tommi Rumps, MD [place]

## 2016-11-08 NOTE — Assessment & Plan Note (Signed)
Intermittent symptoms controlled by diet modification. Discussed continued diet modification. Advised adding Zantac over-the-counter if she needs something for more daily symptoms.

## 2016-11-09 ENCOUNTER — Other Ambulatory Visit: Payer: Self-pay | Admitting: Family Medicine

## 2016-11-09 DIAGNOSIS — E039 Hypothyroidism, unspecified: Secondary | ICD-10-CM

## 2016-11-09 DIAGNOSIS — E038 Other specified hypothyroidism: Secondary | ICD-10-CM

## 2016-11-18 NOTE — Telephone Encounter (Signed)
Error

## 2016-11-19 ENCOUNTER — Other Ambulatory Visit: Payer: Self-pay | Admitting: *Deleted

## 2016-11-19 DIAGNOSIS — N6092 Unspecified benign mammary dysplasia of left breast: Secondary | ICD-10-CM

## 2016-11-22 ENCOUNTER — Other Ambulatory Visit (HOSPITAL_BASED_OUTPATIENT_CLINIC_OR_DEPARTMENT_OTHER): Payer: Commercial Managed Care - PPO

## 2016-11-22 ENCOUNTER — Ambulatory Visit (HOSPITAL_BASED_OUTPATIENT_CLINIC_OR_DEPARTMENT_OTHER): Payer: Commercial Managed Care - PPO | Admitting: Oncology

## 2016-11-22 ENCOUNTER — Telehealth: Payer: Self-pay | Admitting: Oncology

## 2016-11-22 VITALS — BP 121/43 | HR 61 | Temp 98.0°F | Resp 18 | Ht 64.0 in | Wt 147.7 lb

## 2016-11-22 DIAGNOSIS — Z79810 Long term (current) use of selective estrogen receptor modulators (SERMs): Secondary | ICD-10-CM

## 2016-11-22 DIAGNOSIS — N6092 Unspecified benign mammary dysplasia of left breast: Secondary | ICD-10-CM

## 2016-11-22 DIAGNOSIS — D0502 Lobular carcinoma in situ of left breast: Secondary | ICD-10-CM | POA: Diagnosis not present

## 2016-11-22 LAB — CBC WITH DIFFERENTIAL/PLATELET
BASO%: 0.7 % (ref 0.0–2.0)
BASOS ABS: 0.1 10*3/uL (ref 0.0–0.1)
EOS%: 2.5 % (ref 0.0–7.0)
Eosinophils Absolute: 0.2 10*3/uL (ref 0.0–0.5)
HCT: 40.3 % (ref 34.8–46.6)
HGB: 13.4 g/dL (ref 11.6–15.9)
LYMPH%: 30.5 % (ref 14.0–49.7)
MCH: 31.6 pg (ref 25.1–34.0)
MCHC: 33.3 g/dL (ref 31.5–36.0)
MCV: 95 fL (ref 79.5–101.0)
MONO#: 0.6 10*3/uL (ref 0.1–0.9)
MONO%: 8 % (ref 0.0–14.0)
NEUT#: 4.2 10*3/uL (ref 1.5–6.5)
NEUT%: 58.3 % (ref 38.4–76.8)
Platelets: 269 10*3/uL (ref 145–400)
RBC: 4.24 10*6/uL (ref 3.70–5.45)
RDW: 12.5 % (ref 11.2–14.5)
WBC: 7.2 10*3/uL (ref 3.9–10.3)
lymph#: 2.2 10*3/uL (ref 0.9–3.3)

## 2016-11-22 LAB — COMPREHENSIVE METABOLIC PANEL
ALK PHOS: 69 U/L (ref 40–150)
ALT: 27 U/L (ref 0–55)
ANION GAP: 8 meq/L (ref 3–11)
AST: 23 U/L (ref 5–34)
Albumin: 3.5 g/dL (ref 3.5–5.0)
BILIRUBIN TOTAL: 0.42 mg/dL (ref 0.20–1.20)
BUN: 9.1 mg/dL (ref 7.0–26.0)
CALCIUM: 9.4 mg/dL (ref 8.4–10.4)
CHLORIDE: 105 meq/L (ref 98–109)
CO2: 28 meq/L (ref 22–29)
Creatinine: 0.8 mg/dL (ref 0.6–1.1)
EGFR: 80 mL/min/{1.73_m2} — AB (ref >90)
Glucose: 97 mg/dl (ref 70–140)
POTASSIUM: 4.2 meq/L (ref 3.5–5.1)
Sodium: 141 mEq/L (ref 136–145)
Total Protein: 7.1 g/dL (ref 6.4–8.3)

## 2016-11-22 NOTE — Progress Notes (Signed)
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 This showed in the left breast some suspicious calcifications, and on 08/27/2014 the patient underwent digital left diagnostic mammography. Breast density was category C. The study showed 2 adjacent groups of pleomorphic calcifications one measuring 8 mm at the 12:30 o'clock position and another one 0.4 mm in the central left breast. The distance between these groups was 4.5 cm. There were no other findings of concern.  On 09/03/2014 Erica Woods underwent biopsy of the 2 left breast areas in question. The larger area at 12:30 o'clock showed atypical ductal hyperplasia as did the separate more central left breast lesion ([id number]).  The patient was then referred to Dr. [name] and after appropriate discussion he proceeded to needle localization excisional biopsies 2. The result of this "double lumpectomy" on 09/03/2014 showed ([place] [phone]) at the larger lesion, at 12:30 o'clock, marked with an X shaped clip, a benign complex sclerosing lesion/radial scar associated with lobular carcinoma in situ. In the more central, smaller lesion, marked with a cylinder shaped clip, there was again atypical lobular hyperplasia and lobular carcinoma in situ..  Because of these concerns, on 10/18/2014 the patient underwent bilateral breast MRIs. The right breast showed scattered benign appearing  enhancing foci. In the left breast there was a seroma involving both the upper outer and lower outer quadrant measuring up to 5.4 cm. There were no other areas of concern and no abnormal adenopathy or other ancillary findings. Because of both the right breast and left breast findings a repeat MRI of the breast in 6 months was recommended.  The patient's subsequent history is as detailed below  INTERVAL HISTORY: Erica Woods returns today for follow-up of her lobular carcinoma in situ. She is on prophylactic tamoxifen. She tolerates it well, with no vaginal discharge, and now with minimal hot flashes. She is no longer using the gabapentin. She obtains it under very good price.  Since her last visit here she had bilateral screening mammography with tomography at the [facility] 04/26/2016. This showed the breast density to be category C. There was no evidence of malignancy.   REVIEW OF SYSTEMS: Erica Woods walks for exercise, now about 2-3 miles a day. She does it at work. She does not have a step counter. The only thing to report is that she was having significant leg cramps. These appear to have resolved when her Synthroid was stopped by her primary care physician. Is currently following her TSH closely and of course if it rises again acid likely will he will start her on a different preparation  Otherwise a detailed review of systems today was benign   Past Medical History:  Diagnosis Date  . Allergic rhinitis   . Breast cancer ([place])   . Chickenpox   . GERD (gastroesophageal reflux disease)   . Headache   . Hemorrhoids   . History of blood transfusion   . Hypercholesteremia     PAST SURGICAL HISTORY: Past Surgical History:  Procedure Laterality Date  . ABDOMINAL SURGERY    . APPENDECTOMY    . BREAST BIOPSY Left 10/02/2014   Procedure: LEFT BREAST BIOPSY AFTER NEEDLE LOCALIZATION X TWO;  Surgeon: [name], MD;  Location: [facility];  Service: General;  Laterality: Left;  . BREAST EXCISIONAL  BIOPSY Left 2016  . BREAST LUMPECTOMY  2016   benign  . COLONOSCOPY N/A 10/23/2014   Procedure: COLONOSCOPY;  Surgeon: Rogene Houston, MD;  Location: [facility];  Service: Endoscopy;  Laterality: N/A;  730  . LAPAROSCOPIC APPENDECTOMY  03/02/2012   Procedure: APPENDECTOMY LAPAROSCOPIC;  Surgeon: [name], MD;  Location: [facility];  Service: General;  Laterality: N/A;  . OVARIAN CYST REMOVAL Left   . TUBAL LIGATION    . WISDOM TOOTH EXTRACTION Bilateral     FAMILY HISTORY Family History  Problem Relation Age of Onset  . Heart attack Father   . Hypertension Unknown   . Diabetes Unknown   the patient's father died from a heart attack at age 71. The patient's mother is living, at age 54. The patient had 1 full brother and one full sister, then 2 half sisters. The full sister was diagnosed with either cancer of the uterus or perhaps more likely cancer of the cervix in her 96s (the patient tells me that her sister had 2 babies after being diagnosed, so uterine cancer appears unlikely). There is no other history of breast or ovarian cancer in the family and no history of colon cancer in the family  GYNECOLOGIC HISTORY:  Patient's last menstrual period was 10/09/2014. Menarche age 52, first live birth age 75. She is GX P2. She is still having regular periods, most recently late August. ("This one is a little late". 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 Currently she is doing third shift 8 hours 5 days a week. She also sits sometimes for private clients. Her husband Erica Woods worked for the Technical sales engineer chiefly on [place]. Son Erica Woods in [name] is a Metallurgist, doing body work, and son Erica Woods in [place] is a Health and safety inspector. The patient has no grandchildren. She attends a local [place]: not in  place; the patient is aware that her husband would be her healthcare power of attorney by default in casesomething happened to her and she is comfortable with that   HEALTH MAINTENANCE: Social History  Substance Use Topics  . Smoking status: Never Smoker  . Smokeless tobacco: Never Used  . Alcohol use No     Colonoscopy: 10/23/2014; unremarkable  PAP:06/21/2014  Bone density:never  Lipid panel:  No Known Allergies  Current Outpatient Prescriptions  Medication Sig Dispense Refill  . aspirin-acetaminophen-caffeine (EXCEDRIN MIGRAINE) 250-250-65 MG tablet Take 1 tablet by mouth every 6 (six) hours as needed for headache. 30 tablet 0  . gabapentin (NEURONTIN) 100 MG capsule Take 1-2 capsules (100-200 mg total) by mouth at bedtime. 120 capsule 6  . ibuprofen (ADVIL) 200 MG tablet Take 1 tablet (200 mg total) by mouth every 6 (six) hours as needed. 30 tablet 0  . meloxicam (MOBIC) 15 MG tablet Take 15 mg by mouth.    . tamoxifen (NOLVADEX) 20 MG tablet Take 1 tablet (20 mg total) by mouth daily. 90 tablet 4   Current Facility-Administered Medications  Medication Dose Route Frequency Provider Last Rate Last Dose  . betamethasone  acetate-betamethasone sodium phosphate (CELESTONE) injection 3 mg  3 mg Intramuscular Once Daylene Katayama M, DPM      . betamethasone acetate-betamethasone sodium phosphate (CELESTONE) injection 3 mg  3 mg Intramuscular Once Edrick Kins, DPM        OBJECTIVE: middle-aged white woman In no acute distress Vitals:   11/22/16 1239  BP: (!) 121/43  Pulse: 61  Resp: 18  Temp: 98 F (36.7 C)  SpO2: 100%     Body mass index is 25.35 kg/m.    ECOG FS:0 - Asymptomatic   Sclerae unicteric, pupils round and equal Oropharynx clear and moist No cervical or supraclavicular adenopathy Lungs no rales or rhonchi Heart regular rate and rhythm Abd soft, nontender, positive bowel sounds MSK no focal spinal tenderness, no upper extremity lymphedema Neuro: nonfocal,  well oriented, appropriate affect Breasts: The right breast is benign. The left breast is status post lumpectomy, with no evidence of local recurrence. Both axillae are benign.   LAB RESULTS:  CMP     Component Value Date/Time   NA 142 11/08/2016 0817   NA 141 11/19/2015 0823   K 4.4 11/08/2016 0817   K 4.3 11/19/2015 0823   CL 107 11/08/2016 0817   CO2 27 11/08/2016 0817   CO2 26 11/19/2015 0823   GLUCOSE 108 (H) 11/08/2016 0817   GLUCOSE 111 11/19/2015 0823   BUN 12 11/08/2016 0817   BUN 12.7 11/19/2015 0823   CREATININE 0.66 11/08/2016 0817   CREATININE 0.8 11/19/2015 0823   CALCIUM 9.1 11/08/2016 0817   CALCIUM 9.5 11/19/2015 0823   PROT 6.6 11/08/2016 0817   PROT 7.3 11/19/2015 0823   ALBUMIN 3.7 11/08/2016 0817   ALBUMIN 3.4 (L) 11/19/2015 0823   AST 18 11/08/2016 0817   AST 20 11/19/2015 0823   ALT 19 11/08/2016 0817   ALT 25 11/19/2015 0823   ALKPHOS 51 11/08/2016 0817   ALKPHOS 66 11/19/2015 0823   BILITOT 0.3 11/08/2016 0817   BILITOT 0.44 11/19/2015 0823   GFRNONAA >60 09/27/2014 0950   GFRAA >60 09/27/2014 0950    INo results found for: SPEP, UPEP  Lab Results  Component Value Date   WBC 7.2 11/22/2016   NEUTROABS 4.2 11/22/2016   HGB 13.4 11/22/2016   HCT 40.3 11/22/2016   MCV 95.0 11/22/2016   PLT 269 11/22/2016      Chemistry      Component Value Date/Time   NA 142 11/08/2016 0817   NA 141 11/19/2015 0823   K 4.4 11/08/2016 0817   K 4.3 11/19/2015 0823   CL 107 11/08/2016 0817   CO2 27 11/08/2016 0817   CO2 26 11/19/2015 0823   BUN 12 11/08/2016 0817   BUN 12.7 11/19/2015 0823   CREATININE 0.66 11/08/2016 0817   CREATININE 0.8 11/19/2015 0823      Component Value Date/Time   CALCIUM 9.1 11/08/2016 0817   CALCIUM 9.5 11/19/2015 0823   ALKPHOS 51 11/08/2016 0817   ALKPHOS 66 11/19/2015 0823   AST 18 11/08/2016 0817   AST 20 11/19/2015 0823   ALT 19 11/08/2016 0817   ALT 25 11/19/2015 0823   BILITOT 0.3 11/08/2016 0817   BILITOT  0.44 11/19/2015 0823       No results found for: LABCA2  No components found for: WCBJS283  No results for input(s): INR in the last 168 hours.  Urinalysis    Component Value Date/Time   COLORURINE YELLOW 03/02/2012 [place] 03/02/2012 1740  LABSPEC <1.005 (L) 03/02/2012 1740   PHURINE 6.5 03/02/2012 1740   GLUCOSEU NEGATIVE 03/02/2012 1740   HGBUR MODERATE (A) 03/02/2012 1740   BILIRUBINUR neg 10/08/2016 1009   KETONESUR NEGATIVE 03/02/2012 1740   PROTEINUR neg 10/08/2016 1009   PROTEINUR NEGATIVE 03/02/2012 1740   UROBILINOGEN 0.2 10/08/2016 1009   UROBILINOGEN 1.0 03/02/2012 1740   NITRITE neg 10/08/2016 1009   NITRITE NEGATIVE 03/02/2012 1740   LEUKOCYTESUR Negative 10/08/2016 1009    STUDIES: Outside labs reviewed. Mammography results reviewed with patient  ASSESSMENT: 51 y.o. [place], [place] woman status post core needle biopsy 2 on 09/03/2014, both areas showing atypical ductal hyperplasia  (1) s/p Left breast excisional biopsy x2 on 10/02/2014 for a complex sclerosing lesion/radial scar associated with atypical lobular hyperplasia and lobular carcinoma is situ, and a separate area showing also lobular carcinoma in situ  (2) on prophylactic tamoxifen as of March 2017   PLAN:  Eura is now 2 years out from surgery for her noninvasive breast lesion. She is tolerating tamoxifen remarkably well and the plan will be to continue that for a total of 5 years.  I reassured her that the stinging pain she'll occasionally feels in the surgical breast are benign. Similarly the dystrophic calcification seen on the recent mammogram appear benign as well.  She will have repeat mammography in a year. She will see me again a year from now. She knows to call for any problems that may develop before the next visit here. Because of her unusual work schedule we will schedule her for a Monday morning appointment   Chauncey Cruel, MD 11/22/16 12:51 PM Medical  Oncology and Hematology [facility] [address] [place], [place] [address] Tel. [phone] Fax. [phone]  This document serves as a record of services personally performed by Lurline Del, MD. It was created on her behalf by Steva Colder, a trained medical scribe. The creation of this record is based on the scribe's personal observations and the provider's statements to them. This document has been checked and approved by the attending provider.

## 2016-11-22 NOTE — Telephone Encounter (Signed)
Gave patient avs and calendar with appts per 10/8 los °

## 2016-11-23 ENCOUNTER — Telehealth: Payer: Self-pay

## 2016-11-23 MED ORDER — TAMOXIFEN CITRATE 20 MG PO TABS: 20.0000 mg | ORAL_TABLET | Freq: Every day | ORAL | Status: DC

## 2016-11-23 MED ORDER — TAMOXIFEN CITRATE 20 MG PO TABS
20.0000 mg | ORAL_TABLET | Freq: Every day | ORAL | Status: DC
Start: 2016-11-23 — End: 2016-11-23

## 2016-11-23 NOTE — Addendum Note (Signed)
Addended by: Janace Hoard on: 11/23/2016 05:28 PM   Modules accepted: Orders

## 2016-11-23 NOTE — Telephone Encounter (Signed)
1st rx was printed. Resent as "normal". 2nd rx still printed so called it in.

## 2016-11-23 NOTE — Telephone Encounter (Signed)
Pt called for tamoxifen refill. Done per OV note 11/22/16.

## 2016-11-24 ENCOUNTER — Telehealth: Payer: Self-pay | Admitting: *Deleted

## 2016-12-03 NOTE — Telephone Encounter (Signed)
No entry 

## 2016-12-21 ENCOUNTER — Other Ambulatory Visit (INDEPENDENT_AMBULATORY_CARE_PROVIDER_SITE_OTHER): Payer: Commercial Managed Care - PPO

## 2016-12-21 DIAGNOSIS — E038 Other specified hypothyroidism: Secondary | ICD-10-CM

## 2016-12-21 DIAGNOSIS — E039 Hypothyroidism, unspecified: Secondary | ICD-10-CM | POA: Diagnosis not present

## 2016-12-21 LAB — T4, FREE: FREE T4: 0.62 ng/dL (ref 0.60–1.60)

## 2016-12-21 LAB — TSH: TSH: 22.01 u[IU]/mL — ABNORMAL HIGH (ref 0.35–4.50)

## 2016-12-29 ENCOUNTER — Telehealth: Payer: Self-pay | Admitting: Family Medicine

## 2016-12-29 MED ORDER — THYROID 30 MG PO TABS: 30.0000 mg | ORAL_TABLET | Freq: Every day | ORAL | 0 refills | Status: DC

## 2016-12-29 NOTE — Telephone Encounter (Signed)
Copied from [place] [id number]. Topic: Quick Communication - See Telephone Encounter >> Dec 29, 2016  9:42 AM Ether Griffins B wrote: CRM for notification. See Telephone encounter for:  Pt stating Erica Woods ordering armor thyroid 30 mg and scheduling a follow up with dr Caryl Bis in 2 weeks once she starts medication   12/29/16.

## 2016-12-29 NOTE — Telephone Encounter (Signed)
rx sent to pharmacy, patient notified and scheduled for follow up with pcp 01/28/17 first available

## 2016-12-29 NOTE — Telephone Encounter (Signed)
Please advise 

## 2017-01-28 ENCOUNTER — Other Ambulatory Visit: Payer: Self-pay

## 2017-01-28 ENCOUNTER — Other Ambulatory Visit: Payer: Self-pay | Admitting: Family Medicine

## 2017-01-28 ENCOUNTER — Encounter: Payer: Self-pay | Admitting: Family Medicine

## 2017-01-28 ENCOUNTER — Ambulatory Visit: Payer: Commercial Managed Care - PPO | Admitting: Family Medicine

## 2017-01-28 VITALS — BP 108/70 | HR 70 | Temp 98.1°F | Wt 151.4 lb

## 2017-01-28 DIAGNOSIS — R1310 Dysphagia, unspecified: Secondary | ICD-10-CM

## 2017-01-28 DIAGNOSIS — E038 Other specified hypothyroidism: Secondary | ICD-10-CM

## 2017-01-28 DIAGNOSIS — K219 Gastro-esophageal reflux disease without esophagitis: Secondary | ICD-10-CM

## 2017-01-28 DIAGNOSIS — R252 Cramp and spasm: Secondary | ICD-10-CM | POA: Diagnosis not present

## 2017-01-28 DIAGNOSIS — E039 Hypothyroidism, unspecified: Secondary | ICD-10-CM

## 2017-01-28 LAB — TSH: TSH: 6.35 u[IU]/mL — ABNORMAL HIGH (ref 0.35–4.50)

## 2017-01-28 MED ORDER — OMEPRAZOLE 20 MG PO CPDR
20.0000 mg | DELAYED_RELEASE_CAPSULE | Freq: Every day | ORAL | 1 refills | Status: DC
Start: 2017-01-28 — End: 2017-02-01

## 2017-01-28 MED ORDER — THYROID 30 MG PO TABS: 45.0000 mg | ORAL_TABLET | Freq: Every day | ORAL | 3 refills | Status: DC

## 2017-01-28 NOTE — Patient Instructions (Signed)
Nice to see you. We will check your thyroid function test and then send in a new prescription for you. We will get you referred to GI.  If you get to the point where you cannot swallow please be evaluated.

## 2017-01-28 NOTE — Progress Notes (Signed)
  Erica Rumps, MD Phone: [phone]  Erica Woods is a 51 y.o. female who presents today for follow-up.  Hypothyroidism: Taking Armour Thyroid now.  She has tolerated this much better.  No skin changes.  No heat or cold intolerance.  No weight changes.  Does note her appetite has increased with this medication.  Muscle cramps have resolved.  She also reports recently she has felt as though pills and dry food sticks halfway down her throat and into her esophagus.  It eventually does go down.  No liquid issues.  Does have reflux that typically occurs about an hour after she eats something that she knows she should not.  No blood in her stool.  No abdominal pain.  No prior EGD.  Not on medication for this.  Social History   Tobacco Use  Smoking Status Never Smoker  Smokeless Tobacco Never Used     ROS see history of present illness  Objective  Physical Exam Vitals:   01/28/17 1311  BP: 108/70  Pulse: 70  Temp: 98.1 F (36.7 C)  SpO2: 97%    BP Readings from Last 3 Encounters:  01/28/17 108/70  11/22/16 (!) 121/43  11/08/16 112/78   Wt Readings from Last 3 Encounters:  01/28/17 151 lb 6.4 oz (68.7 kg)  11/22/16 147 lb 11.2 oz (67 kg)  11/08/16 152 lb 12.8 oz (69.3 kg)    Physical Exam  Constitutional: No distress.  Cardiovascular: Normal rate, regular rhythm and normal heart sounds.  Pulmonary/Chest: Effort normal and breath sounds normal.  Abdominal: Soft. Bowel sounds are normal. She exhibits no distension. There is no tenderness. There is no rebound and no guarding.  Musculoskeletal: She exhibits no edema.  Neurological: She is alert. Gait normal.  Skin: Skin is warm and dry. She is not diaphoretic.     Assessment/Plan: Please see individual problem list.  Subclinical hypothyroidism Most recent TSH very elevated.  Started on Armour Thyroid.  Due for TSH recheck.  We will send in refills after lab results return.  GERD (gastroesophageal reflux  disease) Patient with reflux symptoms as well as possible dysphagia with food sticking.  We will start her on Prilosec.  We will refer to GI for further evaluation.   Erica Woods was seen today for follow-up.  Diagnoses and all orders for this visit:  Hypothyroidism, unspecified type -     TSH  Dysphagia, unspecified type -     Ambulatory referral to Gastroenterology  Subclinical hypothyroidism  Gastroesophageal reflux disease, esophagitis presence not specified  Other orders -     omeprazole (PRILOSEC) 20 MG capsule; Take 1 capsule (20 mg total) by mouth daily.    Orders Placed This Encounter  Procedures  . TSH  . Ambulatory referral to Gastroenterology    Referral Priority:   Routine    Referral Type:   Consultation    Referral Reason:   Specialty Services Required    Number of Visits Requested:   1    Meds ordered this encounter  Medications  . omeprazole (PRILOSEC) 20 MG capsule    Sig: Take 1 capsule (20 mg total) by mouth daily.    Dispense:  30 capsule    Refill:  [place], MD [place]

## 2017-01-28 NOTE — Assessment & Plan Note (Signed)
Most recent TSH very elevated.  Started on Armour Thyroid.  Due for TSH recheck.  We will send in refills after lab results return.

## 2017-01-28 NOTE — Assessment & Plan Note (Signed)
Resolved

## 2017-01-28 NOTE — Assessment & Plan Note (Signed)
Patient with reflux symptoms as well as possible dysphagia with food sticking.  We will start her on Prilosec.  We will refer to GI for further evaluation.

## 2017-02-01 ENCOUNTER — Other Ambulatory Visit: Payer: Self-pay

## 2017-02-01 ENCOUNTER — Ambulatory Visit (INDEPENDENT_AMBULATORY_CARE_PROVIDER_SITE_OTHER): Payer: Commercial Managed Care - PPO | Admitting: Gastroenterology

## 2017-02-01 ENCOUNTER — Encounter: Payer: Self-pay | Admitting: Gastroenterology

## 2017-02-01 VITALS — BP 134/83 | HR 61 | Temp 98.1°F | Ht 64.0 in | Wt 151.0 lb

## 2017-02-01 DIAGNOSIS — R1310 Dysphagia, unspecified: Secondary | ICD-10-CM | POA: Diagnosis not present

## 2017-02-01 MED ORDER — OMEPRAZOLE 40 MG PO CPDR: 40.0000 mg | DELAYED_RELEASE_CAPSULE | Freq: Every day | ORAL | 3 refills | Status: DC

## 2017-02-01 NOTE — Progress Notes (Signed)
[name] MD, MRCP(U.K) [address]  [place]  [place], [place] [address]  Main: [phone]  Fax: [phone]   Gastroenterology Consultation  Referring Provider:     Leone Haven, MD Primary Care Physician:  Leone Haven, MD Primary Gastroenterologist:  Dr. [name]  Reason for Consultation:     Dysphagia        HPI:   Erica Woods is a 51 y.o. y/o female referred for consultation & management  by Dr. Caryl Bis, Angela Adam, MD.     She has been referred for difficulty with swallowing   Dysphagia: Onset and any progression: 2-3 years- getting worse Frequency: depending on what she eats- if she eats dry meat has issues, feels gets stuck at her neck. Denies any coughing , pills also get stuck. No issues with liquids  Foods affected : as above  Prior episodes of impaction: no  History of asthma/allergy : none  History of heartburn/Reflux : yes-she has had heartburn for about 6 years, presently is controlled ,  Weight loss/weight gain : no   Prior EGD: no  PPI/H2 blocker use : none    She has had a colonoscopy 2 years- was normal. Next colonoscopy in 10 years- was done at an outside hospital .  No smoking or history of esophageal cancer     Past Medical History:  Diagnosis Date  . Allergic rhinitis   . Breast cancer ([place])   . Chickenpox   . GERD (gastroesophageal reflux disease)   . Headache   . Hemorrhoids   . History of blood transfusion   . Hypercholesteremia     Past Surgical History:  Procedure Laterality Date  . ABDOMINAL SURGERY    . APPENDECTOMY    . BREAST BIOPSY Left 10/02/2014   Procedure: LEFT BREAST BIOPSY AFTER NEEDLE LOCALIZATION X TWO;  Surgeon: [name], MD;  Location: [facility];  Service: General;  Laterality: Left;  . BREAST EXCISIONAL BIOPSY Left 2016  . BREAST LUMPECTOMY  2016   benign  . COLONOSCOPY N/A 10/23/2014   Procedure: COLONOSCOPY;  Surgeon: Rogene Houston, MD;  Location: [facility];  Service:  Endoscopy;  Laterality: N/A;  730  . LAPAROSCOPIC APPENDECTOMY  03/02/2012   Procedure: APPENDECTOMY LAPAROSCOPIC;  Surgeon: [name], MD;  Location: [facility];  Service: General;  Laterality: N/A;  . OVARIAN CYST REMOVAL Left   . TUBAL LIGATION    . WISDOM TOOTH EXTRACTION Bilateral     Prior to Admission medications   Medication Sig Start Date End Date Taking? Authorizing Provider  levothyroxine (SYNTHROID, LEVOTHROID) 50 MCG tablet TAKE ONE TABLET BY MOUTH ONCE DAILY 05/24/16  Yes [provider]  aspirin-acetaminophen-caffeine (EXCEDRIN MIGRAINE) 250-250-65 MG tablet Take 1 tablet by mouth every 6 (six) hours as needed for headache. 11/25/14   Magrinat, Virgie Dad, MD  gabapentin (NEURONTIN) 100 MG capsule Take 1-2 capsules (100-200 mg total) by mouth at bedtime. 11/19/15   Magrinat, Virgie Dad, MD  ibuprofen (ADVIL) 200 MG tablet Take 1 tablet (200 mg total) by mouth every 6 (six) hours as needed. 11/25/14   Magrinat, Virgie Dad, MD  meloxicam (MOBIC) 15 MG tablet Take 15 mg by mouth.    [provider]  omeprazole (PRILOSEC) 20 MG capsule Take 1 capsule (20 mg total) by mouth daily. 01/28/17   Leone Haven, MD  potassium chloride (K-DUR) 10 MEQ tablet Take by mouth.    [provider]  tamoxifen (NOLVADEX) 20 MG  tablet Take 1 tablet (20 mg total) by mouth daily. 11/23/16   Magrinat, Virgie Dad, MD  thyroid [facility] THYROID) 30 MG tablet Take 1.5 tablets (45 mg total) by mouth daily before breakfast. 01/28/17 02/27/17  Leone Haven, MD    Family History  Problem Relation Age of Onset  . Heart attack Father   . Hypertension Unknown   . Diabetes Unknown      Social History   Tobacco Use  . Smoking status: Never Smoker  . Smokeless tobacco: Never Used  Substance Use Topics  . Alcohol use: No  . Drug use: No    Allergies as of 02/01/2017  . (No Known Allergies)    Review of Systems:    All systems reviewed and negative except where noted in  HPI.   Physical Exam:  LMP 10/09/2014  Patient's last menstrual period was 10/09/2014. Psych:  Alert and cooperative. Normal mood and affect. General:   Alert,  Well-developed, well-nourished, pleasant and cooperative in NAD Head:  Normocephalic and atraumatic. Eyes:  Sclera clear, no icterus.   Conjunctiva pink. Ears:  Normal auditory acuity. Nose:  No deformity, discharge, or lesions. Mouth:  No deformity or lesions,oropharynx pink & moist. Neck:  Supple; no masses or thyromegaly. Lungs:  Respirations even and unlabored.  Clear throughout to auscultation.   No wheezes, crackles, or rhonchi. No acute distress. Heart:  Regular rate and rhythm; no murmurs, clicks, rubs, or gallops. Abdomen:  Normal bowel sounds.  No bruits.  Soft, non-tender and non-distended without masses, hepatosplenomegaly or hernias noted.  No guarding or rebound tenderness.    Neurologic:  Alert and oriented x3;  grossly normal neurologically. Skin:  Intact without significant lesions or rashes. No jaundice. Lymph Nodes:  No significant cervical adenopathy. Psych:  Alert and cooperative. Normal mood and affect.  Imaging Studies: No results found.  Assessment and Plan:   Erica Woods is a 51 y.o. y/o female has been referred for dysphagia. Her history is suggestive of GERD  Plan  1. EGD+/- dilation . Will discuss about GERD in detail at her next visit.   I have discussed alternative options, risks & benefits,  which include, but are not limited to, bleeding, infection, perforation,respiratory complication & drug reaction.  The patient agrees with this plan & written consent will be obtained.     Follow up in 8 weeks   Dr [name] MD,MRCP(U.K)

## 2017-02-01 NOTE — Addendum Note (Signed)
Addended by: Vanetta Mulders on: 02/01/2017 10:36 AM   Modules accepted: Orders

## 2017-02-02 ENCOUNTER — Other Ambulatory Visit: Payer: Self-pay

## 2017-02-02 DIAGNOSIS — R1310 Dysphagia, unspecified: Secondary | ICD-10-CM

## 2017-02-04 ENCOUNTER — Ambulatory Visit: Payer: Commercial Managed Care - PPO | Admitting: Anesthesiology

## 2017-02-04 ENCOUNTER — Encounter: Payer: Self-pay | Admitting: *Deleted

## 2017-02-04 ENCOUNTER — Ambulatory Visit
Admission: RE | Admit: 2017-02-04 | Discharge: 2017-02-04 | Disposition: A | Discharge status: Home / Self Care | Payer: Commercial Managed Care - PPO | Source: Ambulatory Visit | Attending: Gastroenterology | Admitting: Gastroenterology

## 2017-02-04 ENCOUNTER — Encounter
Admission: RE | Disposition: A | Discharge status: Home / Self Care | Payer: Self-pay | Source: Ambulatory Visit | Attending: Gastroenterology

## 2017-02-04 DIAGNOSIS — K222 Esophageal obstruction: Secondary | ICD-10-CM

## 2017-02-04 DIAGNOSIS — Z79899 Other long term (current) drug therapy: Secondary | ICD-10-CM | POA: Diagnosis not present

## 2017-02-04 DIAGNOSIS — Z853 Personal history of malignant neoplasm of breast: Secondary | ICD-10-CM | POA: Insufficient documentation

## 2017-02-04 DIAGNOSIS — R1310 Dysphagia, unspecified: Secondary | ICD-10-CM

## 2017-02-04 DIAGNOSIS — K200 Eosinophilic esophagitis: Secondary | ICD-10-CM | POA: Diagnosis not present

## 2017-02-04 DIAGNOSIS — Z7982 Long term (current) use of aspirin: Secondary | ICD-10-CM | POA: Insufficient documentation

## 2017-02-04 DIAGNOSIS — E7800 Pure hypercholesterolemia, unspecified: Secondary | ICD-10-CM | POA: Insufficient documentation

## 2017-02-04 DIAGNOSIS — K219 Gastro-esophageal reflux disease without esophagitis: Secondary | ICD-10-CM | POA: Insufficient documentation

## 2017-02-04 DIAGNOSIS — E039 Hypothyroidism, unspecified: Secondary | ICD-10-CM | POA: Diagnosis not present

## 2017-02-04 HISTORY — DX: Thyrotoxicosis, unspecified without thyrotoxic crisis or storm: E05.90

## 2017-02-04 HISTORY — PX: BALLOON DILATION: SHX5330

## 2017-02-04 HISTORY — PX: ESOPHAGOGASTRODUODENOSCOPY (EGD) WITH PROPOFOL: SHX5813

## 2017-02-04 SURGERY — ESOPHAGOGASTRODUODENOSCOPY (EGD) WITH PROPOFOL
Anesthesia: General

## 2017-02-04 MED ORDER — LIDOCAINE HCL (PF) 2 % IJ SOLN
INTRAMUSCULAR | Status: AC
  Filled 2017-02-04: qty 10

## 2017-02-04 MED ORDER — SODIUM CHLORIDE 0.9 % IV SOLN
INTRAVENOUS | Status: DC
  Administered 2017-02-04: 1000 mL via INTRAVENOUS
  Administered 2017-02-04: 09:00:00 via INTRAVENOUS

## 2017-02-04 MED ORDER — PROPOFOL 500 MG/50ML IV EMUL
INTRAVENOUS | Status: AC
  Filled 2017-02-04: qty 50

## 2017-02-04 MED ORDER — LIDOCAINE HCL (CARDIAC) 20 MG/ML IV SOLN
INTRAVENOUS | Status: DC | PRN
  Administered 2017-02-04: 100 mg via INTRAVENOUS

## 2017-02-04 MED ORDER — PROPOFOL 10 MG/ML IV BOLUS
INTRAVENOUS | Status: DC | PRN
  Administered 2017-02-04: 70 mg via INTRAVENOUS

## 2017-02-04 MED ORDER — PROPOFOL 500 MG/50ML IV EMUL
INTRAVENOUS | Status: DC | PRN
  Administered 2017-02-04: 150 ug/kg/min via INTRAVENOUS

## 2017-02-04 NOTE — Anesthesia Postprocedure Evaluation (Signed)
Anesthesia Post Note  Patient: Erica Woods  Procedure(s) Performed: ESOPHAGOGASTRODUODENOSCOPY (EGD) WITH PROPOFOL (N/A ) BALLOON DILATION (N/A )  Patient location during evaluation: PACU Anesthesia Type: General Level of consciousness: awake and alert and oriented Pain management: pain level controlled Vital Signs Assessment: post-procedure vital signs reviewed and stable Respiratory status: spontaneous breathing Cardiovascular status: blood pressure returned to baseline Anesthetic complications: no     Last Vitals:  Vitals:   02/04/17 0930 02/04/17 0950  BP: 109/61 109/62  Pulse: 70 68  Resp: 16 (!) 22  Temp: (!) 36.1 C   SpO2: 98% 97%    Last Pain:  Vitals:   02/04/17 0930  TempSrc: Tympanic                 Derenda Giddings

## 2017-02-04 NOTE — Transfer of Care (Signed)
Immediate Anesthesia Transfer of Care Note  Patient: Erica Woods  Procedure(s) Performed: ESOPHAGOGASTRODUODENOSCOPY (EGD) WITH PROPOFOL (N/A ) BALLOON DILATION (N/A )  Patient Location: PACU  Anesthesia Type:General  Level of Consciousness: sedated  Airway & Oxygen Therapy: Patient Spontanous Breathing and Patient connected to nasal cannula oxygen  Post-op Assessment: Report given to RN and Post -op Vital signs reviewed and stable  Post vital signs: Reviewed and stable  Last Vitals:  Vitals:   02/04/17 0819  BP: 131/67  Pulse: 76  Resp: 18  Temp: (!) 35.7 C  SpO2: 100%    Last Pain:  Vitals:   02/04/17 0819  TempSrc: Tympanic         Complications: No apparent anesthesia complications

## 2017-02-04 NOTE — Anesthesia Procedure Notes (Signed)
Date/Time: 02/04/2017 9:17 AM Performed by: Nelda Marseille, CRNA Pre-anesthesia Checklist: Patient identified, Emergency Drugs available, Suction available, Patient being monitored and Timeout performed Oxygen Delivery Method: Nasal cannula

## 2017-02-04 NOTE — Anesthesia Post-op Follow-up Note (Signed)
Anesthesia QCDR form completed.        

## 2017-02-04 NOTE — H&P (Signed)
[name], MD [address], [place], [place], [place], [address] [address], [place], [place], [place], [address] Phone: [phone]  Fax: [phone]  Primary Care Physician:  Leone Haven, MD   Pre-Procedure History & Physical: HPI:  Erica Woods is a 51 y.o. female is here for an endoscopy    Past Medical History:  Diagnosis Date  . Allergic rhinitis   . Breast cancer ([place])   . Chickenpox   . GERD (gastroesophageal reflux disease)   . Headache   . Hemorrhoids   . History of blood transfusion   . Hypercholesteremia     Past Surgical History:  Procedure Laterality Date  . ABDOMINAL SURGERY    . APPENDECTOMY    . BREAST BIOPSY Left 10/02/2014   Procedure: LEFT BREAST BIOPSY AFTER NEEDLE LOCALIZATION X TWO;  Surgeon: [name], MD;  Location: [facility];  Service: General;  Laterality: Left;  . BREAST EXCISIONAL BIOPSY Left 2016  . BREAST LUMPECTOMY  2016   benign  . COLONOSCOPY N/A 10/23/2014   Procedure: COLONOSCOPY;  Surgeon: Rogene Houston, MD;  Location: [facility];  Service: Endoscopy;  Laterality: N/A;  730  . LAPAROSCOPIC APPENDECTOMY  03/02/2012   Procedure: APPENDECTOMY LAPAROSCOPIC;  Surgeon: [name], MD;  Location: [facility];  Service: General;  Laterality: N/A;  . OVARIAN CYST REMOVAL Left   . TUBAL LIGATION    . WISDOM TOOTH EXTRACTION Bilateral     Prior to Admission medications   Medication Sig Start Date End Date Taking? Authorizing Provider  aspirin-acetaminophen-caffeine (EXCEDRIN MIGRAINE) [phone] MG tablet Take 1 tablet by mouth every 6 (six) hours as needed for headache. 11/25/14   Magrinat, Virgie Dad, MD  gabapentin (NEURONTIN) 100 MG capsule Take 1-2 capsules (100-200 mg total) by mouth at bedtime. 11/19/15   Magrinat, Virgie Dad, MD  ibuprofen (ADVIL) 200 MG tablet Take 1 tablet (200 mg total) by mouth every 6 (six) hours as needed. 11/25/14   Magrinat, Virgie Dad, MD  meloxicam (MOBIC) 15 MG tablet Take 15 mg by mouth.     [provider]  omeprazole (PRILOSEC) 40 MG capsule Take 1 capsule (40 mg total) by mouth daily. 02/01/17   [name], MD  tamoxifen (NOLVADEX) 20 MG tablet Take 1 tablet (20 mg total) by mouth daily. 11/23/16   Magrinat, Virgie Dad, MD  thyroid [facility] THYROID) 30 MG tablet Take 1.5 tablets (45 mg total) by mouth daily before breakfast. 01/28/17 02/27/17  Leone Haven, MD    Allergies as of 02/01/2017  . (No Known Allergies)    Family History  Problem Relation Age of Onset  . Heart attack Father   . Hypertension Unknown   . Diabetes Unknown     Social History   Socioeconomic History  . Marital status: Married    Spouse name: Not on file  . Number of children: Not on file  . Years of education: Not on file  . Highest education level: Not on file  Social Needs  . Financial resource strain: Not on file  . Food insecurity - worry: Not on file  . Food insecurity - inability: Not on file  . Transportation needs - medical: Not on file  . Transportation needs - non-medical: Not on file  Occupational History  . Not on file  Tobacco Use  . Smoking status: Never Smoker  . Smokeless tobacco: Never Used  Substance and Sexual Activity  . Alcohol use: No  .  Drug use: No  . Sexual activity: Not on file  Other Topics Concern  . Not on file  Social History Narrative  . Not on file    Review of Systems: See HPI, otherwise negative ROS  Physical Exam: LMP 10/09/2014  General:   Alert,  pleasant and cooperative in NAD Head:  Normocephalic and atraumatic. Neck:  Supple; no masses or thyromegaly. Lungs:  Clear throughout to auscultation, normal respiratory effort.    Heart:  +S1, +S2, Regular rate and rhythm, No edema. Abdomen:  Soft, nontender and nondistended. Normal bowel sounds, without guarding, and without rebound.   Neurologic:  Alert and  oriented x4;  grossly normal neurologically.  Impression/Plan: Erica Woods is here for an endoscopy  to be performed  for  evaluation of dysphagia    Risks, benefits, limitations, and alternatives regarding endoscopy and dilation  have been reviewed with the patient.  Questions have been answered.  All parties agreeable.   [name], MD  02/04/2017, 8:10 AM

## 2017-02-04 NOTE — Op Note (Signed)
[facility] Gastroenterology Patient Name: Erica Woods Procedure Date: 02/04/2017 9:00 AM MRN: [id number] Account #: [id number] Date of Birth: 1965-02-27 Admit Type: Outpatient Age: 51 Room: [facility] ENDO ROOM 4 Gender: Female Note Status: Finalized Procedure:            Upper GI endoscopy Indications:          Dysphagia Providers:            [name] MD, MD Referring MD:         Angela Adam. Caryl Bis (Referring MD) Medicines:            Monitored Anesthesia Care Complications:        No immediate complications. Procedure:            Pre-Anesthesia Assessment:                       - Prior to the procedure, a History and Physical was                        performed, and patient medications, allergies and                        sensitivities were reviewed. The patient's tolerance of                        previous anesthesia was reviewed.                       - The risks and benefits of the procedure and the                        sedation options and risks were discussed with the                        patient. All questions were answered and informed                        consent was obtained.                       - ASA Grade Assessment: II - A patient with mild                        systemic disease.                       After obtaining informed consent, the endoscope was                        passed under direct vision. Throughout the procedure,                        the patient's blood pressure, pulse, and oxygen                        saturations were monitored continuously. The Endoscope                        was introduced through the mouth, and advanced to the  third part of duodenum. The upper GI endoscopy was                        accomplished with ease. The patient tolerated the                        procedure well. Findings:      The examined duodenum was normal.      The stomach was normal.      A non-obstructing and  mild Schatzki ring (acquired) was found at the       gastroesophageal junction. A TTS dilator was passed through the scope.       Dilation with a 15-16.5-18 mm balloon dilator was performed to 18 mm.       The dilation site was examined and showed no change.      Normal mucosa was found in the entire esophagus. This was biopsied with       a cold forceps for evaluation of eosinophilic esophagitis.      The exam was otherwise without abnormality. Impression:           - Normal examined duodenum.                       - Normal stomach.                       - Non-obstructing and mild Schatzki ring. Dilated.                       - Normal mucosa was found in the entire esophagus.                        Biopsied.                       - The examination was otherwise normal. Recommendation:       - Discharge patient to home (with escort).                       - Resume previous diet.                       - Continue present medications.                       - Await pathology results.                       - Return to my office in 4 weeks. Procedure Code(s):    --- Professional ---                       [phone], Esophagogastroduodenoscopy, flexible, transoral;                        with transendoscopic balloon dilation of esophagus                        (less than 30 mm diameter)                       43239, Esophagogastroduodenoscopy, flexible, transoral;  with biopsy, single or multiple Diagnosis Code(s):    --- Professional ---                       K22.2, Esophageal obstruction                       R13.10, Dysphagia, unspecified CPT copyright 2016 American Medical Association. All rights reserved. The codes documented in this report are preliminary and upon coder review may  be revised to meet current compliance requirements. [name], MD [name] MD, MD 02/04/2017 9:24:16 AM This report has been signed electronically. Number of Addenda: 0 Note Initiated  On: 02/04/2017 9:00 AM      [facility]

## 2017-02-04 NOTE — Anesthesia Preprocedure Evaluation (Addendum)
Anesthesia Evaluation  Patient identified by MRN, date of birth, ID band Patient awake    Reviewed: Allergy & Precautions, H&P , NPO status , Patient's Chart, lab work & pertinent test results  History of Anesthesia Complications Negative for: history of anesthetic complications  Airway Mallampati: I  TM Distance: >3 FB Neck ROM: Full    Dental  (+) Teeth Intact, Dental Advisory Given   Pulmonary neg pulmonary ROS,    breath sounds clear to auscultation       Cardiovascular Exercise Tolerance: Good Normal cardiovascular exam     Neuro/Psych  Headaches, negative neurological ROS  negative psych ROS   GI/Hepatic GERD  Poorly Controlled,  Endo/Other  Hypothyroidism Hyperthyroidism   Renal/GU   negative genitourinary   Musculoskeletal negative musculoskeletal ROS (+)   Abdominal Normal abdominal exam  (+)   Peds  Hematology negative hematology ROS (+)   Anesthesia Other Findings   Reproductive/Obstetrics                             Anesthesia Physical  Anesthesia Plan  ASA: II  Anesthesia Plan: General   Post-op Pain Management:    Induction:   PONV Risk Score and Plan:   Airway Management Planned: Nasal Cannula  Additional Equipment:   Intra-op Plan:   Post-operative Plan:   Informed Consent: I have reviewed the patients History and Physical, chart, labs and discussed the procedure including the risks, benefits and alternatives for the proposed anesthesia with the patient or authorized representative who has indicated his/her understanding and acceptance.   Dental advisory given  Plan Discussed with: CRNA and Surgeon  Anesthesia Plan Comments:         Anesthesia Quick Evaluation

## 2017-02-07 LAB — SURGICAL PATHOLOGY

## 2017-02-09 ENCOUNTER — Encounter: Payer: Self-pay | Admitting: Gastroenterology

## 2017-02-17 ENCOUNTER — Telehealth: Payer: Self-pay

## 2017-02-17 NOTE — Telephone Encounter (Signed)
Patient has been informed that the biopsies from EGD are suggestive of eosinophilic esophagitis which is an allergic condition similar to asthma but affecting the esophagus. This will be discussed further at the office visit she should see me within 3 weeks. This is cause of her dysphagia.  She has been asked to  continue her PPI.  Thanks [employer]

## 2017-02-17 NOTE — Telephone Encounter (Signed)
-----   Message from [name], MD sent at 02/07/2017  1:15 PM EST ----- Sharyn Lull please inform patient that the biopsies from EGD are suggestive of eosinophilic esophagitis which is an allergic condition similar to asthma but affecting the esophagus. We will discuss further at the office visit she should see me within 3 weeks. This is cause of her dysphagia. Continue PPI   C/c Leone Haven, MD

## 2017-03-14 ENCOUNTER — Ambulatory Visit (INDEPENDENT_AMBULATORY_CARE_PROVIDER_SITE_OTHER): Payer: Commercial Managed Care - PPO | Admitting: Gastroenterology

## 2017-03-14 ENCOUNTER — Encounter: Payer: Self-pay | Admitting: Gastroenterology

## 2017-03-14 VITALS — BP 120/74 | HR 73 | Temp 98.1°F | Ht 64.0 in | Wt 152.0 lb

## 2017-03-14 DIAGNOSIS — K200 Eosinophilic esophagitis: Secondary | ICD-10-CM

## 2017-03-14 NOTE — Progress Notes (Signed)
[name] MD, MRCP(U.K) [address]  [place]  [place], [place] [address]  Main: [phone]  Fax: [phone]   Primary Care Physician: Leone Haven, MD  Primary Gastroenterologist:  Dr. [name]   No chief complaint on file.   HPI: Erica Woods is a 52 y.o. female   Summary of history :  She was initially referred and seen in 01/2017 for dysphagia which has been ongoing for 2-3 years and getting worse, solid foods get stuck, heartburn last 6 years. She has had a colonoscopy 2 years- was normal. Next colonoscopy in 10 years- was done at an outside hospital .  Interval history   02/01/2017-  03/14/2016   02/04/17: EGD A non-obstructing and mild Schatzki ring (acquired) was found at the gastroesophageal junction. A TTS dilator was passed through the scope. Dilation with a 15-16.5-18 mm balloon dilator was performed to 18 mm.The biopsies from EGD are suggestive of eosinophilic esophagitis 15 eosinophils phpf.  She is able to eat everything since dilation . Omeprazole was started just prior to the endoscopy and continues to take it.     Current Outpatient Medications  Medication Sig Dispense Refill  . aspirin-acetaminophen-caffeine (EXCEDRIN MIGRAINE) 250-250-65 MG tablet Take 1 tablet by mouth every 6 (six) hours as needed for headache. 30 tablet 0  . gabapentin (NEURONTIN) 100 MG capsule Take 1-2 capsules (100-200 mg total) by mouth at bedtime. 120 capsule 6  . ibuprofen (ADVIL) 200 MG tablet Take 1 tablet (200 mg total) by mouth every 6 (six) hours as needed. 30 tablet 0  . meloxicam (MOBIC) 15 MG tablet Take 15 mg by mouth.    Marland Kitchen omeprazole (PRILOSEC) 40 MG capsule Take 1 capsule (40 mg total) by mouth daily. 90 capsule 3  . tamoxifen (NOLVADEX) 20 MG tablet Take 1 tablet (20 mg total) by mouth daily. 90 tablet 3.  . thyroid (ARMOUR THYROID) 30 MG tablet Take 1.5 tablets (45 mg total) by mouth daily before breakfast. 45 tablet 3   No current  facility-administered medications for this visit.     Allergies as of 03/14/2017  . (No Known Allergies)    ROS:  General: Negative for anorexia, weight loss, fever, chills, fatigue, weakness. ENT: Negative for hoarseness, difficulty swallowing , nasal congestion. CV: Negative for chest pain, angina, palpitations, dyspnea on exertion, peripheral edema.  Respiratory: Negative for dyspnea at rest, dyspnea on exertion, cough, sputum, wheezing.  GI: See history of present illness. GU:  Negative for dysuria, hematuria, urinary incontinence, urinary frequency, nocturnal urination.  Endo: Negative for unusual weight change.    Physical Examination:   LMP 10/09/2014   General: Well-nourished, well-developed in no acute distress.  Eyes: No icterus. Conjunctivae pink. Mouth: Oropharyngeal mucosa moist and pink , no lesions erythema or exudate. Lungs: Clear to auscultation bilaterally. Non-labored. Heart: Regular rate and rhythm, no murmurs rubs or gallops.  Abdomen: Bowel sounds are normal, nontender, nondistended, no hepatosplenomegaly or masses, no abdominal bruits or hernia , no rebound or guarding.   Extremities: No lower extremity edema. No clubbing or deformities. Neuro: Alert and oriented x 3.  Grossly intact. Skin: Warm and dry, no jaundice.   Psych: Alert and cooperative, normal mood and affect.   Imaging Studies: No results found.  Assessment and Plan:   Erica Woods is a 52 y.o. 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 . H/o GERD. Dysphagia has resolved since commence on PPI and dilation of mild  stricture. Since not having symptoms will not commence on Fluticasone, she may be responding to the PPI.   Plan  1. Allergy testing -will refer to [facility] 2. Patient information provided on eosinophilic esophagitis.  3. Repeat EGD with biopsies in 6-8 months to ensure eosinophils of the esophagus has resolved.    Dr [name]   MD,MRCP [facility]) Follow up in 6 months

## 2017-03-14 NOTE — Addendum Note (Signed)
Addended by: Peggye Ley on: 03/14/2017 04:07 PM   Modules accepted: Orders

## 2017-04-05 ENCOUNTER — Other Ambulatory Visit: Payer: Self-pay | Admitting: Oncology

## 2017-04-05 DIAGNOSIS — Z139 Encounter for screening, unspecified: Secondary | ICD-10-CM

## 2017-04-27 ENCOUNTER — Ambulatory Visit
Admission: RE | Admit: 2017-04-27 | Discharge: 2017-04-27 | Disposition: A | Discharge status: Home / Self Care | Payer: Commercial Managed Care - PPO | Source: Ambulatory Visit | Attending: Oncology | Admitting: Oncology

## 2017-04-27 DIAGNOSIS — Z139 Encounter for screening, unspecified: Secondary | ICD-10-CM

## 2017-05-09 ENCOUNTER — Ambulatory Visit (INDEPENDENT_AMBULATORY_CARE_PROVIDER_SITE_OTHER): Payer: Commercial Managed Care - PPO | Admitting: Family Medicine

## 2017-05-09 ENCOUNTER — Other Ambulatory Visit: Payer: Self-pay

## 2017-05-09 ENCOUNTER — Encounter: Payer: Self-pay | Admitting: Family Medicine

## 2017-05-09 VITALS — BP 112/70 | HR 60 | Temp 98.2°F | Ht 64.0 in | Wt 150.0 lb

## 2017-05-09 DIAGNOSIS — Z13220 Encounter for screening for lipoid disorders: Secondary | ICD-10-CM | POA: Diagnosis not present

## 2017-05-09 DIAGNOSIS — Z0000 Encounter for general adult medical examination without abnormal findings: Secondary | ICD-10-CM

## 2017-05-09 DIAGNOSIS — L409 Psoriasis, unspecified: Secondary | ICD-10-CM | POA: Diagnosis not present

## 2017-05-09 DIAGNOSIS — Z23 Encounter for immunization: Secondary | ICD-10-CM

## 2017-05-09 DIAGNOSIS — E039 Hypothyroidism, unspecified: Secondary | ICD-10-CM | POA: Diagnosis not present

## 2017-05-09 DIAGNOSIS — Z130 Encounter for screening for diseases of the blood and blood-forming organs and certain disorders involving the immune mechanism: Secondary | ICD-10-CM | POA: Diagnosis not present

## 2017-05-09 DIAGNOSIS — R7303 Prediabetes: Secondary | ICD-10-CM

## 2017-05-09 DIAGNOSIS — E038 Other specified hypothyroidism: Secondary | ICD-10-CM

## 2017-05-09 LAB — CBC WITH DIFFERENTIAL/PLATELET
BASOS PCT: 0.9 % (ref 0.0–3.0)
Basophils Absolute: 0.1 10*3/uL (ref 0.0–0.1)
EOS PCT: 1.9 % (ref 0.0–5.0)
Eosinophils Absolute: 0.1 10*3/uL (ref 0.0–0.7)
HCT: 39.9 % (ref 36.0–46.0)
Hemoglobin: 13.6 g/dL (ref 12.0–15.0)
LYMPHS ABS: 1.7 10*3/uL (ref 0.7–4.0)
Lymphocytes Relative: 29.3 % (ref 12.0–46.0)
MCHC: 34 g/dL (ref 30.0–36.0)
MCV: 93 fl (ref 78.0–100.0)
MONO ABS: 0.5 10*3/uL (ref 0.1–1.0)
MONOS PCT: 8.2 % (ref 3.0–12.0)
NEUTROS PCT: 59.7 % (ref 43.0–77.0)
Neutro Abs: 3.6 10*3/uL (ref 1.4–7.7)
PLATELETS: 235 10*3/uL (ref 150.0–400.0)
RBC: 4.28 Mil/uL (ref 3.87–5.11)
RDW: 13 % (ref 11.5–15.5)
WBC: 5.9 10*3/uL (ref 4.0–10.5)

## 2017-05-09 LAB — LIPID PANEL
CHOLESTEROL: 235 mg/dL — AB (ref 0–200)
HDL: 55.6 mg/dL (ref >39.00)
LDL CALC: 153 mg/dL — AB (ref 0–99)
NonHDL: 179.29
TRIGLYCERIDES: 130 mg/dL (ref 0.0–149.0)
Total CHOL/HDL Ratio: 4
VLDL: 26 mg/dL (ref 0.0–40.0)

## 2017-05-09 LAB — COMPREHENSIVE METABOLIC PANEL
ALBUMIN: 3.7 g/dL (ref 3.5–5.2)
ALK PHOS: 63 U/L (ref 39–117)
ALT: 25 U/L (ref 0–35)
AST: 19 U/L (ref 0–37)
BILIRUBIN TOTAL: 0.4 mg/dL (ref 0.2–1.2)
BUN: 16 mg/dL (ref 6–23)
CALCIUM: 9.2 mg/dL (ref 8.4–10.5)
CHLORIDE: 104 meq/L (ref 96–112)
CO2: 28 mEq/L (ref 19–32)
CREATININE: 0.72 mg/dL (ref 0.40–1.20)
GFR: 90.03 mL/min (ref >60.00)
Glucose, Bld: 110 mg/dL — ABNORMAL HIGH (ref 70–99)
Potassium: 4.5 mEq/L (ref 3.5–5.1)
SODIUM: 141 meq/L (ref 135–145)
TOTAL PROTEIN: 7.1 g/dL (ref 6.0–8.3)

## 2017-05-09 LAB — HEMOGLOBIN A1C: Hgb A1c MFr Bld: 6.2 % (ref 4.6–6.5)

## 2017-05-09 LAB — TSH: TSH: 2.23 u[IU]/mL (ref 0.35–4.50)

## 2017-05-09 NOTE — Assessment & Plan Note (Addendum)
Offered referral to a different dermatologist though she declined.

## 2017-05-09 NOTE — Assessment & Plan Note (Signed)
We will check with our clinical pharmacist regarding her medication.

## 2017-05-09 NOTE — Patient Instructions (Signed)
Nice to see you. We will contact you with your lab results.  Please work on continued diet changes and exercise.

## 2017-05-09 NOTE — Assessment & Plan Note (Signed)
Physical exam completed.  Encouraged continued diet and exercise.  Pelvic exam deferred given Pap smear up-to-date.  Breast exam deferred to her oncologist.  Mammogram up-to-date.  Colonoscopy up-to-date.  Shingrix will be given today.  Lab work as outlined below.

## 2017-05-09 NOTE — Progress Notes (Signed)
Tommi Rumps, MD Phone: [phone]  Erica Woods is a 52 y.o. female who presents today for cpe.  Walking 2-3 days a week for exercise for 30 minutes. She is eating mostly meats and vegetables.  Occasional fast food.  She has cut down on sweet tea and soda significantly. Pap smear negative for cells and HPV on 05/04/16.  No prior history of abnormal Pap smears.  Postmenopausal starting 2 years ago. Mammogram up-to-date and negative.  She follows with oncology for her history of breast cancer and has breast exams through them. Colonoscopy up-to-date 10/23/14.  10-year recall. Tetanus vaccination and flu vaccination up-to-date.  Due for Shingrix. No tobacco use, alcohol use, or illicit drug use. Does report a slight toothache starting over the weekend.  She is going to contact her dentist for this today. She does have psoriasis and was previously following with dermatology though they only want to do topical therapy. Also wonders about the thyroid medicine that she got from the pharmacy and whether or not this is the correct one.  She is supposed to be on Armour Thyroid though it says NP thyroid on her bottle.  Active Ambulatory Problems    Diagnosis Date Noted  . 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 Migraines 11/25/2014  . GERD (gastroesophageal reflux disease) 11/25/2014  . Routine general medical examination at a health care facility 05/02/2015  . 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 Arthralgia of both hands 05/04/2016  . Subclinical hypothyroidism 11/08/2016  . Muscle cramps 11/08/2016  . Plantar fasciitis, left 07/05/2016  . Psoriasis (a type of skin inflammation) 07/05/2016   Resolved Ambulatory Problems    Diagnosis Date Noted  . Neoplasm of breast, 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/2014  . Sinusitis, acute frontal 01/28/2016  . Dysuria 10/08/2016   Past Medical History:    Diagnosis Date  . Allergic rhinitis   . Breast cancer ([place])   . Chickenpox   . GERD (gastroesophageal reflux disease)   . Headache   . Hemorrhoids   . History of blood transfusion   . Hypercholesteremia   . Hyperthyroidism     Family History  Problem Relation Age of Onset  . Heart attack Father   . Hypertension Unknown   . Diabetes Unknown     Social History   Socioeconomic History  . Marital status: Married    Spouse name: Not on file  . Number of children: Not on file  . Years of education: Not on file  . Highest education level: Not on file  Occupational History  . Not on file  Social Needs  . Financial resource strain: Not on file  . Food insecurity:    Worry: Not on file    Inability: Not on file  . Transportation needs:    Medical: Not on file    Non-medical: Not on file  Tobacco Use  . Smoking status: Never Smoker  . Smokeless tobacco: Never Used  Substance and Sexual Activity  . Alcohol use: No  . Drug use: No  . Sexual activity: Not on file  Lifestyle  . Physical activity:    Days per week: Not on file    Minutes per session: Not on file  . Stress: Not on file  Relationships  . Social connections:    Talks on phone: Not on file    Gets together: Not on file    Attends religious service: Not  on file    Active member of club or organization: Not on file    Attends meetings of clubs or organizations: Not on file    Relationship status: Not on file  . Intimate partner violence:    Fear of current or ex partner: Not on file    Emotionally abused: Not on file    Physically abused: Not on file    Forced sexual activity: Not on file  Other Topics Concern  . Not on file  Social History Narrative  . Not on file    ROS  General:  Negative for nexplained weight loss, fever Skin: Negative for new or changing mole, sore that won't heal HEENT: Negative for trouble hearing, trouble seeing, ringing in ears, mouth sores, hoarseness, change in voice,  dysphagia. CV:  Negative for chest pain, dyspnea, edema, palpitations Resp: Negative for cough, dyspnea, hemoptysis GI: Negative for nausea, vomiting, diarrhea, constipation, abdominal pain, melena, hematochezia. GU: Negative for dysuria, incontinence, urinary hesitance, hematuria, vaginal or penile discharge, polyuria, sexual difficulty, lumps in testicle or breasts MSK: Negative for muscle cramps or aches, joint pain or swelling Neuro: Negative for headaches, weakness, numbness, dizziness, passing out/fainting Psych: Negative for depression, anxiety, memory problems  Objective  Physical Exam Vitals:   05/09/17 0801  BP: 112/70  Pulse: 60  Temp: 98.2 F (36.8 C)  SpO2: 97%    BP Readings from Last 3 Encounters:  05/09/17 112/70  03/14/17 120/74  02/04/17 109/62   Wt Readings from Last 3 Encounters:  05/09/17 150 lb (68 kg)  03/14/17 152 lb (68.9 kg)  02/04/17 151 lb (68.5 kg)    Physical Exam  Constitutional: No distress.  HENT:  Head: Normocephalic and atraumatic.  Mouth/Throat: Oropharynx is clear and moist. No oropharyngeal exudate.  No obvious dental abnormalities on exam  Eyes: Pupils are equal, round, and reactive to light. Conjunctivae are normal.  Neck: Neck supple.  Cardiovascular: Normal rate, regular rhythm and normal heart sounds.  Pulmonary/Chest: Effort normal and breath sounds normal.  Abdominal: Soft. Bowel sounds are normal. She exhibits no distension. There is no tenderness. There is no rebound and no guarding.  Musculoskeletal: She exhibits no edema.  Lymphadenopathy:    She has no cervical adenopathy.  Neurological: She is alert. Gait normal.  Skin: Skin is warm and dry. She is not diaphoretic.  Psychiatric: Mood and affect normal.  Scattered psoriatic plaques on her hands bilaterally   Assessment/Plan:   Routine general medical examination at a health care facility Physical exam completed.  Encouraged continued diet and exercise.  Pelvic  exam deferred given Pap smear up-to-date.  Breast exam deferred to her oncologist.  Mammogram up-to-date.  Colonoscopy up-to-date.  Shingrix will be given today.  Lab work as outlined below.  Psoriasis (a type of skin inflammation) Offered referral to a different dermatologist though she declined.  Subclinical hypothyroidism We will check with our clinical pharmacist regarding her medication.  She will contact her dentist regarding her dental pain.  Orders Placed This Encounter  Procedures  . Varicella-zoster vaccine IM (Shingrix)  . Lipid panel  . Comp Met (CMET)  . HgB A1c  . TSH  . CBC w/Diff    No orders of the defined types were placed in this encounter.    Tommi Rumps, MD [place]

## 2017-05-10 NOTE — Progress Notes (Signed)
Patient notified

## 2017-05-18 ENCOUNTER — Telehealth: Payer: Self-pay | Admitting: Family Medicine

## 2017-05-18 NOTE — Telephone Encounter (Signed)
Patient notified form is ready at front desk

## 2017-05-18 NOTE — Telephone Encounter (Signed)
Signed.

## 2017-05-18 NOTE — Telephone Encounter (Signed)
Pt dropped off cpe work form to be filled out. Placed in Dr. Tharon Aquas color folder upfront. Please advise pt when completed

## 2017-05-18 NOTE — Telephone Encounter (Signed)
On your desk to sign, patient has physical on 05/09/2017

## 2017-05-23 ENCOUNTER — Other Ambulatory Visit: Payer: Self-pay | Admitting: Family Medicine

## 2017-07-12 ENCOUNTER — Ambulatory Visit (INDEPENDENT_AMBULATORY_CARE_PROVIDER_SITE_OTHER): Payer: Commercial Managed Care - PPO | Admitting: *Deleted

## 2017-07-12 DIAGNOSIS — Z23 Encounter for immunization: Secondary | ICD-10-CM

## 2017-07-29 ENCOUNTER — Encounter: Payer: Self-pay | Admitting: Family Medicine

## 2017-07-29 ENCOUNTER — Ambulatory Visit: Payer: Commercial Managed Care - PPO | Admitting: Family Medicine

## 2017-07-29 DIAGNOSIS — E039 Hypothyroidism, unspecified: Secondary | ICD-10-CM | POA: Diagnosis not present

## 2017-07-29 DIAGNOSIS — R232 Flushing: Secondary | ICD-10-CM

## 2017-07-29 DIAGNOSIS — K219 Gastro-esophageal reflux disease without esophagitis: Secondary | ICD-10-CM

## 2017-07-29 DIAGNOSIS — E038 Other specified hypothyroidism: Secondary | ICD-10-CM

## 2017-07-29 DIAGNOSIS — E785 Hyperlipidemia, unspecified: Secondary | ICD-10-CM | POA: Insufficient documentation

## 2017-07-29 NOTE — Assessment & Plan Note (Signed)
Continue her current medication.  Most recent TSH acceptable.  Plan to recheck at next visit in September.

## 2017-07-29 NOTE — Assessment & Plan Note (Signed)
She will continue to work on diet and exercise.  Plan to recheck at next visit.

## 2017-07-29 NOTE — Progress Notes (Signed)
  Tommi Rumps, MD Phone: [phone]  Erica Woods is a 52 y.o. female who presents today for f/u.  CC: gerd, hypothyroidism, hot flashes, hld  HYPOTHYROIDISM Disease Monitoring Weight changes: up slightly  Skin Changes: no Heat/Cold intolerance: no  Medication Monitoring Compliance:  Taking NP thyroid   Last TSH:   Lab Results  Component Value Date   TSH 2.23 05/09/2017   GERD:   Reflux symptoms: 1x/week with burning and sour taste, takes an antacid   Abd pain: no   Blood in stool: no  Dysphagia: no   EGD: 01/2017 - states due for repeat after follow-up In July Medication: omeprazole  Hot flashes: Patient notes these have improved quite a bit.  She came off gabapentin about 2 months ago and has rare hot flashes.  He with the tamoxifen.  Hyperlipidemia: She has been exercising by walking 4 days a week 30 to 45 minutes.  She is cut down on fried fatty foods and is also cut down on sweet tea significantly.  Eating mostly vegetables and meats.  Drinking mostly water.   Social History   Tobacco Use  Smoking Status Never Smoker  Smokeless Tobacco Never Used     ROS see history of present illness  Objective  Physical Exam Vitals:   07/29/17 0755  BP: 110/64  Pulse: 66  Temp: (!) 97.4 F (36.3 C)  SpO2: 97%    BP Readings from Last 3 Encounters:  07/29/17 110/64  05/09/17 112/70  03/14/17 120/74   Wt Readings from Last 3 Encounters:  07/29/17 155 lb 12.8 oz (70.7 kg)  05/09/17 150 lb (68 kg)  03/14/17 152 lb (68.9 kg)    Physical Exam  Constitutional: No distress.  Neck: No thyromegaly present.  Cardiovascular: Normal rate, regular rhythm and normal heart sounds.  Pulmonary/Chest: Effort normal and breath sounds normal.  Abdominal: Soft. Bowel sounds are normal. She exhibits no distension. There is no tenderness.  Musculoskeletal: She exhibits no edema.  Neurological: She is alert.  Skin: Skin is warm and dry. She is not diaphoretic.      Assessment/Plan: Please see individual problem list.  Subclinical hypothyroidism Continue her current medication.  Most recent TSH acceptable.  Plan to recheck at next visit in September.  GERD (gastroesophageal reflux disease) Pretty well controlled.  She has follow-up with GI coming up.  She will keep that.  Continue current medication.  Hot flashes Possibly related to menopause versus her tamoxifen.  She has not been having them very frequently recently.  She will continue to monitor.  If needed she can start back on gabapentin.  Hyperlipidemia She will continue to work on diet and exercise.  Plan to recheck at next visit.  No orders of the defined types were placed in this encounter.   No orders of the defined types were placed in this encounter.    Tommi Rumps, MD [place]

## 2017-07-29 NOTE — Assessment & Plan Note (Signed)
Possibly related to menopause versus her tamoxifen.  She has not been having them very frequently recently.  She will continue to monitor.  If needed she can start back on gabapentin.

## 2017-07-29 NOTE — Assessment & Plan Note (Signed)
Pretty well controlled.  She has follow-up with GI coming up.  She will keep that.  Continue current medication.

## 2017-07-29 NOTE — Patient Instructions (Signed)
Nice to see you. Please continue to work on diet and exercise. If your hot flashes return you could start back on the gabapentin. Please follow-up with GI as planned.

## 2017-09-12 ENCOUNTER — Encounter: Payer: Self-pay | Admitting: Gastroenterology

## 2017-09-12 ENCOUNTER — Ambulatory Visit (INDEPENDENT_AMBULATORY_CARE_PROVIDER_SITE_OTHER): Payer: Commercial Managed Care - PPO | Admitting: Gastroenterology

## 2017-09-12 VITALS — BP 104/63 | HR 66 | Ht 64.0 in | Wt 151.4 lb

## 2017-09-12 DIAGNOSIS — K200 Eosinophilic esophagitis: Secondary | ICD-10-CM | POA: Diagnosis not present

## 2017-09-12 NOTE — Progress Notes (Signed)
Erica Bellows MD, MRCP(U.K) [address]  [place]  [place], [place] [address]  Main: [phone]  Fax: [phone]   Primary Care Physician: Erica Haven, MD  Primary Gastroenterologist:  Dr. Jonathon Woods    Summary of history :  She was initially referred and seen in 01/2017 for dysphagia which has been ongoing for 2-3 years and getting worse, solid foods get stuck, heartburn last 6 years. She has had a colonoscopy 2 years- was normal. Next colonoscopy in 10 years- was done at an outside hospital .  02/04/17: EGD A non-obstructing and mild Schatzki ring (acquired) was found at the gastroesophageal junction. A TTS dilator was passed through the scope. Dilation with a 15-16.5-18 mm balloon dilator was performed to 18 mm.The biopsies from EGD are suggestive of eosinophilic esophagitis 15 eosinophils phpf.  Interval history 03/14/2016 -09/12/17  Doing well except has symptoms when drinks milk . Able to eat meat, pork goes down well.   Not on any medications except omeprazole.   Did see ENT and was told they found no foods she had allergy to.   Chief Complaint  Patient presents with  . Follow-up    dysphagia    HPI: Erica Woods is a 52 y.o. female  Current Outpatient Medications  Medication Sig Dispense Refill  . aspirin-acetaminophen-caffeine (EXCEDRIN MIGRAINE) 250-250-65 MG tablet Take 1 tablet by mouth every 6 (six) hours as needed for headache. 30 tablet 0  . gabapentin (NEURONTIN) 100 MG capsule Take 1-2 capsules (100-200 mg total) by mouth at bedtime. 120 capsule 6  . NP THYROID 30 MG tablet TAKE 1 & 1/2 (ONE & ONE-HALF) TABLETS BY MOUTH ONCE DAILY BEFORE BREAKFAST 45 tablet 3  . omeprazole (PRILOSEC) 40 MG capsule Take 1 capsule (40 mg total) by mouth daily. 90 capsule 3  . tamoxifen (NOLVADEX) 20 MG tablet Take 1 tablet (20 mg total) by mouth daily. 90 tablet 3.   No current facility-administered medications for this visit.     Allergies as of  09/12/2017 - Review Complete 09/12/2017  Allergen Reaction Noted  . Gold-containing drug products  03/14/2017    ROS:  General: Negative for anorexia, weight loss, fever, chills, fatigue, weakness. ENT: Negative for hoarseness, difficulty swallowing , nasal congestion. CV: Negative for chest pain, angina, palpitations, dyspnea on exertion, peripheral edema.  Respiratory: Negative for dyspnea at rest, dyspnea on exertion, cough, sputum, wheezing.  GI: See history of present illness. GU:  Negative for dysuria, hematuria, urinary incontinence, urinary frequency, nocturnal urination.  Endo: Negative for unusual weight change.    Physical Examination:   BP 104/63   Pulse 66   Ht 5\' 4"  (1.626 m)   Wt 151 lb 6.4 oz (68.7 kg)   LMP 10/09/2014   BMI 25.99 kg/m   General: Well-nourished, well-developed in no acute distress.  Eyes: No icterus. Conjunctivae pink. Mouth: Oropharyngeal mucosa moist and pink , no lesions erythema or exudate. Lungs: Clear to auscultation bilaterally. Non-labored. Heart: Regular rate and rhythm, no murmurs rubs or gallops.  Abdomen: Bowel sounds are normal, nontender, nondistended, no hepatosplenomegaly or masses, no abdominal bruits or hernia , no rebound or guarding.   Extremities: No lower extremity edema. No clubbing or deformities. Neuro: Alert and oriented x 3.  Grossly intact. Skin: Warm and dry, no jaundice.   Psych: Alert and cooperative, normal mood and affect.   Imaging Studies: No results found.  Assessment and Plan:   Erica Woods is a 52 y.o. 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 . H/o GERD. Dysphagia has resolved since commence on PPI and dilation of mild stricture. Since the dilation  not having symptoms will not commence on Fluticasone, she seems to  be responding to the PPI. She is not keen presently to repeat EGD to check for resolution of esophagal eosinophilia. We will see how she does in 6  months time.     [name]  MD,MRCP [facility])

## 2017-09-21 ENCOUNTER — Other Ambulatory Visit: Payer: Self-pay | Admitting: Family Medicine

## 2017-11-14 ENCOUNTER — Ambulatory Visit: Payer: Commercial Managed Care - PPO | Admitting: Family Medicine

## 2017-11-14 ENCOUNTER — Encounter: Payer: Self-pay | Admitting: Family Medicine

## 2017-11-14 VITALS — BP 106/80 | HR 60 | Temp 98.4°F | Ht 64.0 in | Wt 152.6 lb

## 2017-11-14 DIAGNOSIS — E039 Hypothyroidism, unspecified: Secondary | ICD-10-CM | POA: Diagnosis not present

## 2017-11-14 DIAGNOSIS — D0502 Lobular carcinoma in situ of left breast: Secondary | ICD-10-CM | POA: Diagnosis not present

## 2017-11-14 DIAGNOSIS — F439 Reaction to severe stress, unspecified: Secondary | ICD-10-CM | POA: Diagnosis not present

## 2017-11-14 DIAGNOSIS — K219 Gastro-esophageal reflux disease without esophagitis: Secondary | ICD-10-CM

## 2017-11-14 DIAGNOSIS — Z23 Encounter for immunization: Secondary | ICD-10-CM

## 2017-11-14 LAB — TSH: TSH: 1.81 u[IU]/mL (ref 0.35–4.50)

## 2017-11-14 NOTE — Progress Notes (Signed)
  Tommi Rumps, MD Phone: [phone]  Erica Woods is a 52 y.o. female who presents today for f/u.  CC: GERD, hypothyroidism, history of breast cancer  GERD:   Reflux symptoms: no   Abd pain: no   Blood in stool: no  Dysphagia: no   EGD: 35/00/93 - eosinophilic esophagitis, following with GI  Medication: omeprazole  HYPOTHYROIDISM Disease Monitoring Weight changes: no  Skin Changes: no Heat/Cold intolerance: no  Medication Monitoring Compliance:  Taking nature thyroid Last TSH:   Lab Results  Component Value Date   TSH 2.23 05/09/2017   History of Breast cancer: Doing well.  No new lesions.  Continues on tamoxifen.  Following with oncology.  Sees them next month.  Stress: She does note some stress at home with having to care for her mother-in-law and her husband is about to have surgery.  Also stressed with her job.  No depression.  No anxiety.  She is snapping at people a little quicker.  Social History   Tobacco Use  Smoking Status Never Smoker  Smokeless Tobacco Never Used     ROS see history of present illness  Objective  Physical Exam Vitals:   11/14/17 0829  BP: 106/80  Pulse: 60  Temp: 98.4 F (36.9 C)  SpO2: 98%    BP Readings from Last 3 Encounters:  11/14/17 106/80  09/12/17 104/63  07/29/17 110/64   Wt Readings from Last 3 Encounters:  11/14/17 152 lb 9.6 oz (69.2 kg)  09/12/17 151 lb 6.4 oz (68.7 kg)  07/29/17 155 lb 12.8 oz (70.7 kg)    Physical Exam  Constitutional: No distress.  Cardiovascular: Normal rate, regular rhythm and normal heart sounds.  Pulmonary/Chest: Effort normal and breath sounds normal.  Musculoskeletal: She exhibits no edema.  Neurological: She is alert.  Skin: Skin is warm and dry. She is not diaphoretic.     Assessment/Plan: Please see individual problem list.  Hypothyroidism Asymptomatic.  We will check a TSH.  Continue current regimen.  GERD (gastroesophageal reflux disease) Asymptomatic.   Continue current regimen.  Continue to follow with GI.  Lobular carcinoma in situ (LCIS) of left breast Reports doing well.  She has an upcoming with her oncologist.  She will keep that.  Stress Work and home stressors.  No depression or anxiety.  I offered support.   Orders Placed This Encounter  Procedures  . Flu Vaccine QUAD 6+ mos PF IM (Fluarix Quad PF)  . TSH    No orders of the defined types were placed in this encounter.    Tommi Rumps, MD [place]

## 2017-11-14 NOTE — Assessment & Plan Note (Addendum)
Work and home stressors.  No depression or anxiety.  I offered support.

## 2017-11-14 NOTE — Assessment & Plan Note (Signed)
Reports doing well.  She has an upcoming with her oncologist.  She will keep that.

## 2017-11-14 NOTE — Assessment & Plan Note (Signed)
Asymptomatic.  We will check a TSH.  Continue current regimen.

## 2017-11-14 NOTE — Assessment & Plan Note (Signed)
Asymptomatic.  Continue current regimen.  Continue to follow with GI.

## 2017-11-14 NOTE — Patient Instructions (Signed)
Nice to see you. We will check your thyroid function today.  Please continue your current medication.

## 2017-11-25 ENCOUNTER — Other Ambulatory Visit: Payer: Self-pay | Admitting: *Deleted

## 2017-11-25 DIAGNOSIS — N6092 Unspecified benign mammary dysplasia of left breast: Secondary | ICD-10-CM

## 2017-11-25 DIAGNOSIS — D0502 Lobular carcinoma in situ of left breast: Secondary | ICD-10-CM

## 2017-11-27 NOTE — Progress Notes (Signed)
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 This showed in the left breast some suspicious calcifications, and on 08/27/2014 the patient underwent digital left diagnostic mammography. Breast density was category C. The study showed 2 adjacent groups of pleomorphic calcifications one measuring 8 mm at the 12:30 o'clock position and another one 0.4 mm in the central left breast. The distance between these groups was 4.5 cm. There were no other findings of concern.  On 09/03/2014 Erica Woods underwent biopsy of the 2 left breast areas in question. The larger area at 12:30 o'clock showed atypical ductal hyperplasia as did the separate more central left breast lesion ([id number]).  The patient was then referred to Dr. [name] and after appropriate discussion he proceeded to needle localization excisional biopsies 2. The result of this "double lumpectomy" on 09/03/2014 showed ([place] [phone]) at the larger lesion, at 12:30 o'clock, marked with an X shaped clip, a benign complex sclerosing lesion/radial scar associated with lobular carcinoma in situ. In the more central, smaller lesion, marked with a cylinder shaped clip, there was again atypical lobular hyperplasia and lobular carcinoma in situ..  Because of these concerns, on 10/18/2014 the patient underwent bilateral breast MRIs. The right breast showed scattered benign appearing  enhancing foci. In the left breast there was a seroma involving both the upper outer and lower outer quadrant measuring up to 5.4 cm. There were no other areas of concern and no abnormal adenopathy or other ancillary findings. Because of both the right breast and left breast findings a repeat MRI of the breast in 6 months was recommended.  The patient's subsequent history is as detailed below  INTERVAL HISTORY: Erica Woods returns today for follow-up of her lobular carcinoma in situ. She is accompanied by her husband today. She is on prophylactic tamoxifen with good tolerance. She has minimal hot flashes and no increase in vaginal wetness. She notes that her hot flashes are alleviated with Rx gabapentin. She pays about $10 for a Tamoxifen prescription.   Since her last visit to the office, she underwent a bilateral screening mammogram on 04/27/2017 showed: Breast density category B. No mammographic evidence of malignancy.   She also had an upper endoscopy on 02/04/2017 showed: Esophagus; random; cold biopsy: squamous mucosa with increase in intraepithelial eosinophils (up to 15 eosinophils in a high-power field). Negative for dysplasia and malignancy.    REVIEW OF SYSTEMS: Erica Woods reports that she is not currently exercising. She is still working and she is helping to take care of a family member. She denies unusual headaches, visual changes, nausea, vomiting, or dizziness. There has been no unusual cough, phlegm production, or pleurisy. This been no change in bowel or bladder habits. She denies unexplained fatigue or unexplained weight loss, bleeding, rash, or fever. A detailed review of systems was otherwise stable.     Past Medical History:  Diagnosis Date  . Allergic rhinitis   . Breast cancer ([place])   . Chickenpox   . GERD (gastroesophageal  reflux disease)   . Headache   . Hemorrhoids   . History of blood transfusion   . Hypercholesteremia   . Hyperthyroidism     PAST SURGICAL HISTORY: Past  Surgical History:  Procedure Laterality Date  . ABDOMINAL SURGERY    . APPENDECTOMY    . BALLOON DILATION N/A 02/04/2017   Procedure: BALLOON DILATION;  Surgeon: [name], MD;  Location: [facility] ENDOSCOPY;  Service: Gastroenterology;  Laterality: N/A;  . BREAST BIOPSY Left 10/02/2014   Procedure: LEFT BREAST BIOPSY AFTER NEEDLE LOCALIZATION X TWO;  Surgeon: [name], MD;  Location: [facility] ORS;  Service: General;  Laterality: Left;  . BREAST EXCISIONAL BIOPSY Left 2016  . BREAST LUMPECTOMY  2016   benign  . COLONOSCOPY N/A 10/23/2014   Procedure: COLONOSCOPY;  Surgeon: Rogene Houston, MD;  Location: [facility] ENDO SUITE;  Service: Endoscopy;  Laterality: N/A;  730  . ESOPHAGOGASTRODUODENOSCOPY (EGD) WITH PROPOFOL N/A 02/04/2017   Procedure: ESOPHAGOGASTRODUODENOSCOPY (EGD) WITH PROPOFOL;  Surgeon: [name], MD;  Location: [facility] ENDOSCOPY;  Service: Gastroenterology;  Laterality: N/A;  . LAPAROSCOPIC APPENDECTOMY  03/02/2012   Procedure: APPENDECTOMY LAPAROSCOPIC;  Surgeon: [name], MD;  Location: [facility] ORS;  Service: General;  Laterality: N/A;  . OVARIAN CYST REMOVAL Left   . TUBAL LIGATION    . WISDOM TOOTH EXTRACTION Bilateral     FAMILY HISTORY Family History  Problem Relation Age of Onset  . Heart attack Father   . Hypertension Unknown   . Diabetes Unknown   the patient's father died from a heart attack at age 78. The patient's mother is living, at age 41. The patient had 1 full brother and one full sister, then 2 half sisters. The full sister was diagnosed with either cancer of the uterus or perhaps more likely cancer of the cervix in her 13s (the patient tells me that her sister had 2 babies after being diagnosed, so uterine cancer appears unlikely). There is no other history of breast or ovarian cancer in the family and no history of colon cancer in the family  GYNECOLOGIC HISTORY:  Patient's last menstrual period was 10/09/2014. Menarche age 80, first live birth age 26. She is GX  P2. She is still having regular periods, most recently late August. ("This one is a little late". 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 Currently she is doing third shift 8 hours 5 days a week. She also sits sometimes for private clients. Her husband Erica Woods worked for the Technical sales engineer chiefly on [place]. Son Erica Woods in [name] is a Metallurgist, doing body work, and son Erica Woods in [place] is a Health and safety inspector. The patient has no grandchildren. She attends a local [employer]: not in place; the patient is aware that her husband would be her healthcare power of attorney by default in casesomething happened to her and she is comfortable with that   HEALTH MAINTENANCE: Social History   Tobacco Use  . Smoking status: Never Smoker  . Smokeless tobacco: Never Used  Substance Use Topics  . Alcohol use: No  . Drug use: No     Colonoscopy: 10/23/2014; unremarkable  PAP:06/21/2014  Bone density:never  Lipid panel:  Allergies  Allergen Reactions  . Gold-Containing Drug Products     Allergic to GOLD     Current Outpatient Medications  Medication Sig Dispense Refill  .  aspirin-acetaminophen-caffeine (EXCEDRIN MIGRAINE) 250-250-65 MG tablet Take 1 tablet by mouth every 6 (six) hours as needed for headache. 30 tablet 0  . gabapentin (NEURONTIN) 100 MG capsule Take 1-2 capsules (100-200 mg total) by mouth at bedtime. 120 capsule 6  . NP THYROID 30 MG tablet TAKE 1 & 1/2 (ONE & ONE-HALF) TABLETS BY MOUTH ONCE DAILY BEFORE  BREAKFAST 45 tablet 3  . omeprazole (PRILOSEC) 40 MG capsule Take 1 capsule (40 mg total) by mouth daily. 90 capsule 3  . tamoxifen (NOLVADEX) 20 MG tablet Take 1 tablet (20 mg total) by mouth daily. 90 tablet 3.   No current facility-administered medications for this visit.      OBJECTIVE: middle-aged white woman who appears well Vitals:   11/28/17 0834  BP: 115/62  Pulse: 66  Resp: 18  Temp: 98 F (36.7 C)  SpO2: 100%     Body mass index is 26.43 kg/m.    ECOG FS:0 - Asymptomatic   Sclerae unicteric, EOMs intact Oropharynx clear and moist No cervical or supraclavicular adenopathy Lungs no rales or rhonchi Heart regular rate and rhythm Abd soft, nontender, positive bowel sounds MSK no focal spinal tenderness, no upper extremity lymphedema Neuro: nonfocal, well oriented, appropriate affect Breasts: The right breast is unremarkable.  The left breast has undergone lumpectomy.  There are no suspicious findings.  Both axillae are benign.  LAB RESULTS:  CMP     Component Value Date/Time   NA 141 05/09/2017 0824   NA 141 11/22/2016 1228   K 4.5 05/09/2017 0824   K 4.2 11/22/2016 1228   CL 104 05/09/2017 0824   CO2 28 05/09/2017 0824   CO2 28 11/22/2016 1228   GLUCOSE 110 (H) 05/09/2017 0824   GLUCOSE 97 11/22/2016 1228   BUN 16 05/09/2017 0824   BUN 9.1 11/22/2016 1228   CREATININE 0.72 05/09/2017 0824   CREATININE 0.8 11/22/2016 1228   CALCIUM 9.2 05/09/2017 0824   CALCIUM 9.4 11/22/2016 1228   PROT 7.1 05/09/2017 0824   PROT 7.1 11/22/2016 1228   ALBUMIN 3.7 05/09/2017 0824   ALBUMIN 3.5 11/22/2016 1228   AST 19 05/09/2017 0824   AST 23 11/22/2016 1228   ALT 25 05/09/2017 0824   ALT 27 11/22/2016 1228   ALKPHOS 63 05/09/2017 0824   ALKPHOS 69 11/22/2016 1228   BILITOT 0.4 05/09/2017 0824   BILITOT 0.42 11/22/2016 1228   GFRNONAA >60 09/27/2014 0950   GFRAA >60 09/27/2014 0950    INo results found for: SPEP, UPEP  Lab Results  Component Value Date   WBC 5.9 11/28/2017   NEUTROABS 3.2 11/28/2017   HGB 13.1 11/28/2017   HCT 40.6 11/28/2017   MCV 93.5 11/28/2017   PLT 224 11/28/2017      Chemistry      Component Value Date/Time   NA 141 05/09/2017 0824   NA 141 11/22/2016 1228   K 4.5 05/09/2017 0824   K 4.2  11/22/2016 1228   CL 104 05/09/2017 0824   CO2 28 05/09/2017 0824   CO2 28 11/22/2016 1228   BUN 16 05/09/2017 0824   BUN 9.1 11/22/2016 1228   CREATININE 0.72 05/09/2017 0824   CREATININE 0.8 11/22/2016 1228      Component Value Date/Time   CALCIUM 9.2 05/09/2017 0824   CALCIUM 9.4 11/22/2016 1228   ALKPHOS 63 05/09/2017 0824   ALKPHOS 69 11/22/2016 1228   AST 19 05/09/2017 0824   AST 23 11/22/2016 1228   ALT 25 05/09/2017 0824  ALT 27 11/22/2016 1228   BILITOT 0.4 05/09/2017 0824   BILITOT 0.42 11/22/2016 1228       No results found for: LABCA2  No components found for: LABCA125  No results for input(s): INR in the last 168 hours.  Urinalysis    Component Value Date/Time   COLORURINE YELLOW 03/02/2012 1740   APPEARANCEUR CLEAR 03/02/2012 1740   LABSPEC <1.005 (L) 03/02/2012 1740   PHURINE 6.5 03/02/2012 1740   GLUCOSEU NEGATIVE 03/02/2012 1740   HGBUR MODERATE (A) 03/02/2012 1740   BILIRUBINUR neg 10/08/2016 1009   KETONESUR NEGATIVE 03/02/2012 1740   PROTEINUR neg 10/08/2016 1009   PROTEINUR NEGATIVE 03/02/2012 1740   UROBILINOGEN 0.2 10/08/2016 1009   UROBILINOGEN 1.0 03/02/2012 1740   NITRITE neg 10/08/2016 1009   NITRITE NEGATIVE 03/02/2012 1740   LEUKOCYTESUR Negative 10/08/2016 1009    STUDIES: Bilateral screening mammogram on 04/27/2017 showed: Breast density category B. No mammographic evidence of malignancy.   Upper endoscopy on 02/04/2017 showed: Esophagus; random; cold biopsy: squamous mucosa with increase in intraepithelial eosinophils (up to 15 eosinophils in a high-power field). Negative for dysplasia and malignancy.   ASSESSMENT: 52 y.o. [place], [place] woman status post core needle biopsy 2 on 09/03/2014, both areas showing atypical ductal hyperplasia  (1) s/p Left breast excisional biopsy x2 on 10/02/2014 for a complex sclerosing lesion/radial scar associated with atypical lobular hyperplasia and lobular carcinoma is situ, and a separate  area showing also lobular carcinoma in situ  (2) on prophylactic tamoxifen as of March 2017   PLAN:  L[place]ney is now a little over 3 years out from diagnosis of her lobular carcinoma in situ.  She is tolerating tamoxifen well and the plan continue for a total of 5 years, which means she will have 2 more yearly visits with [place].  The gabapentin is helping with her hot flashes.  We reviewed her mammogram which is very favorable.  In particular she does not have dense breasts and this means mammography is more sensitive.  Even though she is no longer counting her steps she is quite busy and feels sure that she is doing more than 6000 steps a day.  She will see me again in a year.  She knows to call for any problems that may develop before the next visit.   Solly Derasmo, Virgie Dad, MD  11/28/17 9:07 AM Medical Oncology and Hematology [facility] [address] [place], [place] [address] Tel. [phone]    Fax. [phone]    I, Soijett Blue am acting as scribe for Dr. Sarajane Jews C. Mekhia Brogan.  I, Lurline Del MD, have reviewed the above documentation for accuracy and completeness, and I agree with the above.

## 2017-11-28 ENCOUNTER — Inpatient Hospital Stay: Payer: Commercial Managed Care - PPO

## 2017-11-28 ENCOUNTER — Telehealth: Payer: Self-pay | Admitting: Oncology

## 2017-11-28 ENCOUNTER — Inpatient Hospital Stay: Payer: Commercial Managed Care - PPO | Attending: Oncology | Admitting: Oncology

## 2017-11-28 VITALS — BP 115/62 | HR 66 | Temp 98.0°F | Resp 18 | Ht 64.0 in | Wt 154.0 lb

## 2017-11-28 DIAGNOSIS — Z79810 Long term (current) use of selective estrogen receptor modulators (SERMs): Secondary | ICD-10-CM

## 2017-11-28 DIAGNOSIS — Z170 Estrogen receptor positive status [ER+]: Secondary | ICD-10-CM | POA: Insufficient documentation

## 2017-11-28 DIAGNOSIS — N6092 Unspecified benign mammary dysplasia of left breast: Secondary | ICD-10-CM

## 2017-11-28 DIAGNOSIS — D0502 Lobular carcinoma in situ of left breast: Secondary | ICD-10-CM | POA: Diagnosis not present

## 2017-11-28 LAB — COMPREHENSIVE METABOLIC PANEL
ALBUMIN: 3.5 g/dL (ref 3.5–5.0)
ALT: 19 U/L (ref 0–44)
AST: 21 U/L (ref 15–41)
Alkaline Phosphatase: 59 U/L (ref 38–126)
Anion gap: 9 (ref 5–15)
BUN: 13 mg/dL (ref 6–20)
CALCIUM: 9.4 mg/dL (ref 8.9–10.3)
CO2: 26 mmol/L (ref 22–32)
Chloride: 109 mmol/L (ref 98–111)
Creatinine, Ser: 0.78 mg/dL (ref 0.44–1.00)
GFR calc Af Amer: >60 mL/min (ref >60)
GLUCOSE: 114 mg/dL — AB (ref 70–99)
POTASSIUM: 4.9 mmol/L (ref 3.5–5.1)
Sodium: 144 mmol/L (ref 135–145)
TOTAL PROTEIN: 7 g/dL (ref 6.5–8.1)
Total Bilirubin: 0.4 mg/dL (ref 0.3–1.2)

## 2017-11-28 LAB — CBC WITH DIFFERENTIAL (CANCER CENTER ONLY)
Abs Immature Granulocytes: 0.01 10*3/uL (ref 0.00–0.07)
BASOS ABS: 0 10*3/uL (ref 0.0–0.1)
BASOS PCT: 1 %
EOS ABS: 0.2 10*3/uL (ref 0.0–0.5)
Eosinophils Relative: 3 %
HCT: 40.6 % (ref 36.0–46.0)
Hemoglobin: 13.1 g/dL (ref 12.0–15.0)
IMMATURE GRANULOCYTES: 0 %
Lymphocytes Relative: 32 %
Lymphs Abs: 1.9 10*3/uL (ref 0.7–4.0)
MCH: 30.2 pg (ref 26.0–34.0)
MCHC: 32.3 g/dL (ref 30.0–36.0)
MCV: 93.5 fL (ref 80.0–100.0)
Monocytes Absolute: 0.6 10*3/uL (ref 0.1–1.0)
Monocytes Relative: 9 %
NEUTROS PCT: 55 %
NRBC: 0 % (ref 0.0–0.2)
Neutro Abs: 3.2 10*3/uL (ref 1.7–7.7)
PLATELETS: 224 10*3/uL (ref 150–400)
RBC: 4.34 MIL/uL (ref 3.87–5.11)
RDW: 12.4 % (ref 11.5–15.5)
WBC Count: 5.9 10*3/uL (ref 4.0–10.5)

## 2017-11-28 MED ORDER — GABAPENTIN 100 MG PO CAPS: 100.0000 mg | ORAL_CAPSULE | Freq: Every day | ORAL | 6 refills | Status: DC

## 2017-11-28 MED ORDER — TAMOXIFEN CITRATE 20 MG PO TABS: 20.0000 mg | ORAL_TABLET | Freq: Every day | ORAL | Status: DC

## 2017-11-28 NOTE — Telephone Encounter (Signed)
Gave avs and calendar ° °

## 2018-01-22 ENCOUNTER — Other Ambulatory Visit: Payer: Self-pay | Admitting: Family Medicine

## 2018-02-07 ENCOUNTER — Encounter: Payer: Self-pay | Admitting: Gastroenterology

## 2018-02-07 ENCOUNTER — Ambulatory Visit: Payer: Commercial Managed Care - PPO | Admitting: Gastroenterology

## 2018-02-07 VITALS — BP 116/72 | HR 66 | Ht 64.0 in | Wt 163.8 lb

## 2018-02-07 DIAGNOSIS — K200 Eosinophilic esophagitis: Secondary | ICD-10-CM | POA: Diagnosis not present

## 2018-02-07 DIAGNOSIS — R1310 Dysphagia, unspecified: Secondary | ICD-10-CM | POA: Diagnosis not present

## 2018-02-07 MED ORDER — OMEPRAZOLE 40 MG PO CPDR: 40.0000 mg | DELAYED_RELEASE_CAPSULE | Freq: Every day | ORAL | 3 refills | Status: DC

## 2018-02-07 NOTE — Progress Notes (Signed)
Erica Woods, MRCP(U.K) [address]  [place]  [place], [place] [address]  Main: [phone]  Fax: [phone]   Primary Care Physician: Erica Woods, Woods  Primary Gastroenterologist:  Dr. Jonathon Woods   Chief Complaint  Patient presents with  . Follow-up    Eosinophilic esophagitis    HPI: Erica Woods is a 52 y.o. female   Summary of history :  She was initially referred and seen in 01/2017 for dysphagia which has been ongoing for 2-3 years and getting worse, solid foods get stuck, heartburn last 6 years.She has had a colonoscopy 2 years- was normal. Next colonoscopy in 10 years- was done at an outside hospital .  02/04/17: EGDA non-obstructing and mild Schatzki ring (acquired) was found at the gastroesophageal junction. A TTS dilator was passed through the scope. Dilation with a 15-16.5-18 mm balloon dilator was performed to 18 mm.Thebiopsies from EGD are suggestive of eosinophilic [id number] eosinophils phpf.  Interval history7/29/19-12/24/19   No issues with swallowing .. 2 % milk doesn't seem to cause any issues. Able to eat meat.  Doing well on regular use of omeprazole.     Current Outpatient Medications  Medication Sig Dispense Refill  . aspirin-acetaminophen-caffeine (EXCEDRIN MIGRAINE) 250-250-65 MG tablet Take 1 tablet by mouth every 6 (six) hours as needed for headache. 30 tablet 0  . gabapentin (NEURONTIN) 100 MG capsule Take 1-2 capsules (100-200 mg total) by mouth at bedtime. 120 capsule 6  . NP THYROID 30 MG tablet TAKE 1 & 1/2 (ONE & ONE-HALF) TABLETS BY MOUTH ONCE DAILY BEFORE BREAKFAST 45 tablet 3  . omeprazole (PRILOSEC) 40 MG capsule Take 1 capsule (40 mg total) by mouth daily. 90 capsule 3  . tamoxifen (NOLVADEX) 20 MG tablet Take 1 tablet (20 mg total) by mouth daily. 90 tablet 3.   No current facility-administered medications for this visit.     Allergies as of 02/07/2018 - Review Complete 02/07/2018  Allergen Reaction  Noted  . Gold-containing drug products  03/14/2017    ROS:  General: Negative for anorexia, weight loss, fever, chills, fatigue, weakness. ENT: Negative for hoarseness, difficulty swallowing , nasal congestion. CV: Negative for chest pain, angina, palpitations, dyspnea on exertion, peripheral edema.  Respiratory: Negative for dyspnea at rest, dyspnea on exertion, cough, sputum, wheezing.  GI: See history of present illness. GU:  Negative for dysuria, hematuria, urinary incontinence, urinary frequency, nocturnal urination.  Endo: Negative for unusual weight change.    Physical Examination:   BP 116/72   Pulse 66   Ht 5\' 4"  (1.626 m)   Wt 163 lb 12.8 oz (74.3 kg)   LMP 10/09/2014   BMI 28.12 kg/m   General: Well-nourished, well-developed in no acute distress.  Eyes: No icterus. Conjunctivae pink. Mouth: Oropharyngeal mucosa moist and pink , no lesions erythema or exudate. Lungs: Clear to auscultation bilaterally. Non-labored. Heart: Regular rate and rhythm, no murmurs rubs or gallops.  Abdomen: Bowel sounds are normal, nontender, nondistended, no hepatosplenomegaly or masses, no abdominal bruits or hernia , no rebound or guarding.   Extremities: No lower extremity edema. No clubbing or deformities. Neuro: Alert and oriented x 3.  Grossly intact. Skin: Warm and dry, no jaundice.   Psych: Alert and cooperative, normal mood and affect.   Imaging Studies: No results found.  Assessment and Plan:   Erica Woods is a 52 y.o. y/o femalehere to follow upfor dysphagiasecondary to eosinophilic esophagitis..H/oGERD. Dysphagia has resolved since commence on PPI and dilation of  mild stricture. Since the dilation  not having symptoms will not commence on Fluticasone,No issues over last 6 months  Dr Erica Woods  Woods,MRCP [facility]) Follow up in 6 months

## 2018-03-20 IMAGING — MG DIGITAL SCREENING BILATERAL MAMMOGRAM WITH TOMO AND CAD
8 series · 8 of 24 positions shown · non-contrast
Comparison: Previous exam(s).

CLINICAL DATA: Screening.

EXAM:
DIGITAL SCREENING BILATERAL MAMMOGRAM WITH TOMO AND CAD

[R CC synth-2D]
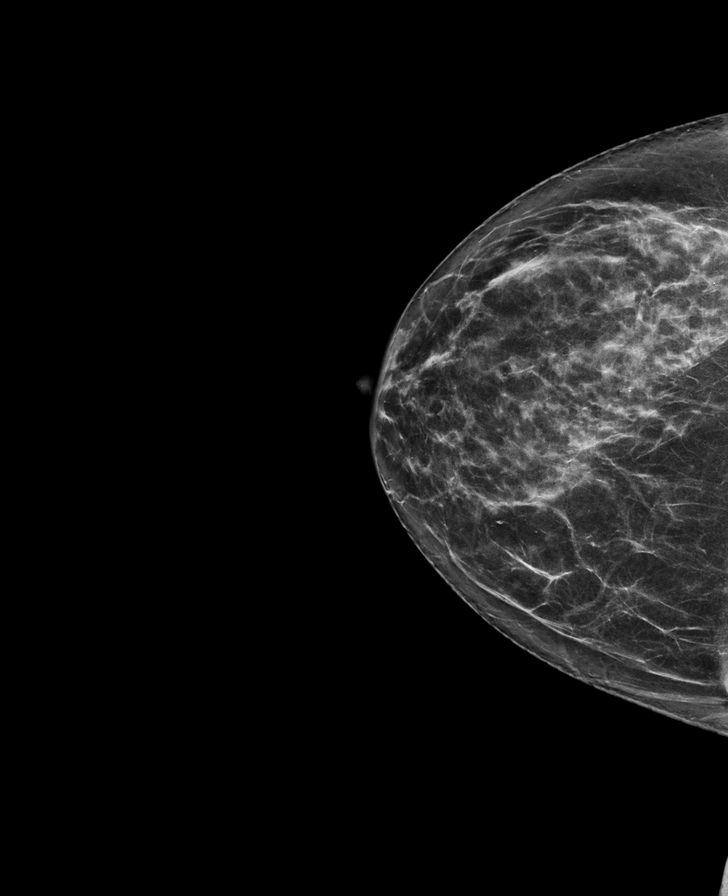

[L CC synth-2D]
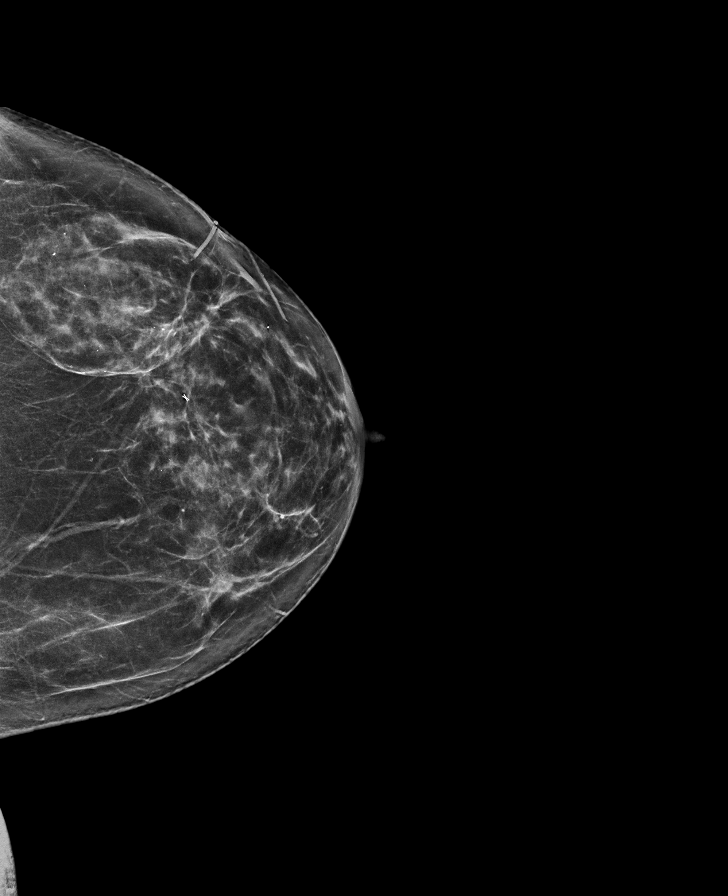

[L MLO synth-2D]
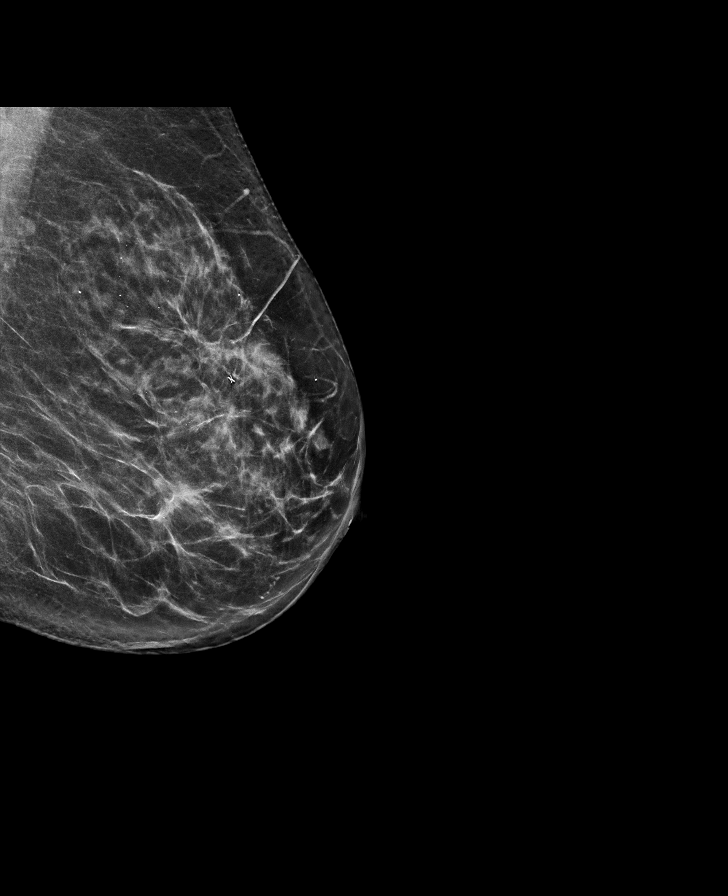

[R MLO synth-2D]
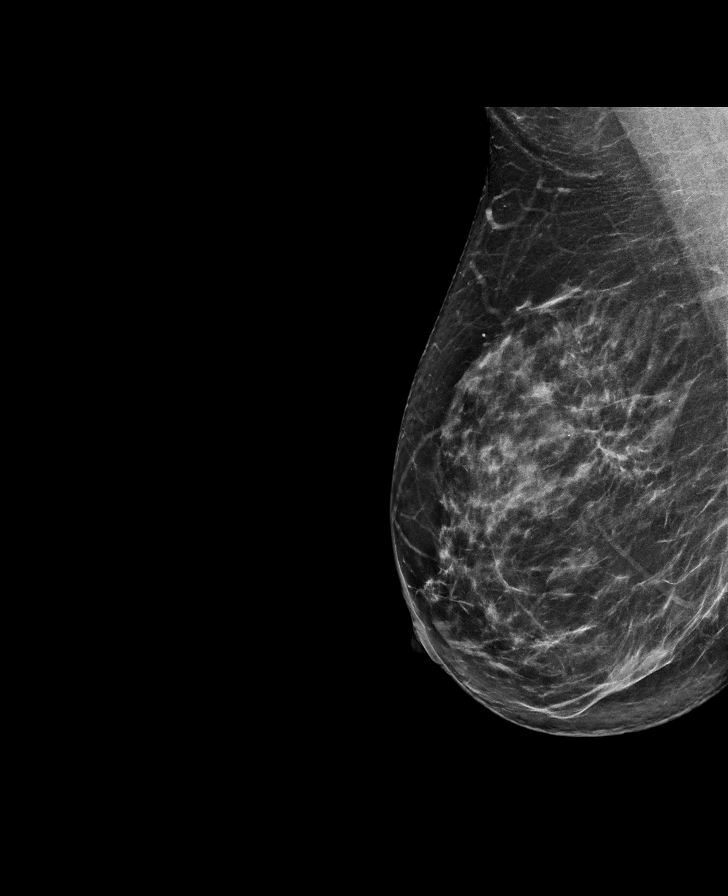

[R MLO tomo · tomo slice 41/80.0]
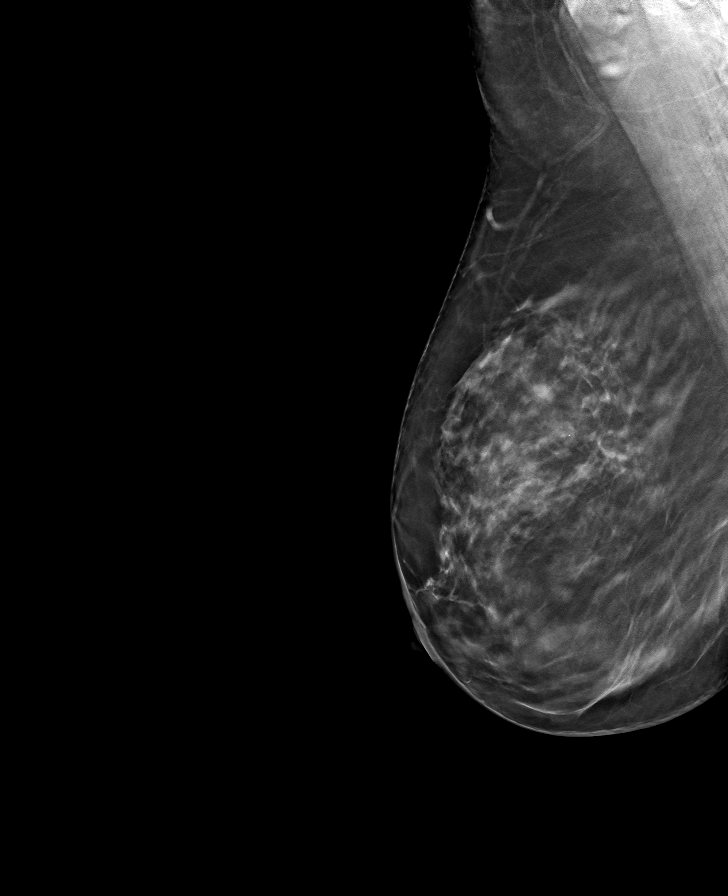

[L CC tomo · tomo slice 37/72.0]
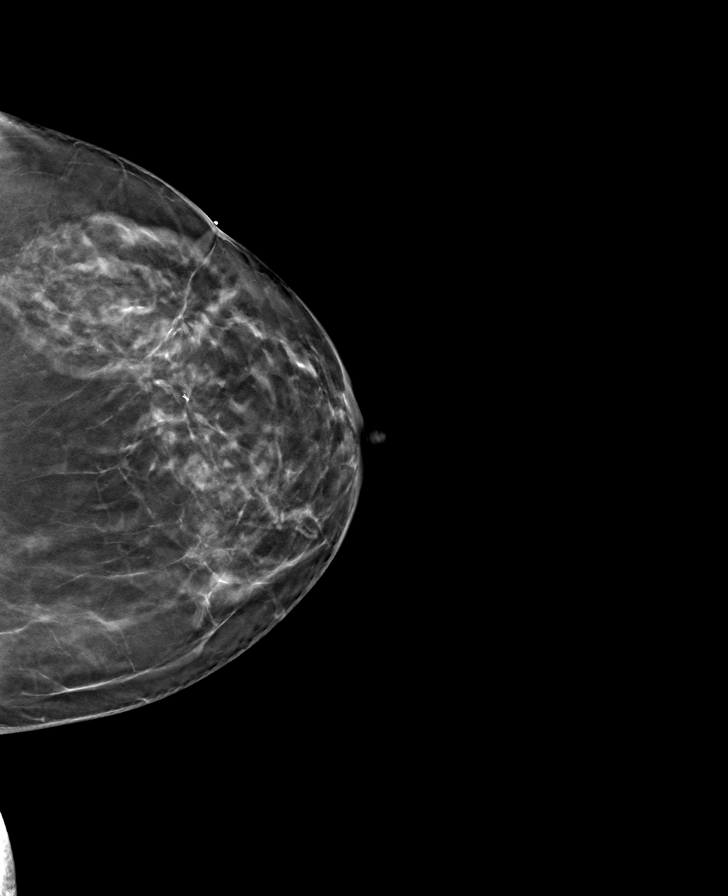

[L MLO tomo · tomo slice 39/77.0]
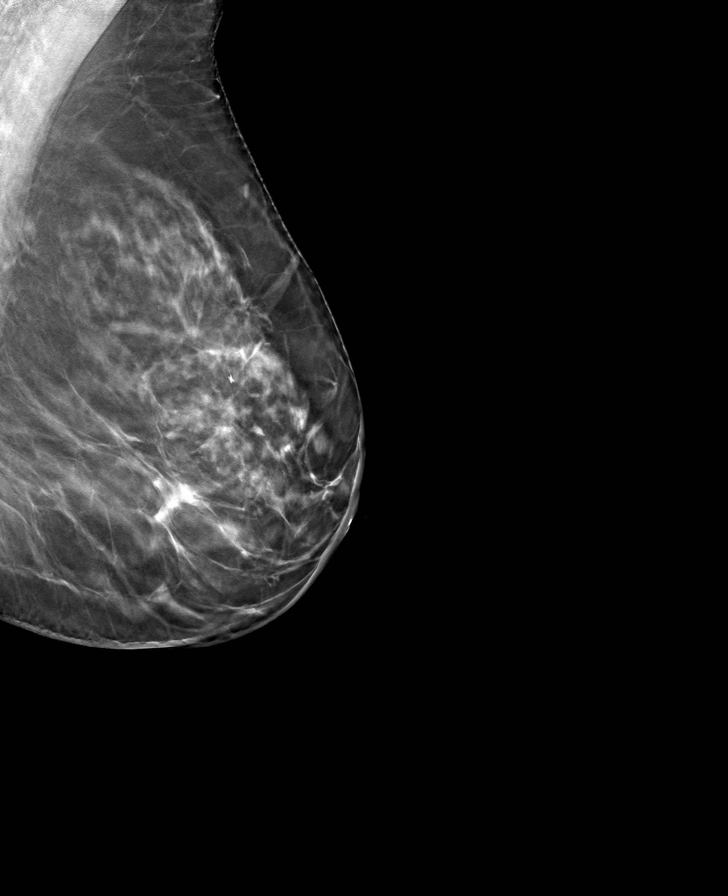

[R CC tomo · tomo slice 35/69.0]
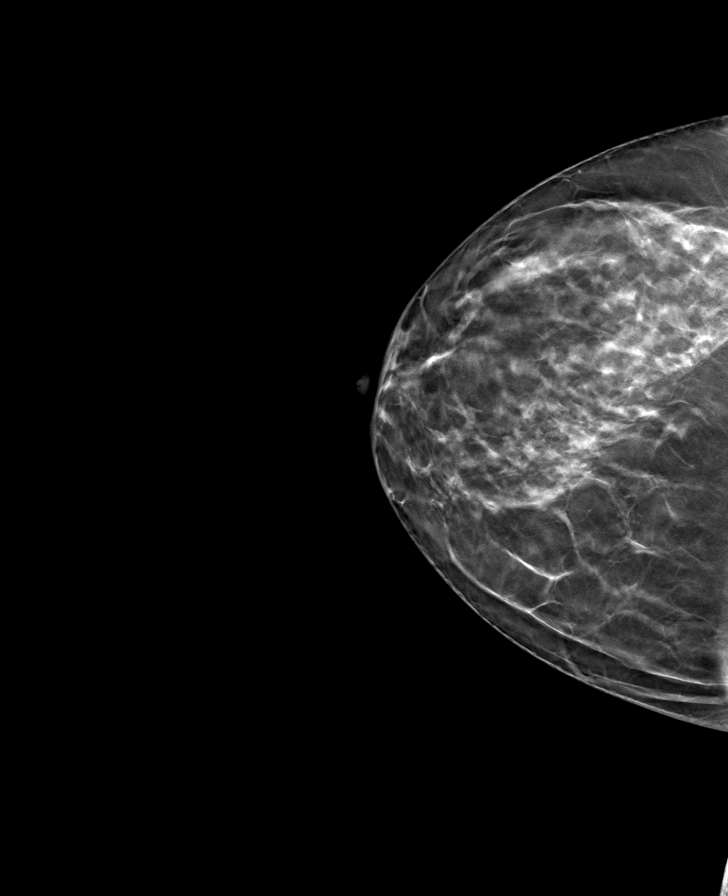

[8 of 24 positions shown; findings below may reference images not displayed]

ACR Breast Density Category b: There are scattered areas of
fibroglandular density.
FINDINGS: There are no findings suspicious for malignancy. Images were
processed with CAD.
IMPRESSION: No mammographic evidence of malignancy. A result letter of this
screening mammogram will be mailed directly to the patient.

RECOMMENDATION:
Screening mammogram in one year. (Code:[id number])

BI-RADS CATEGORY  1: Negative.

## 2018-04-17 ENCOUNTER — Other Ambulatory Visit: Payer: Self-pay | Admitting: Oncology

## 2018-04-17 DIAGNOSIS — Z1231 Encounter for screening mammogram for malignant neoplasm of breast: Secondary | ICD-10-CM

## 2018-05-15 ENCOUNTER — Ambulatory Visit: Payer: Commercial Managed Care - PPO

## 2018-05-15 ENCOUNTER — Encounter: Payer: Commercial Managed Care - PPO | Admitting: Family Medicine

## 2018-05-25 ENCOUNTER — Other Ambulatory Visit: Payer: Self-pay | Admitting: Family Medicine

## 2018-06-14 ENCOUNTER — Ambulatory Visit: Payer: Commercial Managed Care - PPO

## 2018-08-03 ENCOUNTER — Other Ambulatory Visit: Payer: Self-pay

## 2018-08-03 ENCOUNTER — Ambulatory Visit
Admission: RE | Admit: 2018-08-03 | Discharge: 2018-08-03 | Disposition: A | Discharge status: Home / Self Care | Payer: Commercial Managed Care - PPO | Source: Ambulatory Visit | Attending: Oncology | Admitting: Oncology

## 2018-08-03 DIAGNOSIS — Z1231 Encounter for screening mammogram for malignant neoplasm of breast: Secondary | ICD-10-CM

## 2018-09-18 ENCOUNTER — Other Ambulatory Visit: Payer: Self-pay

## 2018-09-20 ENCOUNTER — Ambulatory Visit (INDEPENDENT_AMBULATORY_CARE_PROVIDER_SITE_OTHER): Payer: Commercial Managed Care - PPO | Admitting: Family Medicine

## 2018-09-20 ENCOUNTER — Other Ambulatory Visit: Payer: Self-pay

## 2018-09-20 ENCOUNTER — Encounter: Payer: Self-pay | Admitting: Family Medicine

## 2018-09-20 ENCOUNTER — Ambulatory Visit (INDEPENDENT_AMBULATORY_CARE_PROVIDER_SITE_OTHER): Payer: Commercial Managed Care - PPO

## 2018-09-20 VITALS — BP 110/70 | HR 77 | Temp 98.1°F | Ht 64.0 in | Wt 153.4 lb

## 2018-09-20 DIAGNOSIS — Z0001 Encounter for general adult medical examination with abnormal findings: Secondary | ICD-10-CM

## 2018-09-20 DIAGNOSIS — E039 Hypothyroidism, unspecified: Secondary | ICD-10-CM

## 2018-09-20 DIAGNOSIS — Z130 Encounter for screening for diseases of the blood and blood-forming organs and certain disorders involving the immune mechanism: Secondary | ICD-10-CM | POA: Diagnosis not present

## 2018-09-20 DIAGNOSIS — R7303 Prediabetes: Secondary | ICD-10-CM | POA: Diagnosis not present

## 2018-09-20 DIAGNOSIS — M79641 Pain in right hand: Secondary | ICD-10-CM | POA: Insufficient documentation

## 2018-09-20 DIAGNOSIS — M25541 Pain in joints of right hand: Secondary | ICD-10-CM

## 2018-09-20 DIAGNOSIS — E785 Hyperlipidemia, unspecified: Secondary | ICD-10-CM

## 2018-09-20 DIAGNOSIS — M25542 Pain in joints of left hand: Secondary | ICD-10-CM

## 2018-09-20 LAB — CBC
HCT: 39.8 % (ref 36.0–46.0)
Hemoglobin: 13.1 g/dL (ref 12.0–15.0)
MCHC: 32.9 g/dL (ref 30.0–36.0)
MCV: 94.9 fl (ref 78.0–100.0)
Platelets: 239 10*3/uL (ref 150.0–400.0)
RBC: 4.19 Mil/uL (ref 3.87–5.11)
RDW: 13.3 % (ref 11.5–15.5)
WBC: 7 10*3/uL (ref 4.0–10.5)

## 2018-09-20 LAB — COMPREHENSIVE METABOLIC PANEL
ALT: 17 U/L (ref 0–35)
AST: 16 U/L (ref 0–37)
Albumin: 4.3 g/dL (ref 3.5–5.2)
Alkaline Phosphatase: 63 U/L (ref 39–117)
BUN: 13 mg/dL (ref 6–23)
CO2: 29 mEq/L (ref 19–32)
Calcium: 9.5 mg/dL (ref 8.4–10.5)
Chloride: 104 mEq/L (ref 96–112)
Creatinine, Ser: 0.7 mg/dL (ref 0.40–1.20)
GFR: 87.05 mL/min (ref >60.00)
Glucose, Bld: 108 mg/dL — ABNORMAL HIGH (ref 70–99)
Potassium: 4.1 mEq/L (ref 3.5–5.1)
Sodium: 141 mEq/L (ref 135–145)
Total Bilirubin: 0.4 mg/dL (ref 0.2–1.2)
Total Protein: 6.8 g/dL (ref 6.0–8.3)

## 2018-09-20 LAB — LIPID PANEL
Cholesterol: 230 mg/dL — ABNORMAL HIGH (ref 0–200)
HDL: 57.2 mg/dL (ref >39.00)
NonHDL: 173.29
Total CHOL/HDL Ratio: 4
Triglycerides: 211 mg/dL — ABNORMAL HIGH (ref 0.0–149.0)
VLDL: 42.2 mg/dL — ABNORMAL HIGH (ref 0.0–40.0)

## 2018-09-20 LAB — HEMOGLOBIN A1C: Hgb A1c MFr Bld: 6.2 % (ref 4.6–6.5)

## 2018-09-20 LAB — TSH: TSH: 3.96 u[IU]/mL (ref 0.35–4.50)

## 2018-09-20 LAB — LDL CHOLESTEROL, DIRECT: Direct LDL: 145 mg/dL

## 2018-09-20 NOTE — Assessment & Plan Note (Signed)
Bilateral hand pain.  Discussed referral to rheumatology versus x-rays of 1 of her hands to look for inflammatory arthritis findings.  She opted for x-ray.  This will be completed today.

## 2018-09-20 NOTE — Assessment & Plan Note (Signed)
Physical exam completed.  Encouraged continued activity and healthy diet.  She will continue to follow with oncology for breast exams and mammograms.  Discussed doing a pelvic exam this year versus next year as she is not due for a Pap smear.  She opted to have this completed next year.  Discussed if she developed any vaginal bleeding at any point she would need to be evaluated as that would be abnormal after being postmenopausal.  She will discuss her sisters history of gynecologic cancer with her oncologist to determine if genetic screening is needed.  She will monitor for any abdominal discomfort.  She reports she has had the tenderness evaluated previously and it is unchanged over many years.  Lab work as outlined below.

## 2018-09-20 NOTE — Patient Instructions (Addendum)
Nice to see you. We will check labs today.  Please continue with a healthy diet and exercise.

## 2018-09-20 NOTE — Progress Notes (Signed)
Tommi Rumps, MD Phone: [phone]  Erica Woods is a 53 y.o. female who presents today for CPE.  Diet: Consist of vegetables and meat.  She is now just drinking water. Exercise: Walking 2 to 3 miles 3 to 4 days a week. Mammogram up-to-date 08/03/2018.  Followed by oncology given her history of breast cancer. Pap smear up-to-date 04/16/2016 with negative cells and HPV.  No history of abnormal Pap smears.  She is postmenopausal. Colonoscopy up-to-date 10/23/2014 with 10-year recall. No family history of colon cancer.  She reports a history of gynecologic cancer in her sister though she is not 100% sure if it was ovarian cancer.  She will discuss this further with her oncologist. Up-to-date on tetanus vaccine and Shingrix. No tobacco use or illicit drug use.  No alcohol use. She sees a Pharmacist, community and an ophthalmologist. She reports joint pains in her hands over the last 6 months.  She has a history of psoriasis.  Her hands mostly bother her when she squeezes her hands into a fist. She has chronic lower abdominal tenderness that has been going on for years and is unchanged.  She saw a gynecologist previously for this and they advised her that it was likely related to her history of chronic bowel issues.  Active Ambulatory Problems    Diagnosis Date Noted  . 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 Migraines 11/25/2014  . GERD (gastroesophageal reflux disease) 11/25/2014  . Encounter for general adult medical examination with abnormal findings 05/02/2015  . 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 Arthralgia of both hands 05/04/2016  . Hypothyroidism 11/08/2016  . Muscle cramps 11/08/2016  . Plantar fasciitis, left 07/05/2016  . Psoriasis (a type of skin inflammation) 07/05/2016  . Hot flashes 07/29/2017  . Hyperlipidemia 07/29/2017  . Stress 11/14/2017  . Prediabetes 09/20/2018  . Pain of right hand 09/20/2018   Resolved Ambulatory Problems     Diagnosis Date Noted  . Neoplasm of breast, 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/2014  . Sinusitis, acute frontal 01/28/2016  . Dysuria 10/08/2016   Past Medical History:  Diagnosis Date  . Allergic rhinitis   . Breast cancer ([place])   . Chickenpox   . Headache   . Hemorrhoids   . History of blood transfusion   . Hypercholesteremia   . Hyperthyroidism     Family History  Problem Relation Age of Onset  . Heart attack Father   . Hypertension Unknown   . Diabetes Unknown     Social History   Socioeconomic History  . Marital status: Married    Spouse name: Not on file  . Number of children: Not on file  . Years of education: Not on file  . Highest education level: Not on file  Occupational History  . Not on file  Social Needs  . Financial resource strain: Not on file  . Food insecurity    Worry: Not on file    Inability: Not on file  . Transportation needs    Medical: Not on file    Non-medical: Not on file  Tobacco Use  . Smoking status: Never Smoker  . Smokeless tobacco: Never Used  Substance and Sexual Activity  . Alcohol use: No  . Drug use: No  . Sexual activity: Not on file  Lifestyle  . Physical activity    Days per week: Not on file    Minutes per session: Not on file  .  Stress: Not on file  Relationships  . Social Herbalist on phone: Not on file    Gets together: Not on file    Attends religious service: Not on file    Active member of club or organization: Not on file    Attends meetings of clubs or organizations: Not on file    Relationship status: Not on file  . Intimate partner violence    Fear of current or ex partner: Not on file    Emotionally abused: Not on file    Physically abused: Not on file    Forced sexual activity: Not on file  Other Topics Concern  . Not on file  Social History Narrative  . Not on file    ROS  General:  Negative for nexplained weight loss, fever Skin:  Negative for new or changing mole, sore that won't heal HEENT: Negative for trouble hearing, trouble seeing, ringing in ears, mouth sores, hoarseness, change in voice, dysphagia. CV:  Negative for chest pain, dyspnea, edema, palpitations Resp: Negative for cough, dyspnea, hemoptysis GI: Negative for nausea, vomiting, diarrhea, constipation, abdominal pain, melena, hematochezia. GU: Negative for dysuria, incontinence, urinary hesitance, hematuria, vaginal or penile discharge, polyuria, sexual difficulty, lumps in testicle or breasts MSK: Negative for muscle cramps or aches, positive for joint pain or swelling Neuro: Negative for headaches, weakness, numbness, dizziness, passing out/fainting Psych: Negative for depression, anxiety, memory problems  Objective  Physical Exam Vitals:   09/20/18 0810  BP: 110/70  Pulse: 77  Temp: 98.1 F (36.7 C)  SpO2: 97%    BP Readings from Last 3 Encounters:  09/20/18 110/70  02/07/18 116/72  11/28/17 115/62   Wt Readings from Last 3 Encounters:  09/20/18 153 lb 6.4 oz (69.6 kg)  02/07/18 163 lb 12.8 oz (74.3 kg)  11/28/17 154 lb (69.9 kg)    Physical Exam Constitutional:      General: She is not in acute distress.    Appearance: She is not diaphoretic.  HENT:     Head: Normocephalic and atraumatic.  Eyes:     Conjunctiva/sclera: Conjunctivae normal.     Pupils: Pupils are equal, round, and reactive to light.  Cardiovascular:     Rate and Rhythm: Normal rate and regular rhythm.     Heart sounds: Normal heart sounds.  Pulmonary:     Effort: Pulmonary effort is normal.     Breath sounds: Normal breath sounds.  Abdominal:     General: Bowel sounds are normal. There is no distension.     Palpations: Abdomen is soft. There is no mass.     Tenderness: There is abdominal tenderness (Mildly tender over lower abdomen). There is no guarding or rebound.  Musculoskeletal:     Comments: Bilateral hands with no joint swelling, there is slight  discomfort on palpation of the PIP and DIP joints bilaterally, no warmth or erythema in her hands  Skin:    General: Skin is warm and dry.     Comments: Scattered psoriatic lesions on her hands  Neurological:     Mental Status: She is alert.  Psychiatric:        Mood and Affect: Mood normal.      Assessment/Plan:   Encounter for general adult medical examination with abnormal findings Physical exam completed.  Encouraged continued activity and healthy diet.  She will continue to follow with oncology for breast exams and mammograms.  Discussed doing a pelvic exam this year versus next year as she  is not due for a Pap smear.  She opted to have this completed next year.  Discussed if she developed any vaginal bleeding at any point she would need to be evaluated as that would be abnormal after being postmenopausal.  She will discuss her sisters history of gynecologic cancer with her oncologist to determine if genetic screening is needed.  She will monitor for any abdominal discomfort.  She reports she has had the tenderness evaluated previously and it is unchanged over many years.  Lab work as outlined below.  Arthralgia of both hands Bilateral hand pain.  Discussed referral to rheumatology versus x-rays of 1 of her hands to look for inflammatory arthritis findings.  She opted for x-ray.  This will be completed today.   Orders Placed This Encounter  Procedures  . DG Hand Complete Right    Standing Status:   Future    Number of Occurrences:   1    Standing Expiration Date:   11/20/2019    Order Specific Question:   Reason for Exam (SYMPTOM  OR DIAGNOSIS REQUIRED)    Answer:   pain in PIP and DIP joints of all fingers, history of psoriasis    Order Specific Question:   Is patient pregnant?    Answer:   No    Order Specific Question:   Preferred imaging location?    Answer:   [facility] Specific Question:   Radiology Contrast Protocol - do NOT remove file path     Answer:   [email or web address]  . Comp Met (CMET)  . TSH  . CBC  . HgB A1c  . Lipid panel  . LDL cholesterol, direct    No orders of the defined types were placed in this encounter.    Tommi Rumps, MD [place]

## 2018-09-26 ENCOUNTER — Other Ambulatory Visit: Payer: Self-pay | Admitting: Family Medicine

## 2018-11-28 ENCOUNTER — Other Ambulatory Visit: Payer: Self-pay | Admitting: *Deleted

## 2018-11-28 DIAGNOSIS — D0502 Lobular carcinoma in situ of left breast: Secondary | ICD-10-CM

## 2018-11-29 ENCOUNTER — Inpatient Hospital Stay: Payer: Commercial Managed Care - PPO | Admitting: Oncology

## 2018-11-29 ENCOUNTER — Other Ambulatory Visit: Payer: Self-pay

## 2018-11-29 ENCOUNTER — Inpatient Hospital Stay: Payer: Commercial Managed Care - PPO | Attending: Oncology

## 2018-11-29 VITALS — BP 108/45 | HR 63 | Temp 98.0°F | Resp 18 | Ht 64.0 in | Wt 152.1 lb

## 2018-11-29 DIAGNOSIS — D0502 Lobular carcinoma in situ of left breast: Secondary | ICD-10-CM | POA: Diagnosis present

## 2018-11-29 DIAGNOSIS — Z79810 Long term (current) use of selective estrogen receptor modulators (SERMs): Secondary | ICD-10-CM | POA: Insufficient documentation

## 2018-11-29 LAB — CBC WITH DIFFERENTIAL (CANCER CENTER ONLY)
Abs Immature Granulocytes: 0.01 10*3/uL (ref 0.00–0.07)
Basophils Absolute: 0 10*3/uL (ref 0.0–0.1)
Basophils Relative: 1 %
Eosinophils Absolute: 0.1 10*3/uL (ref 0.0–0.5)
Eosinophils Relative: 3 %
HCT: 40.1 % (ref 36.0–46.0)
Hemoglobin: 13.1 g/dL (ref 12.0–15.0)
Immature Granulocytes: 0 %
Lymphocytes Relative: 34 %
Lymphs Abs: 1.8 10*3/uL (ref 0.7–4.0)
MCH: 30.6 pg (ref 26.0–34.0)
MCHC: 32.7 g/dL (ref 30.0–36.0)
MCV: 93.7 fL (ref 80.0–100.0)
Monocytes Absolute: 0.6 10*3/uL (ref 0.1–1.0)
Monocytes Relative: 11 %
Neutro Abs: 2.7 10*3/uL (ref 1.7–7.7)
Neutrophils Relative %: 51 %
Platelet Count: 223 10*3/uL (ref 150–400)
RBC: 4.28 MIL/uL (ref 3.87–5.11)
RDW: 12.4 % (ref 11.5–15.5)
WBC Count: 5.2 10*3/uL (ref 4.0–10.5)
nRBC: 0 % (ref 0.0–0.2)

## 2018-11-29 LAB — CMP (CANCER CENTER ONLY)
ALT: 17 U/L (ref 0–44)
AST: 16 U/L (ref 15–41)
Albumin: 3.6 g/dL (ref 3.5–5.0)
Alkaline Phosphatase: 73 U/L (ref 38–126)
Anion gap: 10 (ref 5–15)
BUN: 12 mg/dL (ref 6–20)
CO2: 27 mmol/L (ref 22–32)
Calcium: 8.9 mg/dL (ref 8.9–10.3)
Chloride: 106 mmol/L (ref 98–111)
Creatinine: 0.75 mg/dL (ref 0.44–1.00)
GFR, Est AFR Am: >60 mL/min (ref >60)
GFR, Estimated: >60 mL/min (ref >60)
Glucose, Bld: 112 mg/dL — ABNORMAL HIGH (ref 70–99)
Potassium: 4.5 mmol/L (ref 3.5–5.1)
Sodium: 143 mmol/L (ref 135–145)
Total Bilirubin: 0.3 mg/dL (ref 0.3–1.2)
Total Protein: 6.9 g/dL (ref 6.5–8.1)

## 2018-11-29 MED ORDER — GABAPENTIN 100 MG PO CAPS: 100.0000 mg | ORAL_CAPSULE | Freq: Every day | ORAL | 6 refills | Status: DC

## 2018-11-29 MED ORDER — TAMOXIFEN CITRATE 20 MG PO TABS: 20.0000 mg | ORAL_TABLET | Freq: Every day | ORAL | Status: DC

## 2018-11-29 MED ORDER — TAMOXIFEN CITRATE 20 MG PO TABS: 20.0000 mg | ORAL_TABLET | Freq: Every day | ORAL | 4 refills | Status: DC

## 2018-11-29 NOTE — Progress Notes (Signed)
[place]  [phone] [phone]     ID: NEKEYA DAME DOB: 08-Nov-1965  MR#: [id number]  [id number]  Patient Care Team: Leone Haven, MD as PCP - General (Family Medicine) Regina Coppolino, Virgie Dad, MD as Consulting Physician (Oncology) Rogene Houston, MD as Consulting Physician (Gastroenterology) PCP: Leone Haven, MD GYN: OTHER MD:  CHIEF COMPLAINT: lobular carcinoma in situ of the left breast  CURRENT TREATMENT: tamoxifen   BREAST CANCER HISTORY: From the earlier intake note  Palma [facility]. This showed in the left breast some suspicious calcifications, and on 08/27/2014 the patient underwent digital left diagnostic mammography. Breast density was category C. The study showed 2 adjacent groups of pleomorphic calcifications one measuring 8 mm at the 12:30 o'clock position and another one 0.4 mm in the central left breast. The distance between these groups was 4.5 cm. There were no other findings of concern.  On 09/03/2014 [name] underwent biopsy of the 2 left breast areas in question. The larger area at 12:30 o'clock showed atypical ductal hyperplasia as did the separate more central left breast lesion (SAA [id number]).  The patient was then referred to Dr. [name] and after appropriate discussion he proceeded to needle localization excisional biopsies 2. The result of this "double lumpectomy" on 09/03/2014 showed ([place] [phone]) at the larger lesion, at 12:30 o'clock, marked with an X shaped clip, a benign complex sclerosing lesion/radial scar associated with lobular carcinoma in situ. In the more central, smaller lesion, marked with a cylinder shaped clip, there was again atypical lobular hyperplasia and lobular carcinoma in situ..  Because of these concerns, on 10/18/2014 the patient underwent bilateral breast MRIs. The right breast showed scattered benign appearing  enhancing foci. In the left breast there was a seroma involving both the upper outer and lower outer quadrant measuring up to 5.4 cm. There were no other areas of concern and no abnormal adenopathy or other ancillary findings. Because of both the right breast and left breast findings a repeat MRI of the breast in 6 months was recommended.  The patient's subsequent history is as detailed below   INTERVAL HISTORY: Anecia returns today for follow-up of her lobular carcinoma in site to. She was last seen here on 11/28/2017.   She continues on tamoxifen.  She tolerates this well, with no unusual side effects.  She takes gabapentin at night which helps with the nighttime hot flashes.  Since her last visit here, she underwent a digital screening bilateral mammogram with tomography on 08/03/2018 showing: Breast Density Category B. There is no mammographic evidence of malignancy.   She also underwent a right hand xray on 09/20/2018 showing: Mild interphalangeal joint osteoarthritis. The exam is otherwise negative.   REVIEW OF SYSTEMS: Breeona tells me she was exposed at work to the coronavirus and had to quarantine for 2 weeks.  They have had to residents positive and 1 staff person positive but otherwise they are doing pretty well where she works.  At home it is she and her husband and 2 sons and they are all being appropriately cautious at work.  Otherwise detailed review of systems today was entirely benign    Past Medical History:  Diagnosis Date  . Allergic rhinitis   . Breast cancer ([place])   . Chickenpox   . GERD (gastroesophageal reflux disease)   . Headache   . Hemorrhoids   . History of blood transfusion   . Hypercholesteremia   .  Hyperthyroidism     PAST SURGICAL HISTORY: Past Surgical History:  Procedure Laterality Date  . ABDOMINAL SURGERY    . APPENDECTOMY    . BALLOON DILATION N/A 02/04/2017   Procedure: BALLOON DILATION;  Surgeon: [name], MD;  Location: [facility] ENDOSCOPY;   Service: Gastroenterology;  Laterality: N/A;  . BREAST BIOPSY Left 10/02/2014   Procedure: LEFT BREAST BIOPSY AFTER NEEDLE LOCALIZATION X TWO;  Surgeon: [name], MD;  Location: [facility] ORS;  Service: General;  Laterality: Left;  . BREAST EXCISIONAL BIOPSY Left 2016  . BREAST LUMPECTOMY  2016   benign  . COLONOSCOPY N/A 10/23/2014   Procedure: COLONOSCOPY;  Surgeon: Rogene Houston, MD;  Location: [facility] ENDO SUITE;  Service: Endoscopy;  Laterality: N/A;  730  . ESOPHAGOGASTRODUODENOSCOPY (EGD) WITH PROPOFOL N/A 02/04/2017   Procedure: ESOPHAGOGASTRODUODENOSCOPY (EGD) WITH PROPOFOL;  Surgeon: [name], MD;  Location[facility] ENDOSCOPY;  Service: Gastroenterology;  Laterality: N/A;  . LAPAROSCOPIC APPENDECTOMY  03/02/2012   Procedure: APPENDECTOMY LAPAROSCOPIC;  Surgeon: [name], MD;  Location: [facility] ORS;  Service: General;  Laterality: N/A;  . OVARIAN CYST REMOVAL Left   . TUBAL LIGATION    . WISDOM TOOTH EXTRACTION Bilateral     FAMILY HISTORY Family History  Problem Relation Age of Onset  . Heart attack Father   . Hypertension Unknown   . Diabetes Unknown   the patient's father died from a heart attack at age 29. The patient's mother is living, at age 60. The patient had 1 full brother and one full sister, then 2 half sisters. The full sister was diagnosed with either cancer of the uterus or perhaps more likely cancer of the cervix in her 92s (the patient tells me that her sister had 2 babies after being diagnosed, so uterine cancer appears unlikely). There is no other history of breast or ovarian cancer in the family and no history of colon cancer in the family  GYNECOLOGIC HISTORY:  Patient's last menstrual period was 10/09/2014. Menarche age 70, first live birth age 89. She is GX P2. She is still having regular periods, most recently late August. ("This one is a little late". 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 Currently she is doing third shift 8 hours 5 days a week. She also sits sometimes for private clients. Her husband [name] worked for the Technical sales engineer chiefly on [place]. Son [name] in [name] is a Metallurgist, doing body work, and son [name] in [place] is a Health and safety inspector. The patient has no grandchildren. She attends a local [employer]: not in place; the patient is aware that her husband would be her healthcare power of attorney by default in casesomething happened to her and she is comfortable with that   HEALTH MAINTENANCE: Social History   Tobacco Use  . Smoking status: Never Smoker  . Smokeless tobacco: Never Used  Substance Use Topics  . Alcohol use: No  . Drug use: No     Colonoscopy: 10/23/2014; unremarkable  PAP:06/21/2014  Bone density:never  Lipid panel:  Allergies  Allergen Reactions  . Gold-Containing Drug Products     Allergic to GOLD     Current Outpatient Medications  Medication Sig Dispense Refill  . aspirin-acetaminophen-caffeine (EXCEDRIN MIGRAINE) 250-250-65 MG tablet Take 1 tablet by mouth every 6 (six) hours as needed for headache. 30 tablet 0  .  gabapentin (NEURONTIN) 100 MG capsule Take 1-2 capsules (100-200 mg total) by mouth at bedtime. 120 capsule 6  . NP THYROID 30 MG tablet TAKE 1 & 1/2 (ONE & ONE-HALF) TABLETS BY MOUTH ONCE DAILY BEFORE BREAKFAST 135 tablet 0  . omeprazole (PRILOSEC) 40 MG capsule Take 1 capsule (40 mg total) by mouth daily. 90 capsule 3  . tamoxifen (NOLVADEX) 20 MG tablet Take 1 tablet (20 mg total) by mouth daily. 90 tablet 4   No current facility-administered medications for this visit.     OBJECTIVE: middle-aged white woman in no acute distress  Vitals:   11/29/18 0939  BP: (!) 108/45  Pulse: 63  Resp: 18  Temp: 98 F (36.7 C)  SpO2: 100%   Wt Readings from Last 3 Encounters:   11/29/18 152 lb 1.6 oz (69 kg)  09/20/18 153 lb 6.4 oz (69.6 kg)  02/07/18 163 lb 12.8 oz (74.3 kg)   Body mass index is 26.11 kg/m.    ECOG FS:0 - Asymptomatic  Ocular: Sclerae unicteric, pupils round and equal Ear-nose-throat: Wearing a mask Lymphatic: No cervical or supraclavicular adenopathy Lungs no rales or rhonchi Heart regular rate and rhythm Abd soft, nontender, positive bowel sounds MSK no focal spinal tenderness, no joint edema Neuro: non-focal, well-oriented, appropriate affect Breasts: The right breast is benign.  The left breast is status post lumpectomy.  There is no evidence of disease recurrence.  Both axillae are benign   LAB RESULTS:  CMP     Component Value Date/Time   NA 141 09/20/2018 0839   NA 141 11/22/2016 1228   K 4.1 09/20/2018 0839   K 4.2 11/22/2016 1228   CL 104 09/20/2018 0839   CO2 29 09/20/2018 0839   CO2 28 11/22/2016 1228   GLUCOSE 108 (H) 09/20/2018 0839   GLUCOSE 97 11/22/2016 1228   BUN 13 09/20/2018 0839   BUN 9.1 11/22/2016 1228   CREATININE 0.70 09/20/2018 0839   CREATININE 0.8 11/22/2016 1228   CALCIUM 9.5 09/20/2018 0839   CALCIUM 9.4 11/22/2016 1228   PROT 6.8 09/20/2018 0839   PROT 7.1 11/22/2016 1228   ALBUMIN 4.3 09/20/2018 0839   ALBUMIN 3.5 11/22/2016 1228   AST 16 09/20/2018 0839   AST 23 11/22/2016 1228   ALT 17 09/20/2018 0839   ALT 27 11/22/2016 1228   ALKPHOS 63 09/20/2018 0839   ALKPHOS 69 11/22/2016 1228   BILITOT 0.4 09/20/2018 0839   BILITOT 0.42 11/22/2016 1228   GFRNONAA >60 11/28/2017 0815   GFRAA >60 11/28/2017 0815    INo results found for: SPEP, UPEP  Lab Results  Component Value Date   WBC 5.2 11/29/2018   NEUTROABS 2.7 11/29/2018   HGB 13.1 11/29/2018   HCT 40.1 11/29/2018   MCV 93.7 11/29/2018   PLT 223 11/29/2018      Chemistry      Component Value Date/Time   NA 141 09/20/2018 0839   NA 141 11/22/2016 1228   K 4.1 09/20/2018 0839   K 4.2 11/22/2016 1228   CL 104 09/20/2018  0839   CO2 29 09/20/2018 0839   CO2 28 11/22/2016 1228   BUN 13 09/20/2018 0839   BUN 9.1 11/22/2016 1228   CREATININE 0.70 09/20/2018 0839   CREATININE 0.8 11/22/2016 1228      Component Value Date/Time   CALCIUM 9.5 09/20/2018 0839   CALCIUM 9.4 11/22/2016 1228   ALKPHOS 63 09/20/2018 0839   ALKPHOS 69 11/22/2016 1228   AST 16 09/20/2018  0839   AST 23 11/22/2016 1228   ALT 17 09/20/2018 0839   ALT 27 11/22/2016 1228   BILITOT 0.4 09/20/2018 0839   BILITOT 0.42 11/22/2016 1228       No results found for: LABCA2  No components found for: LABCA125  No results for input(s): INR in the last 168 hours.  Urinalysis    Component Value Date/Time   COLORURINE YELLOW 03/02/2012 1740   APPEARANCEUR CLEAR 03/02/2012 1740   LABSPEC <1.005 (L) 03/02/2012 1740   PHURINE 6.5 03/02/2012 1740   GLUCOSEU NEGATIVE 03/02/2012 1740   HGBUR MODERATE (A) 03/02/2012 1740   BILIRUBINUR neg 10/08/2016 1009   KETONESUR NEGATIVE 03/02/2012 1740   PROTEINUR neg 10/08/2016 1009   PROTEINUR NEGATIVE 03/02/2012 1740   UROBILINOGEN 0.2 10/08/2016 1009   UROBILINOGEN 1.0 03/02/2012 1740   NITRITE neg 10/08/2016 1009   NITRITE NEGATIVE 03/02/2012 1740   LEUKOCYTESUR Negative 10/08/2016 1009    STUDIES: No results found.   ASSESSMENT: 53 y.o. [place], [place] woman status post core needle biopsy 2 on 09/03/2014, both areas showing atypical ductal hyperplasia  (1) s/p Left breast excisional biopsy x2 on 10/02/2014 for a complex sclerosing lesion/radial scar associated with atypical lobular hyperplasia and lobular carcinoma is situ, and a separate area showing also lobular carcinoma in situ  (2) on prophylactic tamoxifen as of March 2017   PLAN:  Kolbey is now a little over 4 years from her surgery and she is 3-1/2 years into her 5 years of tamoxifen.  She is tolerating this remarkably well and the plan is to continue that for total of 5 years.  She will have her next mammogram June 2020.  She  will see me October 2020 and likely that will be her "graduation" visit even though she will continue tamoxifen through February 2022  I have encouraged her to do some walking in addition to her very active work schedule  She is taking appropriate pandemic precautions  She knows to call for any other issue that may develop before the next visit.  Lavern Crimi, Virgie Dad, MD  11/29/18 10:04 AM Medical Oncology and Hematology [facility] [place],  [address] Tel. [phone]    Fax. [phone]  I, Jacqualyn Posey am acting as a Education administrator for Chauncey Cruel, MD.   I, Lurline Del MD, have reviewed the above documentation for accuracy and completeness, and I agree with the above.

## 2018-11-30 ENCOUNTER — Telehealth: Payer: Self-pay | Admitting: Oncology

## 2018-11-30 NOTE — Telephone Encounter (Signed)
I talk with patient regarding schedule  

## 2018-12-25 ENCOUNTER — Other Ambulatory Visit: Payer: Self-pay | Admitting: Family Medicine

## 2019-03-12 ENCOUNTER — Other Ambulatory Visit: Payer: Self-pay | Admitting: Gastroenterology

## 2019-03-12 DIAGNOSIS — R1310 Dysphagia, unspecified: Secondary | ICD-10-CM

## 2019-03-26 ENCOUNTER — Other Ambulatory Visit: Payer: Self-pay

## 2019-03-26 ENCOUNTER — Encounter: Payer: Self-pay | Admitting: Family Medicine

## 2019-03-26 ENCOUNTER — Telehealth: Payer: Self-pay | Admitting: Family Medicine

## 2019-03-26 ENCOUNTER — Ambulatory Visit (INDEPENDENT_AMBULATORY_CARE_PROVIDER_SITE_OTHER): Payer: Commercial Managed Care - PPO | Admitting: Family Medicine

## 2019-03-26 DIAGNOSIS — K219 Gastro-esophageal reflux disease without esophagitis: Secondary | ICD-10-CM

## 2019-03-26 DIAGNOSIS — E039 Hypothyroidism, unspecified: Secondary | ICD-10-CM | POA: Diagnosis not present

## 2019-03-26 DIAGNOSIS — R7303 Prediabetes: Secondary | ICD-10-CM

## 2019-03-26 MED ORDER — THYROID 30 MG PO TABS: ORAL_TABLET | ORAL | 3 refills | Status: DC

## 2019-03-26 NOTE — Progress Notes (Signed)
Virtual Visit via telephone Note  This visit type was conducted due to national recommendations for restrictions regarding the COVID-19 pandemic (e.g. social distancing).  This format is felt to be most appropriate for this patient at this time.  All issues noted in this document were discussed and addressed.  No physical exam was performed (except for noted visual exam findings with Video Visits).   I connected with Erica Woods today at  8:30 AM EST by telephone and verified that I am speaking with the correct person using two identifiers. Location patient: home Location provider: work Persons participating in the virtual visit: patient, provider, Zahlia Valentini (husband)  I discussed the limitations, risks, security and privacy concerns of performing an evaluation and management service by telephone and the availability of in person appointments. I also discussed with the patient that there may be a patient responsible charge related to this service. The patient expressed understanding and agreed to proceed.  Interactive audio and video telecommunications were attempted between this provider and patient, however failed, due to patient having technical difficulties OR patient did not have access to video capability.  We continued and completed visit with audio only.  Reason for visit: follow-up  HPI: GERD:   Reflux symptoms: no   Abd pain: no   Blood in stool: no  Dysphagia: no   EGD: prior EGD in 2018 with schatzki ring, eosinophilic esophagitis  Medication: taking omeprazole  HYPOTHYROIDISM Disease Monitoring Weight changes: stable  Skin Changes: no Heat/Cold intolerance: no  Medication Monitoring Compliance:  Taking NP thyroid   Last TSH:   Lab Results  Component Value Date   TSH 3.96 09/20/2018   Prediabetes: No polyuria or polydipsia.  She is eating lots of fruits and vegetables and meats.  She has gotten off of sweet tea and is now drinking mostly water.  She does eat  some sweets and junk food.  She walks twice weekly for exercise.  Patient notes she is not going to get the COVID-19 vaccine at this time.  She does work in a nursing home.  She does not feel comfortable getting it now as she feels as though it has not been out long enough to know that it safe and she does not like the sound of some of the side effects she is heard of.  They do test twice weekly at her work and they are wearing N95 masks while at work.   ROS: See pertinent positives and negatives per HPI.  Past Medical History:  Diagnosis Date  . Allergic rhinitis   . Breast cancer ([place])   . Chickenpox   . GERD (gastroesophageal reflux disease)   . Headache   . Hemorrhoids   . History of blood transfusion   . Hypercholesteremia   . Hyperthyroidism     Past Surgical History:  Procedure Laterality Date  . ABDOMINAL SURGERY    . APPENDECTOMY    . BALLOON DILATION N/A 02/04/2017   Procedure: BALLOON DILATION;  Surgeon: [name], MD;  Location: [facility] ENDOSCOPY;  Service: Gastroenterology;  Laterality: N/A;  . BREAST BIOPSY Left 10/02/2014   Procedure: LEFT BREAST BIOPSY AFTER NEEDLE LOCALIZATION X TWO;  Surgeon: [name], MD;  Location: [facility] ORS;  Service: General;  Laterality: Left;  . BREAST EXCISIONAL BIOPSY Left 2016  . BREAST LUMPECTOMY  2016   benign  . COLONOSCOPY N/A 10/23/2014   Procedure: COLONOSCOPY;  Surgeon: Rogene Houston, MD;  Location: [facility] ENDO SUITE;  Service: Endoscopy;  Laterality: N/A;  730  . ESOPHAGOGASTRODUODENOSCOPY (EGD) WITH PROPOFOL N/A 02/04/2017   Procedure: ESOPHAGOGASTRODUODENOSCOPY (EGD) WITH PROPOFOL;  Surgeon: [name], MD;  Location: [facility] ENDOSCOPY;  Service: Gastroenterology;  Laterality: N/A;  . LAPAROSCOPIC APPENDECTOMY  03/02/2012   Procedure: APPENDECTOMY LAPAROSCOPIC;  Surgeon: [name], MD;  Location: [facility] ORS;  Service: General;  Laterality: N/A;  . OVARIAN CYST REMOVAL Left   . TUBAL LIGATION    . WISDOM TOOTH EXTRACTION Bilateral      Family History  Problem Relation Age of Onset  . Heart attack Father   . Hypertension Unknown   . Diabetes Unknown     SOCIAL HX: Non-smoker   Current Outpatient Medications:  .  aspirin-acetaminophen-caffeine (EXCEDRIN MIGRAINE) 250-250-65 MG tablet, Take 1 tablet by mouth every 6 (six) hours as needed for headache., Disp: 30 tablet, Rfl: 0 .  gabapentin (NEURONTIN) 100 MG capsule, Take 1-2 capsules (100-200 mg total) by mouth at bedtime., Disp: 120 capsule, Rfl: 6 .  omeprazole (PRILOSEC) 40 MG capsule, Take 1 capsule (40 mg total) by mouth daily., Disp: 90 capsule, Rfl: 3 .  tamoxifen (NOLVADEX) 20 MG tablet, Take 1 tablet (20 mg total) by mouth daily., Disp: 90 tablet, Rfl: 4 .  thyroid (NP THYROID) 30 MG tablet, TAKE 1 & 1/2 (ONE & ONE-HALF) TABLETS BY MOUTH ONCE DAILY BEFORE BREAKFAST, Disp: 135 tablet, Rfl: 3  EXAM: This was a telehealth telephone visit and thus no physical exam was completed.  ASSESSMENT AND PLAN:  Discussed the following assessment and plan:  GERD (gastroesophageal reflux disease) Asymptomatic.  She will continue omeprazole.  Hypothyroidism Continue NP thyroid.  Plan for labs in 3 months.  Prediabetes Encouraged adding in exercise and decreasing sweets and junk food.  Check A1c in 3 months.   I discussed the COVID-19 vaccine with the patient.  I discussed that it seems to be safe based on studies that have been completed.  Discussed that it is not 100% effective based on study results and that there would still be a chance that she could get Covid even though she gets the vaccine.  Discussed potential side effects such as aches, fever, chills, and headache.  Discussed that these typically last 1 to 2 days after the vaccine.  Discussed that she is at risk given her she works.  Advised that even if she gets the vaccine she needs to still continue to practice good mask wearing and social distancing protocols.   No orders of the defined types were  placed in this encounter.   Meds ordered this encounter  Medications  . thyroid (NP THYROID) 30 MG tablet    Sig: TAKE 1 & 1/2 (ONE & ONE-HALF) TABLETS BY MOUTH ONCE DAILY BEFORE BREAKFAST    Dispense:  135 tablet    Refill:  3     I discussed the assessment and treatment plan with the patient. The patient was provided an opportunity to ask questions and all were answered. The patient agreed with the plan and demonstrated an understanding of the instructions.   The patient was advised to call back or seek an in-person evaluation if the symptoms worsen or if the condition fails to improve as anticipated.  I provided 14 minutes of non-face-to-face time during this encounter.   Tommi Rumps, MD

## 2019-03-26 NOTE — Assessment & Plan Note (Signed)
Encouraged adding in exercise and decreasing sweets and junk food.  Check A1c in 3 months.

## 2019-03-26 NOTE — Assessment & Plan Note (Signed)
Asymptomatic.  She will continue omeprazole.

## 2019-03-26 NOTE — Telephone Encounter (Signed)
Lvm to set up labs for 3 months and 6 months with Dr Caryl Bis.

## 2019-03-26 NOTE — Assessment & Plan Note (Signed)
Continue NP thyroid.  Plan for labs in 3 months.

## 2019-06-27 ENCOUNTER — Other Ambulatory Visit (INDEPENDENT_AMBULATORY_CARE_PROVIDER_SITE_OTHER): Payer: Commercial Managed Care - PPO

## 2019-06-27 ENCOUNTER — Other Ambulatory Visit: Payer: Self-pay

## 2019-06-27 DIAGNOSIS — R7303 Prediabetes: Secondary | ICD-10-CM

## 2019-06-27 DIAGNOSIS — E039 Hypothyroidism, unspecified: Secondary | ICD-10-CM

## 2019-06-27 LAB — HEMOGLOBIN A1C: Hgb A1c MFr Bld: 6.3 % (ref 4.6–6.5)

## 2019-06-27 LAB — TSH: TSH: 3.02 u[IU]/mL (ref 0.35–4.50)

## 2019-07-23 ENCOUNTER — Other Ambulatory Visit: Payer: Self-pay | Admitting: Oncology

## 2019-07-23 DIAGNOSIS — Z1231 Encounter for screening mammogram for malignant neoplasm of breast: Secondary | ICD-10-CM

## 2019-08-06 ENCOUNTER — Ambulatory Visit
Admission: RE | Admit: 2019-08-06 | Discharge: 2019-08-06 | Disposition: A | Discharge status: Home / Self Care | Payer: Commercial Managed Care - PPO | Source: Ambulatory Visit | Attending: Oncology | Admitting: Oncology

## 2019-08-06 ENCOUNTER — Other Ambulatory Visit: Payer: Self-pay

## 2019-08-06 DIAGNOSIS — Z1231 Encounter for screening mammogram for malignant neoplasm of breast: Secondary | ICD-10-CM

## 2019-09-24 ENCOUNTER — Ambulatory Visit (INDEPENDENT_AMBULATORY_CARE_PROVIDER_SITE_OTHER): Payer: Commercial Managed Care - PPO | Admitting: Family Medicine

## 2019-09-24 ENCOUNTER — Other Ambulatory Visit: Payer: Self-pay

## 2019-09-24 ENCOUNTER — Other Ambulatory Visit: Payer: Self-pay | Admitting: Family Medicine

## 2019-09-24 ENCOUNTER — Encounter: Payer: Self-pay | Admitting: Family Medicine

## 2019-09-24 ENCOUNTER — Ambulatory Visit (INDEPENDENT_AMBULATORY_CARE_PROVIDER_SITE_OTHER): Payer: Commercial Managed Care - PPO

## 2019-09-24 VITALS — BP 120/78 | HR 60 | Temp 98.1°F | Ht 64.0 in | Wt 160.4 lb

## 2019-09-24 DIAGNOSIS — E785 Hyperlipidemia, unspecified: Secondary | ICD-10-CM | POA: Diagnosis not present

## 2019-09-24 DIAGNOSIS — Z1159 Encounter for screening for other viral diseases: Secondary | ICD-10-CM

## 2019-09-24 DIAGNOSIS — M25541 Pain in joints of right hand: Secondary | ICD-10-CM

## 2019-09-24 DIAGNOSIS — M25542 Pain in joints of left hand: Secondary | ICD-10-CM

## 2019-09-24 DIAGNOSIS — Z0001 Encounter for general adult medical examination with abnormal findings: Secondary | ICD-10-CM | POA: Diagnosis not present

## 2019-09-24 DIAGNOSIS — E039 Hypothyroidism, unspecified: Secondary | ICD-10-CM | POA: Diagnosis not present

## 2019-09-24 LAB — COMPREHENSIVE METABOLIC PANEL
ALT: 12 U/L (ref 0–35)
AST: 13 U/L (ref 0–37)
Albumin: 3.8 g/dL (ref 3.5–5.2)
Alkaline Phosphatase: 62 U/L (ref 39–117)
BUN: 12 mg/dL (ref 6–23)
CO2: 22 mEq/L (ref 19–32)
Calcium: 8.8 mg/dL (ref 8.4–10.5)
Chloride: 106 mEq/L (ref 96–112)
Creatinine, Ser: 0.74 mg/dL (ref 0.40–1.20)
GFR: 81.34 mL/min (ref >60.00)
Glucose, Bld: 113 mg/dL — ABNORMAL HIGH (ref 70–99)
Potassium: 3.9 mEq/L (ref 3.5–5.1)
Sodium: 140 mEq/L (ref 135–145)
Total Bilirubin: 0.3 mg/dL (ref 0.2–1.2)
Total Protein: 6.6 g/dL (ref 6.0–8.3)

## 2019-09-24 LAB — LIPID PANEL
Cholesterol: 256 mg/dL — ABNORMAL HIGH (ref 0–200)
HDL: 57.2 mg/dL (ref >39.00)
LDL Cholesterol: 160 mg/dL — ABNORMAL HIGH (ref 0–99)
NonHDL: 199.28
Total CHOL/HDL Ratio: 4
Triglycerides: 195 mg/dL — ABNORMAL HIGH (ref 0.0–149.0)
VLDL: 39 mg/dL (ref 0.0–40.0)

## 2019-09-24 LAB — TSH: TSH: 3.08 u[IU]/mL (ref 0.35–4.50)

## 2019-09-24 MED ORDER — THYROID 30 MG PO TABS: ORAL_TABLET | ORAL | 3 refills | Status: DC

## 2019-09-24 NOTE — Assessment & Plan Note (Signed)
Physical exam completed.  Encouraged adding in exercising such as walking.  Discussed decreasing sweets and minimizing fried foods.  Pelvic exam completed today.  Discussed Pap smear guidelines recommend Pap smear every 5 years with negative HPV and NIL findings.  Plan for Pap smear in 2 years when she is due.  We will request her colonoscopy report.  She will continue to see her oncologist for breast exams.  Advised to contact us immediately if she has any vaginal bleeding.  Encouraged her to get her flu vaccine when it is available.  Hepatitis C screening ordered.  Lab work as outlined below.

## 2019-09-24 NOTE — Patient Instructions (Signed)
Nice to see you. We will check lab work today and contact you with the results. Please add in some exercise several days a week. Please try to minimize fried food and decrease your sweet intake. Please get your flu vaccine when it is available. We will let you know what your x-rays show as well.

## 2019-09-24 NOTE — Progress Notes (Signed)
Tommi Rumps, MD Phone: [phone]  Erica Woods is a 54 y.o. female who presents today for CPE.  Diet: Normal diet with fruits and vegetables.  Eats fried foods once a week.  No soda or sweet tea.  She is cut down on her sweets. Exercise: Not exercising Pap smear: 05/04/2016.  NIL negative. Colonoscopy: 10/23/2014.  Requesting records for the report. Mammogram: 08/06/2019.  Follows with oncology. Family history-  Colon cancer: No  Breast cancer: No  Ovarian cancer: No  Cervical cancer: Sister Menses: Postmenopausal with no bleeding Vaccines-   Flu: She will get this when it is available.  Tetanus: Up-to-date  Shingles: Up-to-date  COVID19: Up-to-date HIV screening: Up-to-date Hep C Screening: Due Tobacco use: No Alcohol use: No Illicit Drug use: No Dentist: Yes Ophthalmology: Yes  Joint pains: Patient notes scattered joint pains for a number of months now.  Notes aching discomfort in her fingers, knees, and ankles.  Notes her hands bother her the most with tight and swollen joints in her fingers.  She does have psoriasis and is not on any disease modifying therapy.   Active Ambulatory Problems    Diagnosis Date Noted  . 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 Migraines 11/25/2014  . GERD (gastroesophageal reflux disease) 11/25/2014  . Encounter for general adult medical examination with abnormal findings 05/02/2015  . 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 Arthralgia of both hands 05/04/2016  . Hypothyroidism 11/08/2016  . Muscle cramps 11/08/2016  . Plantar fasciitis, left 07/05/2016  . Psoriasis (a type of skin inflammation) 07/05/2016  . Hot flashes 07/29/2017  . Hyperlipidemia 07/29/2017  . Stress 11/14/2017  . Prediabetes 09/20/2018  . Pain of right hand 09/20/2018   Resolved Ambulatory Problems    Diagnosis Date Noted  . Neoplasm of breast, 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)  11/25/2014  . Sinusitis, acute frontal 01/28/2016  . Dysuria 10/08/2016   Past Medical History:  Diagnosis Date  . Allergic rhinitis   . Breast cancer ([place])   . Chickenpox   . Headache   . Hemorrhoids   . History of blood transfusion   . Hypercholesteremia   . Hyperthyroidism     Family History  Problem Relation Age of Onset  . Heart attack Father   . Hypertension Other   . Diabetes Other     Social History   Socioeconomic History  . Marital status: Married    Spouse name: Not on file  . Number of children: Not on file  . Years of education: Not on file  . Highest education level: Not on file  Occupational History  . Not on file  Tobacco Use  . Smoking status: Never Smoker  . Smokeless tobacco: Never Used  Vaping Use  . Vaping Use: Never used  Substance and Sexual Activity  . Alcohol use: No  . Drug use: No  . Sexual activity: Not on file  Other Topics Concern  . Not on file  Social History Narrative  . Not on file   Social Determinants of Health   Financial Resource Strain:   . Difficulty of Paying Living Expenses:   Food Insecurity:   . Worried About Charity fundraiser in the Last Year:   . Arboriculturist in the Last Year:   Transportation Needs:   . Film/video editor (Medical):   Marland Kitchen Lack of Transportation (Non-Medical):   Physical Activity:   .  Days of Exercise per Week:   . Minutes of Exercise per Session:   Stress:   . Feeling of Stress :   Social Connections:   . Frequency of Communication with Friends and Family:   . Frequency of Social Gatherings with Friends and Family:   . Attends Religious Services:   . Active Member of Clubs or Organizations:   . Attends Archivist Meetings:   Marland Kitchen Marital Status:   Intimate Partner Violence:   . Fear of Current or Ex-Partner:   . Emotionally Abused:   Marland Kitchen Physically Abused:   . Sexually Abused:     ROS  General:  Negative for nexplained weight loss, fever Skin: Negative for new or  changing mole, sore that won't heal HEENT: Negative for trouble hearing, trouble seeing, ringing in ears, mouth sores, hoarseness, change in voice, dysphagia. CV:  Negative for chest pain, dyspnea, edema, palpitations Resp: Negative for cough, dyspnea, hemoptysis GI: Negative for nausea, vomiting, diarrhea, constipation, abdominal pain, melena, hematochezia. GU: Negative for dysuria, incontinence, urinary hesitance, hematuria, vaginal or penile discharge, polyuria, sexual difficulty, lumps in testicle or breasts MSK: Positive for joint pain and joint swelling in her hands, negative for muscle cramps or aches Neuro: Negative for headaches, weakness, numbness, dizziness, passing out/fainting Psych: Negative for depression, anxiety, memory problems  Objective  Physical Exam Vitals:   09/24/19 0852  BP: 120/78  Pulse: 60  Temp: 98.1 F (36.7 C)  SpO2: 99%    BP Readings from Last 3 Encounters:  09/24/19 120/78  11/29/18 (!) 108/45  09/20/18 110/70   Wt Readings from Last 3 Encounters:  09/24/19 160 lb 6.4 oz (72.8 kg)  03/26/19 154 lb (69.9 kg)  11/29/18 152 lb 1.6 oz (69 kg)    Physical Exam Constitutional:      General: She is not in acute distress.    Appearance: She is not diaphoretic.  HENT:     Head: Normocephalic and atraumatic.  Eyes:     Conjunctiva/sclera: Conjunctivae normal.     Pupils: Pupils are equal, round, and reactive to light.  Cardiovascular:     Rate and Rhythm: Normal rate and regular rhythm.     Heart sounds: Normal heart sounds.  Pulmonary:     Effort: Pulmonary effort is normal.     Breath sounds: Normal breath sounds.  Abdominal:     General: Bowel sounds are normal. There is no distension.     Palpations: Abdomen is soft.     Tenderness: There is no abdominal tenderness. There is no guarding or rebound.  Genitourinary:    Comments: Sherrilee Gilles, CMA served as chaperone, normal labia, normal vaginal opening, normal urethral opening, normal  vaginal mucosa, normal cervix, no cervical motion tenderness, no adnexal masses or tenderness, uterus midline freely mobile Musculoskeletal:     Right lower leg: No edema.     Left lower leg: No edema.     Comments: Bilateral hands with mild finger swelling and tenderness in DIP and PIP joints  Skin:    General: Skin is warm and dry.     Comments: Psoriatic plaque on right volar wrist  Neurological:     Mental Status: She is alert.  Psychiatric:        Mood and Affect: Mood normal.      Assessment/Plan:   Encounter for general adult medical examination with abnormal findings Physical exam completed.  Encouraged adding in exercising such as walking.  Discussed decreasing sweets and minimizing fried foods.  Pelvic exam completed today.  Discussed Pap smear guidelines recommend Pap smear every 5 years with negative HPV and NIL findings.  Plan for Pap smear in 2 years when she is due.  We will request her colonoscopy report.  She will continue to see her oncologist for breast exams.  Advised to contact us immediately if she has any vaginal bleeding.  Encouraged her to get her flu vaccine when it is available.  Hepatitis C screening ordered.  Lab work as outlined below.  Arthralgia of both hands Bilateral hand pain.  We will get x-rays of both hands given her history of psoriasis and given that she has had worsening discomfort.  Prior x-ray indicated mild osteoarthritis and that may be the cause though I do feel we need to evaluate for psoriatic arthritis with imaging.   Orders Placed This Encounter  Procedures  . DG Hand Complete Right    Standing Status:   Future    Number of Occurrences:   1    Standing Expiration Date:   09/23/2020    Order Specific Question:   Reason for Exam (SYMPTOM  OR DIAGNOSIS REQUIRED)    Answer:   bilateral hand pain with joint tenderness in her fingers and joint swelling in her fingers    Order Specific Question:   Is patient pregnant?    Answer:   No     Order Specific Question:   Preferred imaging location?    Answer:   [facility] Specific Question:   Radiology Contrast Protocol - do NOT remove file path    Answer:   \\[email or web address]  . DG Hand Complete Left    Standing Status:   Future    Number of Occurrences:   1    Standing Expiration Date:   09/23/2020    Order Specific Question:   Reason for Exam (SYMPTOM  OR DIAGNOSIS REQUIRED)    Answer:   bilateral hand pain with joint tenderness in her fingers and joint swelling in her fingers    Order Specific Question:   Is patient pregnant?    Answer:   No    Order Specific Question:   Preferred imaging location?    Answer:   [facility] Specific Question:   Radiology Contrast Protocol - do NOT remove file path    Answer:   \\[email or web address]s.pdf  . Comp Met (CMET)  . Lipid panel  . Rheumatoid Factor  . Cyclic citrul peptide antibody, IgG ([facility])  . Antinuclear Antib (ANA)  . Hepatitis C Antibody  . TSH    Meds ordered this encounter  Medications  . thyroid (NP THYROID) 30 MG tablet    Sig: TAKE 1 & 1/2 (ONE & ONE-HALF) TABLETS BY MOUTH ONCE DAILY BEFORE BREAKFAST    Dispense:  135 tablet    Refill:  3    This visit occurred during the SARS-CoV-2 public health emergency.  Safety protocols were in place, including screening questions prior to the visit, additional usage of staff PPE, and extensive cleaning of exam room while observing appropriate contact time as indicated for disinfecting solutions.    Tommi Rumps, MD [place]

## 2019-09-24 NOTE — Assessment & Plan Note (Signed)
Bilateral hand pain.  We will get x-rays of both hands given her history of psoriasis and given that she has had worsening discomfort.  Prior x-ray indicated mild osteoarthritis and that may be the cause though I do feel we need to evaluate for psoriatic arthritis with imaging.

## 2019-09-26 ENCOUNTER — Other Ambulatory Visit: Payer: Self-pay | Admitting: Family Medicine

## 2019-09-26 DIAGNOSIS — Z5181 Encounter for therapeutic drug level monitoring: Secondary | ICD-10-CM

## 2019-09-26 LAB — ANTI-NUCLEAR AB-TITER (ANA TITER): ANA Titer 1: 1:40 {titer} — ABNORMAL HIGH

## 2019-09-26 LAB — CYCLIC CITRUL PEPTIDE ANTIBODY, IGG: Cyclic Citrullin Peptide Ab: <16 UNITS

## 2019-09-26 LAB — ANA: Anti Nuclear Antibody (ANA): POSITIVE — AB

## 2019-09-26 LAB — RHEUMATOID FACTOR: Rhuematoid fact SerPl-aCnc: <14 IU/mL (ref <14)

## 2019-09-26 LAB — HEPATITIS C ANTIBODY
Hepatitis C Ab: NONREACTIVE
SIGNAL TO CUT-OFF: 0.01 (ref <1.00)

## 2019-09-26 MED ORDER — MELOXICAM 7.5 MG PO TABS: 7.5000 mg | ORAL_TABLET | Freq: Every day | ORAL | 0 refills | Status: DC

## 2019-09-27 ENCOUNTER — Telehealth: Payer: Self-pay

## 2019-09-27 NOTE — Telephone Encounter (Signed)
-----   Message from Leone Haven, MD sent at 09/27/2019  8:20 AM EDT ----- Please let the patient know that her ANA are as positive. This is a nonspecific finding that could indicate several possible causes for her joint pain. I would like to refer her to rheumatology.

## 2019-10-03 ENCOUNTER — Other Ambulatory Visit: Payer: Self-pay | Admitting: Family Medicine

## 2019-10-03 DIAGNOSIS — R768 Other specified abnormal immunological findings in serum: Secondary | ICD-10-CM

## 2019-10-12 ENCOUNTER — Other Ambulatory Visit (INDEPENDENT_AMBULATORY_CARE_PROVIDER_SITE_OTHER): Payer: Commercial Managed Care - PPO

## 2019-10-12 ENCOUNTER — Other Ambulatory Visit: Payer: Self-pay

## 2019-10-12 DIAGNOSIS — Z5181 Encounter for therapeutic drug level monitoring: Secondary | ICD-10-CM

## 2019-10-12 LAB — BASIC METABOLIC PANEL
BUN: 18 mg/dL (ref 6–23)
CO2: 29 mEq/L (ref 19–32)
Calcium: 9.3 mg/dL (ref 8.4–10.5)
Chloride: 105 mEq/L (ref 96–112)
Creatinine, Ser: 0.75 mg/dL (ref 0.40–1.20)
GFR: 80.08 mL/min (ref >60.00)
Glucose, Bld: 118 mg/dL — ABNORMAL HIGH (ref 70–99)
Potassium: 4.5 mEq/L (ref 3.5–5.1)
Sodium: 142 mEq/L (ref 135–145)

## 2019-11-28 ENCOUNTER — Other Ambulatory Visit: Payer: Self-pay

## 2019-11-28 DIAGNOSIS — D0502 Lobular carcinoma in situ of left breast: Secondary | ICD-10-CM

## 2019-11-28 NOTE — Progress Notes (Signed)
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  She has minimal vaginal discharge, no significant hot flashes.  Since her last visit here, she underwent a digital screening bilateral mammogram with tomography on 08/06/2019 showing: Breast Density Category C; no mammographic evidence of malignancy.    REVIEW OF SYSTEMS: [name] had both doses of the COVID-19 vaccine, with significant reactions to the shots and then she got Covid herself early September 2021.  Her husband and his whole family had.  [name] had problems with nausea headache cough and temperature but she did not lose her sense of smell.  She is now feeling pretty much back to normal.  A detailed review of systems was otherwise noncontributory   BREAST CANCER HISTORY: From the earlier intake note  [name] [facility] routine [facility]. This showed in the left breast some suspicious calcifications, and on 08/27/2014 the patient underwent digital left diagnostic mammography. Breast density was category C. The study showed 2 adjacent groups of pleomorphic calcifications one measuring 8 mm at the 12:30 o'clock position and another one 0.4 mm in the central left breast. The distance between these groups was 4.5 cm. There were no other findings of concern.  On 09/03/2014 [name] underwent biopsy of the 2 left breast areas in question. The larger area at 12:30  o'clock showed atypical ductal hyperplasia as did the separate more central left breast lesion ([id number]).  The patient was then referred to Dr. [name] and after appropriate discussion he proceeded to needle localization excisional biopsies 2. The result of this "double lumpectomy" on 09/03/2014 showed ([place] [id number]) at the larger lesion, at 12:30 o'clock, marked with an X shaped clip, a benign complex sclerosing lesion/radial scar associated with lobular carcinoma in situ. In the more central, smaller lesion, marked with a cylinder shaped clip, there was again atypical lobular hyperplasia and lobular carcinoma in situ..  Because of these concerns, on 10/18/2014 the patient underwent bilateral breast MRIs. The right breast showed scattered benign appearing enhancing foci. In the left breast there was a seroma involving both the upper outer and lower outer quadrant measuring up to 5.4 cm. There were no other areas of concern and no abnormal adenopathy or other ancillary findings. Because of both the right breast and left breast findings a repeat MRI of the breast in 6 months was recommended.  The patient's subsequent history is as detailed below   PAST MEDICAL HISTORY: Past Medical History:  Diagnosis Date  . Allergic rhinitis   . Breast cancer ([place])   . Chickenpox   . GERD (gastroesophageal reflux disease)   . Headache   . Hemorrhoids   . History of blood transfusion   . Hypercholesteremia   . Hyperthyroidism     PAST SURGICAL HISTORY: Past Surgical History:  Procedure Laterality Date  . ABDOMINAL SURGERY    . APPENDECTOMY    . BALLOON DILATION N/A 02/04/2017   Procedure: BALLOON DILATION;  Surgeon: [name], MD;  Location:  [facility] ENDOSCOPY;  Service: Gastroenterology;  Laterality: N/A;  . BREAST BIOPSY Left 10/02/2014   Procedure: LEFT BREAST BIOPSY AFTER NEEDLE LOCALIZATION X TWO;  Surgeon: [name], MD;  Location: [facility]S;  Service: General;  Laterality: Left;  . BREAST  LUMPECTOMY  2016   benign  . COLONOSCOPY N/A 10/23/2014   Procedure: COLONOSCOPY;  Surgeon: Rogene Houston, MD;  Location: [facility];  Service: Endoscopy;  Laterality: N/A;  730  . ESOPHAGOGASTRODUODENOSCOPY (EGD) WITH PROPOFOL N/A 02/04/2017   Procedure: ESOPHAGOGASTRODUODENOSCOPY (EGD) WITH PROPOFOL;  Surgeon: [name], MD;  Location: [facility] ENDOSCOPY;  Service: Gastroenterology;  Laterality: N/A;  . LAPAROSCOPIC APPENDECTOMY  03/02/2012   Procedure: APPENDECTOMY LAPAROSCOPIC;  Surgeon: [name], MD;  Location: [facility];  Service: General;  Laterality: N/A;  . OVARIAN CYST REMOVAL Left   . TUBAL LIGATION    . WISDOM TOOTH EXTRACTION Bilateral     FAMILY HISTORY Family History  Problem Relation Age of Onset  . Heart attack Father   . Hypertension Other   . Diabetes Other   the patient's father died from a heart attack at age 23. The patient's mother is living, at age 25. The patient had 1 full brother and one full sister, then 2 half sisters. The full sister was diagnosed with either cancer of the uterus or perhaps more likely cancer of the cervix in her 14s (the patient tells me that her sister had 2 babies after being diagnosed, so uterine cancer appears unlikely). There is no other history of breast or ovarian cancer in the family and no history of colon cancer in the family   GYNECOLOGIC HISTORY:  Patient's last menstrual period was 10/09/2014. Menarche age 79, first live birth age 86. She is GX P2. She is still having regular periods, most recently late August. ("This one is a little late". 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 Currently she is doing third shift 8 hours 5 days a week. She also sits sometimes for private clients. Her husband [name] worked for the Technical sales engineer chiefly on [place]. Son [name] in  [name] is a Metallurgist, doing body work, and son [name] in [place] is a Health and safety inspector. The patient has no grandchildren. She attends a local [employer]: not in place; the patient is aware that her husband would be her healthcare power of attorney by default in casesomething happened to her and she is comfortable with that   HEALTH MAINTENANCE: Social History   Tobacco Use  . Smoking status: Never Smoker  . Smokeless tobacco: Never Used  Vaping Use  . Vaping Use: Never used  Substance Use Topics  . Alcohol use: No  . Drug use: No     Colonoscopy: 10/23/2014; unremarkable  PAP:06/21/2014  Bone density:never  Lipid panel:  Allergies  Allergen Reactions  . Gold-Containing Drug Products     Allergic to GOLD     Current Outpatient Medications  Medication Sig Dispense Refill  . aspirin-acetaminophen-caffeine (EXCEDRIN MIGRAINE) 250-250-65 MG tablet Take 1 tablet by mouth every 6 (six) hours as needed for headache. 30 tablet 0  . gabapentin (NEURONTIN) 100 MG capsule Take 1-2 capsules (100-200 mg total) by mouth at bedtime. 120 capsule 6  . meloxicam (MOBIC) 7.5 MG tablet Take 1 tablet (7.5 mg total) by mouth daily. 30 tablet 0  . omeprazole (  PRILOSEC) 40 MG capsule Take 1 capsule (40 mg total) by mouth daily. 90 capsule 3  . tamoxifen (NOLVADEX) 20 MG tablet Take 1 tablet (20 mg total) by mouth daily. Stop tamoxifen February 2022 90 tablet 1  . thyroid (NP THYROID) 30 MG tablet TAKE 1 & 1/2 (ONE & ONE-HALF) TABLETS BY MOUTH ONCE DAILY BEFORE BREAKFAST 135 tablet 3   No current facility-administered medications for this visit.    OBJECTIVE: White woman who appears well  Vitals:   11/29/19 0930  BP: (!) 130/57  Pulse: 65  Resp: 18  Temp: 97.6 F (36.4 C)  SpO2: 98%   Wt Readings from Last 3 Encounters:  11/29/19 156 lb 6.4 oz (70.9 kg)  09/24/19 160 lb 6.4 oz (72.8 kg)  03/26/19 154 lb (69.9 kg)   Body mass index is 26.85  kg/m.    ECOG FS:1 - Symptomatic but completely ambulatory  Sclerae unicteric, EOMs intact Wearing a mask No cervical or supraclavicular adenopathy Lungs no rales or rhonchi Heart regular rate and rhythm Abd soft, nontender, positive bowel sounds MSK no focal spinal tenderness, no upper extremity lymphedema Neuro: nonfocal, well oriented, appropriate affect Breasts: The right breast is unremarkable.  The left breast is status post lumpectomy with no suspicious findings.  Both axillae are benign.   LAB RESULTS:  CMP     Component Value Date/Time   NA 144 11/29/2019 0843   NA 141 11/22/2016 1228   K 4.3 11/29/2019 0843   K 4.2 11/22/2016 1228   CL 108 11/29/2019 0843   CO2 26 11/29/2019 0843   CO2 28 11/22/2016 1228   GLUCOSE 114 (H) 11/29/2019 0843   GLUCOSE 97 11/22/2016 1228   BUN 16 11/29/2019 0843   BUN 9.1 11/22/2016 1228   CREATININE 0.77 11/29/2019 0843   CREATININE 0.8 11/22/2016 1228   CALCIUM 9.4 11/29/2019 0843   CALCIUM 9.4 11/22/2016 1228   PROT 7.0 11/29/2019 0843   PROT 7.1 11/22/2016 1228   ALBUMIN 3.7 11/29/2019 0843   ALBUMIN 3.5 11/22/2016 1228   AST 19 11/29/2019 0843   AST 23 11/22/2016 1228   ALT 19 11/29/2019 0843   ALT 27 11/22/2016 1228   ALKPHOS 65 11/29/2019 0843   ALKPHOS 69 11/22/2016 1228   BILITOT 0.3 11/29/2019 0843   BILITOT 0.42 11/22/2016 1228   GFRNONAA >60 11/29/2019 0843   GFRAA >60 11/29/2018 0910    INo results found for: SPEP, UPEP  Lab Results  Component Value Date   WBC 5.7 11/29/2019   NEUTROABS 3.0 11/29/2019   HGB 12.8 11/29/2019   HCT 38.5 11/29/2019   MCV 93.2 11/29/2019   PLT 239 11/29/2019      Chemistry      Component Value Date/Time   NA 144 11/29/2019 0843   NA 141 11/22/2016 1228   K 4.3 11/29/2019 0843   K 4.2 11/22/2016 1228   CL 108 11/29/2019 0843   CO2 26 11/29/2019 0843   CO2 28 11/22/2016 1228   BUN 16 11/29/2019 0843   BUN 9.1 11/22/2016 1228   CREATININE 0.77 11/29/2019 0843    CREATININE 0.8 11/22/2016 1228      Component Value Date/Time   CALCIUM 9.4 11/29/2019 0843   CALCIUM 9.4 11/22/2016 1228   ALKPHOS 65 11/29/2019 0843   ALKPHOS 69 11/22/2016 1228   AST 19 11/29/2019 0843   AST 23 11/22/2016 1228   ALT 19 11/29/2019 0843   ALT 27 11/22/2016 1228   BILITOT 0.3 11/29/2019 0843  BILITOT 0.42 11/22/2016 1228       No results found for: LABCA2  No components found for: ZOXWR604  No results for input(s): INR in the last 168 hours.  Urinalysis    Component Value Date/Time   COLORURINE YELLOW 03/02/2012 1740   APPEARANCEUR CLEAR 03/02/2012 1740   LABSPEC <1.005 (L) 03/02/2012 1740   PHURINE 6.5 03/02/2012 1740   GLUCOSEU NEGATIVE 03/02/2012 1740   HGBUR MODERATE (A) 03/02/2012 1740   BILIRUBINUR neg 10/08/2016 1009   KETONESUR NEGATIVE 03/02/2012 1740   PROTEINUR neg 10/08/2016 1009   PROTEINUR NEGATIVE 03/02/2012 1740   UROBILINOGEN 0.2 10/08/2016 1009   UROBILINOGEN 1.0 03/02/2012 1740   NITRITE neg 10/08/2016 1009   NITRITE NEGATIVE 03/02/2012 1740   LEUKOCYTESUR Negative 10/08/2016 1009    STUDIES: No results found.   ASSESSMENT: 54 y.o. [place], [place] woman status post core needle biopsy 2 on 09/03/2014, both areas showing atypical ductal hyperplasia  (1) s/p Left breast excisional biopsy x2 on 10/02/2014 for a complex sclerosing lesion/radial scar associated with atypical lobular hyperplasia and lobular carcinoma is situ, and a separate area showing also lobular carcinoma in situ  (2) on prophylactic tamoxifen as of March 2017, completing 5 years February 2022   PLAN:  Erica Woods is now a little over 5 years out from definitive surgery for her lobular carcinoma in situ.  She will complete 5 years of tamoxifen February 2022.  By taking this drug for 5 years she has cut her risk of breast cancer in the future in half.  We generally do not continue tamoxifen beyond 5 years when it is given prophylactically so I am very comfortable  releasing her from visits here at this time.  All she will need in terms of breast cancer follow-up is her yearly mammogram and a yearly physician breast exam.  I will be glad to see Joss again at any point in the future if and when the need arises but as of now are making no further routine appointment for her here  Total encounter time 25 minutes.*  Ashyah Quizon, Virgie Dad, MD  11/29/19 9:44 AM Medical Oncology and Hematology [facility] [place], [place] [address] Tel. [phone]    Fax. [phone]   I, Wilburn Mylar, am acting as scribe for Dr. Virgie Dad. Trason Shifflet.  I, Lurline Del MD, have reviewed the above documentation for accuracy and completeness, and I agree with the above.    *Total Encounter Time as defined by the Centers for Medicare and Medicaid Services includes, in addition to the face-to-face time of a patient visit (documented in the note above) non-face-to-face time: obtaining and reviewing outside history, ordering and reviewing medications, tests or procedures, care coordination (communications with other health care professionals or caregivers) and documentation in the medical record.

## 2019-11-29 ENCOUNTER — Other Ambulatory Visit: Payer: Self-pay

## 2019-11-29 ENCOUNTER — Inpatient Hospital Stay: Payer: Commercial Managed Care - PPO

## 2019-11-29 ENCOUNTER — Inpatient Hospital Stay: Payer: Commercial Managed Care - PPO | Attending: Oncology | Admitting: Oncology

## 2019-11-29 VITALS — BP 130/57 | HR 65 | Temp 97.6°F | Resp 18 | Ht 64.0 in | Wt 156.4 lb

## 2019-11-29 DIAGNOSIS — Z79810 Long term (current) use of selective estrogen receptor modulators (SERMs): Secondary | ICD-10-CM | POA: Insufficient documentation

## 2019-11-29 DIAGNOSIS — D0502 Lobular carcinoma in situ of left breast: Secondary | ICD-10-CM

## 2019-11-29 LAB — CBC WITH DIFFERENTIAL (CANCER CENTER ONLY)
Abs Immature Granulocytes: 0.01 10*3/uL (ref 0.00–0.07)
Basophils Absolute: 0 10*3/uL (ref 0.0–0.1)
Basophils Relative: 1 %
Eosinophils Absolute: 0.1 10*3/uL (ref 0.0–0.5)
Eosinophils Relative: 2 %
HCT: 38.5 % (ref 36.0–46.0)
Hemoglobin: 12.8 g/dL (ref 12.0–15.0)
Immature Granulocytes: 0 %
Lymphocytes Relative: 37 %
Lymphs Abs: 2.1 10*3/uL (ref 0.7–4.0)
MCH: 31 pg (ref 26.0–34.0)
MCHC: 33.2 g/dL (ref 30.0–36.0)
MCV: 93.2 fL (ref 80.0–100.0)
Monocytes Absolute: 0.5 10*3/uL (ref 0.1–1.0)
Monocytes Relative: 8 %
Neutro Abs: 3 10*3/uL (ref 1.7–7.7)
Neutrophils Relative %: 52 %
Platelet Count: 239 10*3/uL (ref 150–400)
RBC: 4.13 MIL/uL (ref 3.87–5.11)
RDW: 12.9 % (ref 11.5–15.5)
WBC Count: 5.7 10*3/uL (ref 4.0–10.5)
nRBC: 0 % (ref 0.0–0.2)

## 2019-11-29 LAB — CMP (CANCER CENTER ONLY)
ALT: 19 U/L (ref 0–44)
AST: 19 U/L (ref 15–41)
Albumin: 3.7 g/dL (ref 3.5–5.0)
Alkaline Phosphatase: 65 U/L (ref 38–126)
Anion gap: 10 (ref 5–15)
BUN: 16 mg/dL (ref 6–20)
CO2: 26 mmol/L (ref 22–32)
Calcium: 9.4 mg/dL (ref 8.9–10.3)
Chloride: 108 mmol/L (ref 98–111)
Creatinine: 0.77 mg/dL (ref 0.44–1.00)
GFR, Estimated: >60 mL/min (ref >60)
Glucose, Bld: 114 mg/dL — ABNORMAL HIGH (ref 70–99)
Potassium: 4.3 mmol/L (ref 3.5–5.1)
Sodium: 144 mmol/L (ref 135–145)
Total Bilirubin: 0.3 mg/dL (ref 0.3–1.2)
Total Protein: 7 g/dL (ref 6.5–8.1)

## 2019-11-29 MED ORDER — TAMOXIFEN CITRATE 20 MG PO TABS: 20.0000 mg | ORAL_TABLET | Freq: Every day | ORAL | 1 refills | Status: DC

## 2019-11-30 ENCOUNTER — Telehealth: Payer: Self-pay | Admitting: Oncology

## 2019-11-30 NOTE — Telephone Encounter (Signed)
No 10/14 los, no changes made to pt schedule  

## 2020-03-07 NOTE — Progress Notes (Addendum)
Office Visit Note  Patient: Erica Woods             Date of Birth: [birth year]           MRN: [id number]             PCP: Leone Haven, MD Referring: Leone Haven, MD Visit Date: 03/19/2020 Occupation: @GUAROCC @  Subjective:  Pain in multiple joints.   History of Present Illness: Erica Woods is a 55 y.o. female seen in consultation per request of her PCP.  According the patient her symptoms of psoriasis started at age 1.  She states initially she used to get steroid injections and then topical agents but everything failed.  She states for the last 10 years she has had lower back pain.  She states the back pain sometimes radiates to her right lower extremity.  She has been experiencing joint pain for the last 6 years.  She describes a stiffness in her hands.  She complains of discomfort in her neck, shoulders, wrist joints, hands, SI joints, knee joints, right ankle and lower back.  She has noticed some swelling in her hands.  She gives history of recurrent plantar fasciitis in her left foot for the last 3 years.  She denies any history of Achilles tendinitis or iritis.  She continues to have psoriasis which she describes over her hands, elbows, feet.  There is no family history of psoriasis or psoriatic arthritis.  Activities of Daily Living:  Patient reports morning stiffness for all day. Patient Reports nocturnal pain.  Difficulty dressing/grooming: Reports Difficulty climbing stairs: Reports Difficulty getting out of chair: Reports Difficulty using hands for taps, buttons, cutlery, and/or writing: Reports  Review of Systems  Constitutional: Positive for fatigue. Negative for night sweats, weight gain and weight loss.  HENT: Positive for mouth dryness. Negative for mouth sores, trouble swallowing, trouble swallowing and nose dryness.   Eyes: Negative for pain, redness, itching, visual disturbance and dryness.  Respiratory: Negative for cough, shortness of breath and  difficulty breathing.   Cardiovascular: Negative for chest pain, palpitations, hypertension, irregular heartbeat and swelling in legs/feet.  Gastrointestinal: Negative for blood in stool, constipation and diarrhea.  Endocrine: Negative for increased urination.  Genitourinary: Negative for difficulty urinating and vaginal dryness.  Musculoskeletal: Positive for arthralgias, joint pain, joint swelling, myalgias, morning stiffness, muscle tenderness and myalgias. Negative for muscle weakness.  Skin: Positive for rash. Negative for color change, hair loss, redness, skin tightness, ulcers and sensitivity to sunlight.  Allergic/Immunologic: Negative for susceptible to infections.  Neurological: Positive for weakness. Negative for dizziness, numbness, headaches, memory loss and night sweats.  Hematological: Negative for bruising/bleeding tendency and swollen glands.  Psychiatric/Behavioral: Negative for depressed mood, confusion and sleep disturbance. The patient is not nervous/anxious.     PMFS History:  Patient Active Problem List   Diagnosis Date Noted  . Prediabetes 09/20/2018  . Pain of right hand 09/20/2018  . Stress 11/14/2017  . Hot flashes 07/29/2017  . Hyperlipidemia 07/29/2017  . Hypothyroidism 11/08/2016  . Muscle cramps 11/08/2016  . Plantar fasciitis, left 07/05/2016  . Psoriasis (a type of skin inflammation) 07/05/2016  . Arthralgia of both hands 05/04/2016  . 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 Encounter for general adult medical examination with abnormal findings 05/02/2015  . Migraines 11/25/2014  . GERD (gastroesophageal reflux disease) 11/25/2014  . 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  Allergic rhinitis   . Breast cancer ([place])   . Chickenpox   . GERD (gastroesophageal reflux disease)   . Headache   . Hemorrhoids   . History of blood transfusion   . Hypercholesteremia    . Hyperthyroidism     Family History  Problem Relation Age of Onset  . Diabetes Mother   . Heart attack Father   . Hypertension Other   . Diabetes Other   . Diabetes Sister   . Cancer Sister   . Tuberculosis Brother   . Irregular heart beat Brother   . Parkinson's disease Sister   . Healthy Son   . Irregular heart beat Son   . Healthy Son    Past Surgical History:  Procedure Laterality Date  . ABDOMINAL SURGERY    . APPENDECTOMY    . BALLOON DILATION N/A 02/04/2017   Procedure: BALLOON DILATION;  Surgeon: [name], MD;  Location: [facility] ENDOSCOPY;  Service: Gastroenterology;  Laterality: N/A;  . BREAST BIOPSY Left 10/02/2014   Procedure: LEFT BREAST BIOPSY AFTER NEEDLE LOCALIZATION X TWO;  Surgeon: [name], MD;  Location: [facility] ORS;  Service: General;  Laterality: Left;  . BREAST LUMPECTOMY  2016   benign  . COLONOSCOPY N/A 10/23/2014   Procedure: COLONOSCOPY;  Surgeon: Rogene Houston, MD;  Location: [facility] ENDO SUITE;  Service: Endoscopy;  Laterality: N/A;  730  . ESOPHAGOGASTRODUODENOSCOPY (EGD) WITH PROPOFOL N/A 02/04/2017   Procedure: ESOPHAGOGASTRODUODENOSCOPY (EGD) WITH PROPOFOL;  Surgeon: [name], MD;  Location: [facility] ENDOSCOPY;  Service: Gastroenterology;  Laterality: N/A;  . LAPAROSCOPIC APPENDECTOMY  03/02/2012   Procedure: APPENDECTOMY LAPAROSCOPIC;  Surgeon: [name], MD;  Location: [facility] ORS;  Service: General;  Laterality: N/A;  . OVARIAN CYST REMOVAL Left   . TUBAL LIGATION    . WISDOM TOOTH EXTRACTION Bilateral    Social History   Social History Narrative  . Not on file   Immunization History  Administered Date(s) Administered  . Influenza,inj,Quad PF,6+ Mos 11/14/2017  . Influenza-Unspecified 11/17/2015, 12/12/2018  . Moderna Sars-Covid-2 Vaccination 06/05/2019, 07/03/2019, 01/18/2020  . Tdap 05/02/2015  . Zoster Recombinat (Shingrix) 05/09/2017, 07/12/2017     Objective: Vital Signs: BP 134/78 (BP Location: Right Arm, Patient Position:  Sitting, Cuff Size: Normal)   Pulse 69   Resp 13   Ht 5\' 3"  (1.6 m)   Wt 154 lb (69.9 kg)   LMP 10/09/2014   BMI 27.28 kg/m    Physical Exam Vitals and nursing note reviewed.  Constitutional:      Appearance: She is well-developed and well-nourished.  HENT:     Head: Normocephalic and atraumatic.  Eyes:     Extraocular Movements: EOM normal.     Conjunctiva/sclera: Conjunctivae normal.  Cardiovascular:     Rate and Rhythm: Normal rate and regular rhythm.     Pulses: Intact distal pulses.     Heart sounds: Normal heart sounds.  Pulmonary:     Effort: Pulmonary effort is normal.     Breath sounds: Normal breath sounds.  Abdominal:     General: Bowel sounds are normal.     Palpations: Abdomen is soft.  Musculoskeletal:     Cervical back: Normal range of motion.  Lymphadenopathy:     Cervical: No cervical adenopathy.  Skin:    General: Skin is warm and dry.     Capillary Refill: Capillary refill takes less than 2 seconds.     Findings: Rash present.     Comments: Dry scales on bilateral hands, bilateral  elbows and feet  Neurological:     Mental Status: She is alert and oriented to person, place, and time.  Psychiatric:        Mood and Affect: Mood and affect normal.        Behavior: Behavior normal.      Musculoskeletal Exam: She has limited range of motion of her cervical spine with discomfort. She is some thoracic kyphosis. She has painful limited range of motion of her lumbar spine. She had tenderness over bilateral SI joints. She has painful range of motion of bilateral shoulders. Elbow joints with good range of motion. She has limited range of bilateral wrist joints with tenderness over bilateral wrist joints. She had synovitis and tenderness over several of her MCPs and PIPs as described below.  CDAI Exam: CDAI Score: 12  Patient Global: 5 mm; Provider Global: 5 mm Swollen: 3 ; Tender: 14  Joint Exam 03/19/2020      Right  Left  Glenohumeral   Tender   Tender   Wrist   Tender   Tender  MCP 2   Tender     MCP 3  Swollen Tender     PIP 3   Tender   Tender  PIP 4   Tender   Tender  DIP 2     Swollen   Lumbar Spine   Tender     Sacroiliac   Tender   Tender  Ankle  Swollen Tender        Investigation: No additional findings.  Imaging: No results found.  Recent Labs: Lab Results  Component Value Date   WBC 5.7 11/29/2019   HGB 12.8 11/29/2019   PLT 239 11/29/2019   NA 144 11/29/2019   K 4.3 11/29/2019   CL 108 11/29/2019   CO2 26 11/29/2019   GLUCOSE 114 (H) 11/29/2019   BUN 16 11/29/2019   CREATININE 0.77 11/29/2019   BILITOT 0.3 11/29/2019   ALKPHOS 65 11/29/2019   AST 19 11/29/2019   ALT 19 11/29/2019   PROT 7.0 11/29/2019   ALBUMIN 3.7 11/29/2019   CALCIUM 9.4 11/29/2019   GFRAA >60 11/29/2018   Hepatitis C negative on September 24, 2019.  Speciality Comments: No specialty comments available.  Procedures:  No procedures performed Allergies: Gold-containing drug products   Assessment / Plan:     Visit Diagnoses: Polyarthralgia-patient complains of pain and discomfort in multiple joints and significant stiffness. She had difficulty getting up from chair coming to the exam table.  Pain in both hands -she had tenderness on palpation of bilateral wrist joints and limited range of motion of her wrist joints. She had incomplete fist formation and tenderness over several of her MCPs and DIPs. Synovitis was noted on the left second MCP joint. Plan: XR Hand 2 View Left, XR Hand 2 View Right, x-ray were consistent with osteoarthritis.  Sedimentation rate  Chronic right SI joint pain -she gives history of chronic SI joint pain for at least 10 years. She had tenderness on palpation. Plan: XR Pelvis 1-2 Views.  No SI joint to sclerosis or narrowing was noted.  Trochanteric bursitis of both hips-she had tenderness on palpation of bilateral trochanteric bursa.  Plantar fasciitis, left-she gives history of recurrent plantar fasciitis for  the last 3 years.  Pain in both feet -she complains of discomfort in her bilateral feet and her the right ankle joint. She had fracture of her right ankle in the past. She has warmth on palpation of her right ankle joint.  Plan: XR Foot 2 Views Right, XR Foot 2 Views Left, plan: XR Ankle 2 Views Right.  X-rays are consistent with osteoarthritis.  No ankle joint arthritis was noted.  Neck pain -she complains of neck pain and stiffness for many years. XR Cervical Spine 2 or 3 views.  Mild facet joint arthropathy was noted.  Chronic midline low back pain with right-sided sciatica -she complains of discomfort in the lower back for the last 10 years. Plan: XR Lumbar Spine 2-3 Views.  Mild facet joint arthropathy was noted.  Psoriasis-she has psoriasis patches on her elbows, bilateral hands and on plantar aspect of her feet.  High risk medication use -in anticipation to start her on immunosuppressive therapy I'll obtain following labs today. Plan: CBC with Differential/Platelet, COMPLETE METABOLIC PANEL WITH GFR, Hepatitis B core antibody, IgM, Hepatitis B surface antigen, HIV Antibody (routine testing w rflx), QuantiFERON-TB Gold Plus, Serum protein electrophoresis with reflex, IgG, IgA, IgM, DG Chest 2 View  Other fatigue -she gives history of increased fatigue. Plan: CK  Positive ANA (antinuclear antibody) - 09/24/19: ANA 1:40NS, TSH 3.08, CCP<16, RF<14, Hep C ab-. ANA is low titer and is not significant. She has no clinical features of lupus.  Other medical problems are listed as follows:  Prediabetes  History of hyperlipidemia  History of gastroesophageal reflux (GERD)  Lobular carcinoma in situ (LCIS) of left breast - 2016, lumpectomy, No CTX or RTX, Tamoxifen x 5 years.  History of hypothyroidism  Hx of migraines  Orders: Orders Placed This Encounter  Procedures  . XR Lumbar Spine 2-3 Views  . XR Pelvis 1-2 Views  . XR Hand 2 View Left  . XR Hand 2 View Right  . XR Ankle 2 Views  Right  . XR Cervical Spine 2 or 3 views  . XR Foot 2 Views Right  . XR Foot 2 Views Left  . DG Chest 2 View  . CBC with Differential/Platelet  . COMPLETE METABOLIC PANEL WITH GFR  . Sedimentation rate  . CK  . Hepatitis B core antibody, IgM  . Hepatitis B surface antigen  . HIV Antibody (routine testing w rflx)  . QuantiFERON-TB Gold Plus  . Serum protein electrophoresis with reflex  . IgG, IgA, IgM   No orders of the defined types were placed in this encounter.     Follow-Up Instructions: Return for Psoriatic arthritis.   Bo Merino, MD  Note - This record has been created using Editor, commissioning.  Chart creation errors have been sought, but may not always  have been located. Such creation errors do not reflect on  the standard of medical care.

## 2020-03-19 ENCOUNTER — Ambulatory Visit: Payer: Self-pay

## 2020-03-19 ENCOUNTER — Ambulatory Visit (HOSPITAL_COMMUNITY)
Admission: RE | Admit: 2020-03-19 | Discharge: 2020-03-19 | Disposition: A | Discharge status: Home / Self Care | Payer: Commercial Managed Care - PPO | Source: Ambulatory Visit | Attending: Rheumatology | Admitting: Rheumatology

## 2020-03-19 ENCOUNTER — Other Ambulatory Visit: Payer: Self-pay

## 2020-03-19 ENCOUNTER — Encounter: Payer: Self-pay | Admitting: Rheumatology

## 2020-03-19 ENCOUNTER — Ambulatory Visit: Payer: Commercial Managed Care - PPO | Admitting: Rheumatology

## 2020-03-19 VITALS — BP 134/78 | HR 69 | Resp 13 | Ht 63.0 in | Wt 154.0 lb

## 2020-03-19 DIAGNOSIS — M722 Plantar fascial fibromatosis: Secondary | ICD-10-CM

## 2020-03-19 DIAGNOSIS — M5441 Lumbago with sciatica, right side: Secondary | ICD-10-CM | POA: Diagnosis not present

## 2020-03-19 DIAGNOSIS — L409 Psoriasis, unspecified: Secondary | ICD-10-CM

## 2020-03-19 DIAGNOSIS — M25571 Pain in right ankle and joints of right foot: Secondary | ICD-10-CM | POA: Diagnosis not present

## 2020-03-19 DIAGNOSIS — M2550 Pain in unspecified joint: Secondary | ICD-10-CM

## 2020-03-19 DIAGNOSIS — R5383 Other fatigue: Secondary | ICD-10-CM

## 2020-03-19 DIAGNOSIS — Z8669 Personal history of other diseases of the nervous system and sense organs: Secondary | ICD-10-CM

## 2020-03-19 DIAGNOSIS — M7062 Trochanteric bursitis, left hip: Secondary | ICD-10-CM

## 2020-03-19 DIAGNOSIS — M7061 Trochanteric bursitis, right hip: Secondary | ICD-10-CM

## 2020-03-19 DIAGNOSIS — M542 Cervicalgia: Secondary | ICD-10-CM

## 2020-03-19 DIAGNOSIS — M79671 Pain in right foot: Secondary | ICD-10-CM

## 2020-03-19 DIAGNOSIS — Z79899 Other long term (current) drug therapy: Secondary | ICD-10-CM | POA: Diagnosis not present

## 2020-03-19 DIAGNOSIS — G8929 Other chronic pain: Secondary | ICD-10-CM | POA: Diagnosis not present

## 2020-03-19 DIAGNOSIS — M533 Sacrococcygeal disorders, not elsewhere classified: Secondary | ICD-10-CM | POA: Diagnosis not present

## 2020-03-19 DIAGNOSIS — Z8719 Personal history of other diseases of the digestive system: Secondary | ICD-10-CM

## 2020-03-19 DIAGNOSIS — M79641 Pain in right hand: Secondary | ICD-10-CM | POA: Diagnosis not present

## 2020-03-19 DIAGNOSIS — D0502 Lobular carcinoma in situ of left breast: Secondary | ICD-10-CM

## 2020-03-19 DIAGNOSIS — M79672 Pain in left foot: Secondary | ICD-10-CM

## 2020-03-19 DIAGNOSIS — Z8639 Personal history of other endocrine, nutritional and metabolic disease: Secondary | ICD-10-CM

## 2020-03-19 DIAGNOSIS — R7303 Prediabetes: Secondary | ICD-10-CM

## 2020-03-19 DIAGNOSIS — R768 Other specified abnormal immunological findings in serum: Secondary | ICD-10-CM

## 2020-03-19 DIAGNOSIS — M79642 Pain in left hand: Secondary | ICD-10-CM

## 2020-03-19 NOTE — Patient Instructions (Signed)
Please go to [facility] or [facility] to have a chest x-ray completed.

## 2020-03-21 LAB — QUANTIFERON-TB GOLD PLUS
Mitogen-NIL: 9.64 IU/mL
NIL: 0.03 IU/mL
QuantiFERON-TB Gold Plus: NEGATIVE
TB1-NIL: 0 IU/mL
TB2-NIL: 0.01 IU/mL

## 2020-03-21 LAB — CBC WITH DIFFERENTIAL/PLATELET
Absolute Monocytes: 497 cells/uL (ref 200–950)
Basophils Absolute: 48 cells/uL (ref 0–200)
Basophils Relative: 0.7 %
Eosinophils Absolute: 235 cells/uL (ref 15–500)
Eosinophils Relative: 3.4 %
HCT: 40.3 % (ref 35.0–45.0)
Hemoglobin: 13.7 g/dL (ref 11.7–15.5)
Lymphs Abs: 2325 cells/uL (ref 850–3900)
MCH: 31.3 pg (ref 27.0–33.0)
MCHC: 34 g/dL (ref 32.0–36.0)
MCV: 92 fL (ref 80.0–100.0)
MPV: 11.8 fL (ref 7.5–12.5)
Monocytes Relative: 7.2 %
Neutro Abs: 3795 cells/uL (ref 1500–7800)
Neutrophils Relative %: 55 %
Platelets: 247 10*3/uL (ref 140–400)
RBC: 4.38 10*6/uL (ref 3.80–5.10)
RDW: 12.9 % (ref 11.0–15.0)
Total Lymphocyte: 33.7 %
WBC: 6.9 10*3/uL (ref 3.8–10.8)

## 2020-03-21 LAB — COMPLETE METABOLIC PANEL WITH GFR
AG Ratio: 1.7 (calc) (ref 1.0–2.5)
ALT: 17 U/L (ref 6–29)
AST: 17 U/L (ref 10–35)
Albumin: 4.3 g/dL (ref 3.6–5.1)
Alkaline phosphatase (APISO): 66 U/L (ref 37–153)
BUN: 14 mg/dL (ref 7–25)
CO2: 24 mmol/L (ref 20–32)
Calcium: 9.6 mg/dL (ref 8.6–10.4)
Chloride: 104 mmol/L (ref 98–110)
Creat: 0.68 mg/dL (ref 0.50–1.05)
GFR, Est African American: 114 mL/min/{1.73_m2} (ref >=60)
GFR, Est Non African American: 98 mL/min/{1.73_m2} (ref >=60)
Globulin: 2.6 g/dL (calc) (ref 1.9–3.7)
Glucose, Bld: 104 mg/dL — ABNORMAL HIGH (ref 65–99)
Potassium: 4.5 mmol/L (ref 3.5–5.3)
Sodium: 141 mmol/L (ref 135–146)
Total Bilirubin: 0.4 mg/dL (ref 0.2–1.2)
Total Protein: 6.9 g/dL (ref 6.1–8.1)

## 2020-03-21 LAB — PROTEIN ELECTROPHORESIS, SERUM, WITH REFLEX
Albumin ELP: 4.1 g/dL (ref 3.8–4.8)
Alpha 1: 0.4 g/dL — ABNORMAL HIGH (ref 0.2–0.3)
Alpha 2: 0.8 g/dL (ref 0.5–0.9)
Beta 2: 0.3 g/dL (ref 0.2–0.5)
Beta Globulin: 0.6 g/dL (ref 0.4–0.6)
Gamma Globulin: 0.9 g/dL (ref 0.8–1.7)
Total Protein: 7 g/dL (ref 6.1–8.1)

## 2020-03-21 LAB — IGG, IGA, IGM
IgG (Immunoglobin G), Serum: 955 mg/dL (ref 600–1640)
IgM, Serum: 197 mg/dL (ref 50–300)
Immunoglobulin A: 40 mg/dL — ABNORMAL LOW (ref 47–310)

## 2020-03-21 LAB — HEPATITIS B SURFACE ANTIGEN: Hepatitis B Surface Ag: NONREACTIVE

## 2020-03-21 LAB — HEPATITIS B CORE ANTIBODY, IGM: Hep B C IgM: NONREACTIVE

## 2020-03-21 LAB — HIV ANTIBODY (ROUTINE TESTING W REFLEX): HIV 1&2 Ab, 4th Generation: NONREACTIVE

## 2020-03-21 LAB — CK: Total CK: 46 U/L (ref 29–143)

## 2020-03-21 LAB — SEDIMENTATION RATE: Sed Rate: 6 mm/h (ref 0–30)

## 2020-03-26 ENCOUNTER — Encounter: Payer: Self-pay | Admitting: Family Medicine

## 2020-03-26 ENCOUNTER — Ambulatory Visit: Payer: Commercial Managed Care - PPO | Admitting: Family Medicine

## 2020-03-26 ENCOUNTER — Other Ambulatory Visit: Payer: Self-pay

## 2020-03-26 DIAGNOSIS — D0502 Lobular carcinoma in situ of left breast: Secondary | ICD-10-CM

## 2020-03-26 DIAGNOSIS — E039 Hypothyroidism, unspecified: Secondary | ICD-10-CM

## 2020-03-26 DIAGNOSIS — R5383 Other fatigue: Secondary | ICD-10-CM | POA: Insufficient documentation

## 2020-03-26 DIAGNOSIS — K219 Gastro-esophageal reflux disease without esophagitis: Secondary | ICD-10-CM

## 2020-03-26 DIAGNOSIS — R7303 Prediabetes: Secondary | ICD-10-CM | POA: Diagnosis not present

## 2020-03-26 DIAGNOSIS — R21 Rash and other nonspecific skin eruption: Secondary | ICD-10-CM

## 2020-03-26 MED ORDER — TRIAMCINOLONE ACETONIDE 0.1 % EX CREA: 1.0000 "application " | TOPICAL_CREAM | Freq: Two times a day (BID) | CUTANEOUS | 0 refills | Status: DC

## 2020-03-26 NOTE — Assessment & Plan Note (Signed)
Undetermined cause.  Will trial triamcinolone ointment to see if that is beneficial.  If not improving she will let us know we can refer to dermatology.

## 2020-03-26 NOTE — Assessment & Plan Note (Signed)
Check TSH.  Continue NP thyroid 30 mg daily.

## 2020-03-26 NOTE — Progress Notes (Signed)
Tommi Rumps, MD Phone: [phone]  Erica Woods is a 55 y.o. female who presents today for f/u.  HYPOTHYROIDISM Disease Monitoring Weight changes: down slightly due to lack of appetite after getting the covid booster  Skin Changes: rash Heat/Cold intolerance: no  Medication Monitoring Compliance:  Taking NP thyroid   Last TSH:   Lab Results  Component Value Date   TSH 3.08 09/24/2019   GERD:   Reflux symptoms: no   Abd pain: no   Blood in stool: no  Dysphagia: no   EGD: 2018 with schatzki ring that was dilated  Medication: omeprazole  Rash: Patient notes this started about a month after getting her booster vaccine.  It is on her left forearm.  It itches.  Its been present for about a month.  She tried hydrocortisone with no benefit.  No medication changes.  No changes in soaps or detergents.  No travel.  No bedbugs at home.  Breast cancer history: Patient previously followed with oncology.  She completed 5 years of oral medication for this and they released her from follow-up.  She would need a yearly breast exam and mammogram in October.  Decreased energy: Patient notes decreased energy level since she got her Covid booster a couple months ago.  She lost her appetite with this and had not been eating very well until recently.  She denies depression and anxiety.  Social History   Tobacco Use  Smoking Status Never Smoker  Smokeless Tobacco Never Used    Current Outpatient Medications on File Prior to Visit  Medication Sig Dispense Refill  . aspirin-acetaminophen-caffeine (EXCEDRIN MIGRAINE) 250-250-65 MG tablet Take 1 tablet by mouth every 6 (six) hours as needed for headache. 30 tablet 0  . omeprazole (PRILOSEC) 40 MG capsule Take 1 capsule (40 mg total) by mouth daily. 90 capsule 3  . thyroid (NP THYROID) 30 MG tablet TAKE 1 & 1/2 (ONE & ONE-HALF) TABLETS BY MOUTH ONCE DAILY BEFORE BREAKFAST 135 tablet 3   No current facility-administered medications on file prior  to visit.     ROS see history of present illness  Objective  Physical Exam Vitals:   03/26/20 0939  BP: 120/80  Pulse: 65  Temp: 97.7 F (36.5 C)  SpO2: 99%    BP Readings from Last 3 Encounters:  03/26/20 120/80  03/19/20 134/78  11/29/19 (!) 130/57   Wt Readings from Last 3 Encounters:  03/26/20 150 lb 12.8 oz (68.4 kg)  03/19/20 154 lb (69.9 kg)  11/29/19 156 lb 6.4 oz (70.9 kg)    Physical Exam Constitutional:      General: She is not in acute distress.    Appearance: She is not diaphoretic.  Cardiovascular:     Rate and Rhythm: Normal rate and regular rhythm.     Heart sounds: Normal heart sounds.  Pulmonary:     Effort: Pulmonary effort is normal.     Breath sounds: Normal breath sounds.  Musculoskeletal:        General: No edema.  Skin:    General: Skin is warm and dry.       Neurological:     Mental Status: She is alert.      Assessment/Plan: Please see individual problem list.  Problem List Items Addressed This Visit    Decreased energy    Possibly related to poor oral intake following her booster vaccine or less likely related to the booster vaccine.  We will check lab work to evaluate for other causes.  Relevant Orders   Comp Met (CMET)   TSH   CBC w/Diff   Vitamin D (25 hydroxy)   B12   GERD (gastroesophageal reflux disease)    Adequate control.  Continue omeprazole 40 mg daily.      Hypothyroidism    Check TSH.  Continue NP thyroid 30 mg daily.      Relevant Orders   TSH   Lobular carcinoma in situ (LCIS) of left breast    We will take over follow-up breast exams and mammograms as she has been released by oncology.  She will follow-up in October for a breast exam and mammogram.      Prediabetes    Check A1c.      Relevant Orders   HgB A1c   Rash    Undetermined cause.  Will trial triamcinolone ointment to see if that is beneficial.  If not improving she will let us know we can refer to dermatology.      Relevant  Medications   triamcinolone (KENALOG) 0.1 %      This visit occurred during the SARS-CoV-2 public health emergency.  Safety protocols were in place, including screening questions prior to the visit, additional usage of staff PPE, and extensive cleaning of exam room while observing appropriate contact time as indicated for disinfecting solutions.    Tommi Rumps, MD [place]

## 2020-03-26 NOTE — Assessment & Plan Note (Addendum)
We will take over follow-up breast exams and mammograms as she has been released by oncology.  She will follow-up in October for a breast exam and mammogram.

## 2020-03-26 NOTE — Assessment & Plan Note (Signed)
Adequate control.  Continue omeprazole 40 mg daily.

## 2020-03-26 NOTE — Assessment & Plan Note (Signed)
Possibly related to poor oral intake following her booster vaccine or less likely related to the booster vaccine.  We will check lab work to evaluate for other causes.

## 2020-03-26 NOTE — Assessment & Plan Note (Signed)
Check A1c. 

## 2020-03-26 NOTE — Patient Instructions (Addendum)
Nice to see you. We will check lab work today and contact you with the results. Please try the triamcinolone for the rash on your arm.  If it is not improving please let us know and we can refer you to dermatology.

## 2020-03-26 NOTE — Addendum Note (Signed)
Addended by: Tor Netters I on: 03/26/2020 10:14 AM   Modules accepted: Orders

## 2020-03-27 ENCOUNTER — Other Ambulatory Visit: Payer: Self-pay

## 2020-03-27 ENCOUNTER — Other Ambulatory Visit (INDEPENDENT_AMBULATORY_CARE_PROVIDER_SITE_OTHER): Payer: Commercial Managed Care - PPO

## 2020-03-27 DIAGNOSIS — R7303 Prediabetes: Secondary | ICD-10-CM

## 2020-03-27 DIAGNOSIS — E039 Hypothyroidism, unspecified: Secondary | ICD-10-CM | POA: Diagnosis not present

## 2020-03-27 DIAGNOSIS — R5383 Other fatigue: Secondary | ICD-10-CM

## 2020-03-27 LAB — COMPREHENSIVE METABOLIC PANEL
ALT: 16 U/L (ref 0–35)
AST: 16 U/L (ref 0–37)
Albumin: 4.1 g/dL (ref 3.5–5.2)
Alkaline Phosphatase: 59 U/L (ref 39–117)
BUN: 9 mg/dL (ref 6–23)
CO2: 28 mEq/L (ref 19–32)
Calcium: 9.2 mg/dL (ref 8.4–10.5)
Chloride: 103 mEq/L (ref 96–112)
Creatinine, Ser: 0.67 mg/dL (ref 0.40–1.20)
GFR: 98.02 mL/min (ref >60.00)
Glucose, Bld: 102 mg/dL — ABNORMAL HIGH (ref 70–99)
Potassium: 4.2 mEq/L (ref 3.5–5.1)
Sodium: 137 mEq/L (ref 135–145)
Total Bilirubin: 0.4 mg/dL (ref 0.2–1.2)
Total Protein: 6.6 g/dL (ref 6.0–8.3)

## 2020-03-27 LAB — VITAMIN B12: Vitamin B-12: 208 pg/mL — ABNORMAL LOW (ref 211–911)

## 2020-03-27 LAB — CBC WITH DIFFERENTIAL/PLATELET
Basophils Absolute: 0 10*3/uL (ref 0.0–0.1)
Basophils Relative: 0.7 % (ref 0.0–3.0)
Eosinophils Absolute: 0.2 10*3/uL (ref 0.0–0.7)
Eosinophils Relative: 2.9 % (ref 0.0–5.0)
HCT: 37 % (ref 36.0–46.0)
Hemoglobin: 12.6 g/dL (ref 12.0–15.0)
Lymphocytes Relative: 35.1 % (ref 12.0–46.0)
Lymphs Abs: 2 10*3/uL (ref 0.7–4.0)
MCHC: 33.9 g/dL (ref 30.0–36.0)
MCV: 93.2 fl (ref 78.0–100.0)
Monocytes Absolute: 0.5 10*3/uL (ref 0.1–1.0)
Monocytes Relative: 8.5 % (ref 3.0–12.0)
Neutro Abs: 2.9 10*3/uL (ref 1.4–7.7)
Neutrophils Relative %: 52.8 % (ref 43.0–77.0)
Platelets: 203 10*3/uL (ref 150.0–400.0)
RBC: 3.97 Mil/uL (ref 3.87–5.11)
RDW: 13.3 % (ref 11.5–15.5)
WBC: 5.6 10*3/uL (ref 4.0–10.5)

## 2020-03-27 LAB — TSH: TSH: 3.79 u[IU]/mL (ref 0.35–4.50)

## 2020-03-27 LAB — HEMOGLOBIN A1C: Hgb A1c MFr Bld: 6.3 % (ref 4.6–6.5)

## 2020-03-27 LAB — VITAMIN D 25 HYDROXY (VIT D DEFICIENCY, FRACTURES): VITD: 13.45 ng/mL — ABNORMAL LOW (ref 30.00–100.00)

## 2020-04-01 ENCOUNTER — Other Ambulatory Visit: Payer: Self-pay | Admitting: Family Medicine

## 2020-04-01 DIAGNOSIS — E559 Vitamin D deficiency, unspecified: Secondary | ICD-10-CM

## 2020-04-01 MED ORDER — VITAMIN D (ERGOCALCIFEROL) 1.25 MG (50000 UNIT) PO CAPS: 50000.0000 [IU] | ORAL_CAPSULE | ORAL | 0 refills | Status: DC

## 2020-04-08 ENCOUNTER — Telehealth: Payer: Self-pay

## 2020-04-08 DIAGNOSIS — E559 Vitamin D deficiency, unspecified: Secondary | ICD-10-CM

## 2020-04-08 NOTE — Telephone Encounter (Signed)
-----   Message from Leone Haven, MD sent at 04/01/2020  1:58 PM EST ----- Please of the patient know her vitamin D level is quite low.  I would like to start her on vitamin D prescription.  I sent this to her pharmacy.  This needs to be rechecked in 8 weeks.  Her B12 is also low.  I would like to start on B12 injections 1000 mcg once weekly for 4 weeks and then once monthly.  Both of those things could be contributing to her decreased energy level.  Her A1c is in the prediabetic range.  She needs to work on diet and exercise for that.  Her other labs are acceptable.

## 2020-04-11 NOTE — Progress Notes (Signed)
Office Visit Note  Patient: Erica Woods             Date of Birth: [birth year]           MRN: [id number]             PCP: Leone Haven, MD Referring: Leone Haven, MD Visit Date: 04/14/2020 Occupation: _0 @  Subjective:  Pain in multiple joint.   History of Present Illness: OSA FOGARTY is a 55 y.o. female with history of psoriatic arthritis and psoriasis.  She continues to have pain and discomfort in her bilateral shoulders, bilateral wrist and her hands.  Her right third knuckle is a still swollen.  She continues to have lower back pain and SI joint pain.  She continues to have swelling in her right ankle and discomfort in her feet.  She has rash from psoriasis on her hands, elbows and her feet.  Activities of Daily Living:  Patient reports morning stiffness for 24 hours.   Patient Reports nocturnal pain.  Difficulty dressing/grooming: Reports Difficulty climbing stairs: Reports Difficulty getting out of chair: Denies Difficulty using hands for taps, buttons, cutlery, and/or writing: Reports  Review of Systems  Constitutional: Positive for fatigue.  HENT: Positive for mouth dryness. Negative for mouth sores and nose dryness.   Eyes: Negative for pain, itching, visual disturbance and dryness.  Respiratory: Negative for cough, hemoptysis, shortness of breath and difficulty breathing.   Cardiovascular: Negative for chest pain, palpitations and swelling in legs/feet.  Gastrointestinal: Positive for constipation. Negative for abdominal pain, blood in stool and diarrhea.  Endocrine: Negative for increased urination.  Genitourinary: Negative for painful urination.  Musculoskeletal: Positive for arthralgias, joint pain, joint swelling, myalgias, muscle weakness, morning stiffness, muscle tenderness and myalgias.  Skin: Negative for color change, rash and redness.  Allergic/Immunologic: Negative for susceptible to infections.  Neurological: Positive for weakness.  Negative for dizziness, numbness, headaches and memory loss.  Hematological: Negative for swollen glands.  Psychiatric/Behavioral: Positive for sleep disturbance. Negative for confusion.    PMFS History:  Patient Active Problem List   Diagnosis Date Noted  . Rash 03/26/2020  . Decreased energy 03/26/2020  . Prediabetes 09/20/2018  . Pain of right hand 09/20/2018  . Stress 11/14/2017  . Hot flashes 07/29/2017  . Hyperlipidemia 07/29/2017  . Hypothyroidism 11/08/2016  . Muscle cramps 11/08/2016  . Plantar fasciitis, left 07/05/2016  . Psoriasis (a type of skin inflammation) 07/05/2016  . Arthralgia of both hands 05/04/2016  . 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 Encounter for general adult medical examination with abnormal findings 05/02/2015  . Migraines 11/25/2014  . GERD (gastroesophageal reflux disease) 11/25/2014  . Atypical lobular hyperplasia [facility]) of left breast 11/22/2014    Past Medical History:  Diagnosis Date  . Allergic rhinitis   . Breast cancer ([place])   . Chickenpox   . GERD (gastroesophageal reflux disease)   . Headache   . Hemorrhoids   . History of blood transfusion   . Hypercholesteremia   . Hyperthyroidism     Family History  Problem Relation Age of Onset  . Diabetes Mother   . Heart attack Father   . Hypertension Other   . Diabetes Other   . Diabetes Sister   . Cancer Sister   . Tuberculosis Brother   . Irregular heart beat Brother   . Parkinson's disease Sister   . Healthy Son   . Irregular heart beat Son   . Healthy Son  Past Surgical History:  Procedure Laterality Date  . ABDOMINAL SURGERY    . APPENDECTOMY    . BALLOON DILATION N/A 02/04/2017   Procedure: BALLOON DILATION;  Surgeon: [name], MD;  Location: [facility] ENDOSCOPY;  Service: Gastroenterology;  Laterality: N/A;  . BREAST BIOPSY Left 10/02/2014   Procedure: LEFT BREAST BIOPSY AFTER NEEDLE LOCALIZATION X TWO;  Surgeon: [name], MD;  Location: [facility]  ORS;  Service: General;  Laterality: Left;  . BREAST LUMPECTOMY  2016   benign  . COLONOSCOPY N/A 10/23/2014   Procedure: COLONOSCOPY;  Surgeon: Rogene Houston, MD;  Location: [facility] ENDO SUITE;  Service: Endoscopy;  Laterality: N/A;  730  . ESOPHAGOGASTRODUODENOSCOPY (EGD) WITH PROPOFOL N/A 02/04/2017   Procedure: ESOPHAGOGASTRODUODENOSCOPY (EGD) WITH PROPOFOL;  Surgeon: [name], MD;  Location: [facility] ENDOSCOPY;  Service: Gastroenterology;  Laterality: N/A;  . LAPAROSCOPIC APPENDECTOMY  03/02/2012   Procedure: APPENDECTOMY LAPAROSCOPIC;  Surgeon: [name], MD;  Location: [facility] ORS;  Service: General;  Laterality: N/A;  . OVARIAN CYST REMOVAL Left   . TUBAL LIGATION    . WISDOM TOOTH EXTRACTION Bilateral    Social History   Social History Narrative  . Not on file   Immunization History  Administered Date(s) Administered  . Influenza,inj,Quad PF,6+ Mos 11/14/2017  . Influenza-Unspecified 11/17/2015, 12/12/2018, 12/03/2019  . Moderna Sars-Covid-2 Vaccination 06/05/2019, 07/03/2019, 01/18/2020  . Tdap 05/02/2015  . Zoster Recombinat (Shingrix) 05/09/2017, 07/12/2017     Objective: Vital Signs: BP 124/76 (BP Location: Left Arm, Patient Position: Sitting, Cuff Size: Normal)   Pulse (!) 57   Ht _0  (1.626 m)   Wt 153 lb 12.8 oz (69.8 kg)   LMP 10/09/2014   BMI 26.40 kg/m    Physical Exam Vitals and nursing note reviewed.  Constitutional:      Appearance: She is well-developed and well-nourished.  HENT:     Head: Normocephalic and atraumatic.  Eyes:     Extraocular Movements: EOM normal.     Conjunctiva/sclera: Conjunctivae normal.  Cardiovascular:     Rate and Rhythm: Normal rate and regular rhythm.     Pulses: Intact distal pulses.     Heart sounds: Normal heart sounds.  Pulmonary:     Effort: Pulmonary effort is normal.     Breath sounds: Normal breath sounds.  Abdominal:     General: Bowel sounds are normal.     Palpations: Abdomen is soft.  Musculoskeletal:      Cervical back: Normal range of motion.  Lymphadenopathy:     Cervical: No cervical adenopathy.  Skin:    General: Skin is warm and dry.     Capillary Refill: Capillary refill takes less than 2 seconds.  Neurological:     Mental Status: She is alert and oriented to person, place, and time.  Psychiatric:        Mood and Affect: Mood and affect normal.        Behavior: Behavior normal.      Musculoskeletal Exam: C-spine was in good range of motion.  She had pain with range of motion of bilateral shoulders.  She had tenderness over bilateral wrist joints, MCPs and PIPs.  She had synovitis of her right first and right third MCP joints.  Hip joints, knee joints, ankles were in good range of motion.  She is swelling over the right ankle joint.  She had tenderness across her MTPs.  She had tenderness over SI joints.  There was no evidence of Achilles tendinitis or plantar fasciitis.  CDAI Exam: CDAI Score: -- Patient Global: --; Provider Global: -- Swollen: --; Tender: -- Joint Exam 04/14/2020   No joint exam has been documented for this visit   There is currently no information documented on the homunculus. Go to the Rheumatology activity and complete the homunculus joint exam.  Investigation: No additional findings.  Imaging: DG Chest 2 View  Result Date: 03/19/2020 CLINICAL DATA:  History of breast cancer. Immunosuppressive therapy. EXAM: CHEST - 2 VIEW COMPARISON:  None. FINDINGS: The heart size and mediastinal contours are within normal limits. Both lungs are clear. The visualized skeletal structures are unremarkable. IMPRESSION: No active cardiopulmonary disease. Electronically Signed   By: Davina Poke D.O.   On: 03/19/2020 13:00   XR Ankle 2 Views Right  Result Date: 03/19/2020 No tibiotalar or subtalar joint space narrowing was noted.  No erosive changes were noted. Impression: Unremarkable x-ray of the ankle joint.  XR Cervical Spine 2 or 3 views  Result Date: 03/19/2020 No  significant disc space narrowing was noted.  Mild facet joint narrowing was noted. Impression: These findings are consistent with mild facet joint arthropathy.  XR Foot 2 Views Left  Result Date: 03/19/2020 First MTP, PIP and DIP narrowing was noted.  No intertarsal, tibiotalar or subtalar joint space narrowing was noted.  Inferior calcaneal spur was noted. Impression: These findings are consistent with osteoarthritis of the foot.  XR Foot 2 Views Right  Result Date: 03/19/2020 First MTP, PIP and DIP narrowing was noted.  No intertarsal, tibiotalar or subtalar joint space narrowing was noted.  Inferior calcaneal spur was noted. Impression: These findings are consistent with osteoarthritis of the foot.  XR Hand 2 View Left  Result Date: 03/19/2020 CMC, PIP and DIP narrowing was noted.  No MCP, intercarpal or radiocarpal joint space narrowing was noted.  No erosive changes were noted. Impression: These findings are consistent with osteoarthritis of the hand.  XR Hand 2 View Right  Result Date: 03/19/2020 CMC, PIP and DIP narrowing was noted.  No MCP, intercarpal or radiocarpal joint space narrowing was noted.  No erosive changes were noted. Impression: These findings are consistent with osteoarthritis of the hand.  XR Lumbar Spine 2-3 Views  Result Date: 03/19/2020 Mild scoliosis was noted.  No significant disc space narrowing was noted.  Facet joint arthropathy was noted. Impression: These findings are consistent with scoliosis and facet joint arthropathy.  XR Pelvis 1-2 Views  Result Date: 03/19/2020 No SI joint to sclerosis or narrowing was noted. Impression: Unremarkable x-ray of the SI joints.   Recent Labs: Lab Results  Component Value Date   WBC 5.6 03/27/2020   HGB 12.6 03/27/2020   PLT 203.0 03/27/2020   NA 137 03/27/2020   K 4.2 03/27/2020   CL 103 03/27/2020   CO2 28 03/27/2020   GLUCOSE 102 (H) 03/27/2020   BUN 9 03/27/2020   CREATININE 0.67 03/27/2020   BILITOT 0.4  03/27/2020   ALKPHOS 59 03/27/2020   AST 16 03/27/2020   ALT 16 03/27/2020   PROT 6.6 03/27/2020   ALBUMIN 4.1 03/27/2020   CALCIUM 9.2 03/27/2020   GFRAA 114 03/19/2020   QFTBGOLDPLUS NEGATIVE 03/19/2020   March 19, 2020 SPEP negative for abnormal protein bands, TB Gold negative, IgA 40 (low), IgG and IgM normal, hepatitis B-, HIV negative, ESR 6, CK 46  Hepatitis C negative on September 24, 2019.   March 27, 2020 vitamin D 13.45, TSH normal  Speciality Comments: No specialty comments available.  Procedures:  No  procedures performed Allergies: Gold-containing drug products   Assessment / Plan:     Visit Diagnoses: Psoriatic arthritis ([place]) - Synovitis over right first and third MCP joints was noted.  She had tenderness in multiple joints as described above.  She has synovitis over right ankle joint.  She has history of recurrent plantar fasciitis.  She has SI joint pain.  Pain and stiffness in multiple joints.  Detailed counseling guarding psoriatic arthritis was provided.  Different treatment options and their side effects were discussed.  Patient was in agreement to proceed with methotrexate.  The plan is to start her on methotrexate 6 tablets p.o. weekly along with folic acid 2 mg p.o. daily.  If labs are normal in 2 weeks then we will increase the dose of methotrexate to 8 tablets p.o. weekly.  She will get labs 2 weeks x 2 and then every 3 months to monitor for drug toxicity.  Drug Counseling TB Gold: 03/19/2020 Hepatitis panel: February 02/ 2022  Chest-xray:  03/19/2020  Contraception: Postmenopausal  Alcohol use: None  Patient was counseled on the purpose, proper use, and adverse effects of methotrexate including nausea, infection, and signs and symptoms of pneumonitis.  Reviewed instructions with patient to take methotrexate weekly along with folic acid daily.  Discussed the importance of frequent monitoring of kidney and liver function and blood counts, and provided  patient with standing lab instructions.  Counseled patient to avoid NSAIDs and alcohol while on methotrexate.  Provided patient with educational materials on methotrexate and answered all questions.  Advised patient to get annual influenza vaccine and to get a pneumococcal vaccine if patient has not already had one.  Patient voiced understanding.  Patient consented to methotrexate use.  Will upload into chart.    High risk medication use-labs obtained at the last visit were discussed with the patient at length.  Her chest x-ray was negative.  We will check labs every 2 weeks x 2 and then every 3 months to monitor for drug toxicity.  She is fully vaccinated against COVID-19 and also received a booster.  Psoriasis - Psoriasis patches noted over her elbows, palmar and plantar surface.  Primary osteoarthritis of both hands-she has some underlying osteoarthritis which causes pain and stiffness.  Chronic right SI joint pain - X-ray was unremarkable.  She has tenderness on palpation over bilateral SI joints.  Trochanteric bursitis of both hips-she has tenderness over bilateral trochanteric bursa.  IT band stretches were discussed.  Primary osteoarthritis of both feet - Osteoarthritis was noted on the x-rays.  History of right ankle joint fracture in the past.  She continues to have some discomfort over her right ankle.  Arthropathy of lumbar facet joint - History of lower back painx 10 years.  History of right-sided sciatica.  X-ray showed mild facet joint arthropathy.  She has chronic lower back pain.  Plantar fasciitis, left - History of recurrent plantar fasciitis for the last 3 years.  Other medical problems are listed as follows:  Prediabetes  History of hyperlipidemia-increased risk of heart disease with psoriatic arthritis was discussed.  Dietary modifications were discussed and the instructions were placed in the AVS.  History of gastroesophageal reflux (GERD)  Lobular carcinoma in situ  (LCIS) of left breast  Hx of migraines  History of hypothyroidism  Vitamin D deficiency-she was recently started on vitamin D 50,000 units/week by her PCP.  Orders: No orders of the defined types were placed in this encounter.  Meds ordered this encounter  Medications  .  methotrexate (RHEUMATREX) 2.5 MG tablet    Sig: Take 15 mg (6 tablets) by mouth once weekly for 2 weeks. Then have labs drawn.    Dispense:  12 tablet    Refill:  0  . folic acid (FOLVITE) 1 MG tablet    Sig: Take 2 tablets (2 mg total) by mouth daily.    Dispense:  180 tablet    Refill:  0     Follow-Up Instructions: Return in about 6 weeks (around 05/26/2020) for Psoriatic arthritis.   Bo Merino, MD  Note - This record has been created using Editor, commissioning.  Chart creation errors have been sought, but may not always  have been located. Such creation errors do not reflect on  the standard of medical care.

## 2020-04-14 ENCOUNTER — Encounter: Payer: Self-pay | Admitting: Rheumatology

## 2020-04-14 ENCOUNTER — Ambulatory Visit: Payer: Commercial Managed Care - PPO | Admitting: Rheumatology

## 2020-04-14 ENCOUNTER — Other Ambulatory Visit: Payer: Self-pay

## 2020-04-14 VITALS — BP 124/76 | HR 57 | Ht 64.0 in | Wt 153.8 lb

## 2020-04-14 DIAGNOSIS — Z8719 Personal history of other diseases of the digestive system: Secondary | ICD-10-CM

## 2020-04-14 DIAGNOSIS — Z79899 Other long term (current) drug therapy: Secondary | ICD-10-CM

## 2020-04-14 DIAGNOSIS — E559 Vitamin D deficiency, unspecified: Secondary | ICD-10-CM

## 2020-04-14 DIAGNOSIS — M533 Sacrococcygeal disorders, not elsewhere classified: Secondary | ICD-10-CM

## 2020-04-14 DIAGNOSIS — R7303 Prediabetes: Secondary | ICD-10-CM

## 2020-04-14 DIAGNOSIS — Z8639 Personal history of other endocrine, nutritional and metabolic disease: Secondary | ICD-10-CM

## 2020-04-14 DIAGNOSIS — L409 Psoriasis, unspecified: Secondary | ICD-10-CM

## 2020-04-14 DIAGNOSIS — M19042 Primary osteoarthritis, left hand: Secondary | ICD-10-CM

## 2020-04-14 DIAGNOSIS — M47816 Spondylosis without myelopathy or radiculopathy, lumbar region: Secondary | ICD-10-CM

## 2020-04-14 DIAGNOSIS — M722 Plantar fascial fibromatosis: Secondary | ICD-10-CM

## 2020-04-14 DIAGNOSIS — M19041 Primary osteoarthritis, right hand: Secondary | ICD-10-CM | POA: Diagnosis not present

## 2020-04-14 DIAGNOSIS — M7062 Trochanteric bursitis, left hip: Secondary | ICD-10-CM

## 2020-04-14 DIAGNOSIS — G8929 Other chronic pain: Secondary | ICD-10-CM

## 2020-04-14 DIAGNOSIS — M19071 Primary osteoarthritis, right ankle and foot: Secondary | ICD-10-CM

## 2020-04-14 DIAGNOSIS — Z8669 Personal history of other diseases of the nervous system and sense organs: Secondary | ICD-10-CM

## 2020-04-14 DIAGNOSIS — D0502 Lobular carcinoma in situ of left breast: Secondary | ICD-10-CM

## 2020-04-14 DIAGNOSIS — L4050 Arthropathic psoriasis, unspecified: Secondary | ICD-10-CM

## 2020-04-14 DIAGNOSIS — M7061 Trochanteric bursitis, right hip: Secondary | ICD-10-CM

## 2020-04-14 DIAGNOSIS — M19072 Primary osteoarthritis, left ankle and foot: Secondary | ICD-10-CM

## 2020-04-14 MED ORDER — METHOTREXATE 2.5 MG PO TABS: ORAL_TABLET | ORAL | 0 refills | Status: DC

## 2020-04-14 MED ORDER — FOLIC ACID 1 MG PO TABS
2.0000 mg | ORAL_TABLET | Freq: Every day | ORAL | 0 refills | Status: DC
Start: 2020-04-14 — End: 2020-07-10

## 2020-04-14 NOTE — Progress Notes (Signed)
Pharmacy Note  Subjective: Patient presents today to [facility] Rheumatology for follow up office visit with Dr. Estanislado Pandy. Patient seen by the pharmacist for counseling on methotrexate for psoriatic arthritis. Patient takes Excedrin Migraine PRN and omeprazole daily.  Objective: CBC    Component Value Date/Time   WBC 5.6 03/27/2020 0947   RBC 3.97 03/27/2020 0947   HGB 12.6 03/27/2020 0947   HGB 12.8 11/29/2019 0843   HGB 13.4 11/22/2016 1228   HCT 37.0 03/27/2020 0947   HCT 40.3 11/22/2016 1228   PLT 203.0 03/27/2020 0947   PLT 239 11/29/2019 0843   PLT 269 11/22/2016 1228   MCV 93.2 03/27/2020 0947   MCV 95.0 11/22/2016 1228   MCH 31.3 03/19/2020 1110   MCHC 33.9 03/27/2020 0947   RDW 13.3 03/27/2020 0947   RDW 12.5 11/22/2016 1228   LYMPHSABS 2.0 03/27/2020 0947   LYMPHSABS 2.2 11/22/2016 1228   MONOABS 0.5 03/27/2020 0947   MONOABS 0.6 11/22/2016 1228   EOSABS 0.2 03/27/2020 0947   EOSABS 0.2 11/22/2016 1228   BASOSABS 0.0 03/27/2020 0947   BASOSABS 0.1 11/22/2016 1228    CMP     Component Value Date/Time   NA 137 03/27/2020 0947   NA 141 11/22/2016 1228   K 4.2 03/27/2020 0947   K 4.2 11/22/2016 1228   CL 103 03/27/2020 0947   CO2 28 03/27/2020 0947   CO2 28 11/22/2016 1228   GLUCOSE 102 (H) 03/27/2020 0947   GLUCOSE 97 11/22/2016 1228   BUN 9 03/27/2020 0947   BUN 9.1 11/22/2016 1228   CREATININE 0.67 03/27/2020 0947   CREATININE 0.68 03/19/2020 1110   CREATININE 0.8 11/22/2016 1228   CALCIUM 9.2 03/27/2020 0947   CALCIUM 9.4 11/22/2016 1228   PROT 6.6 03/27/2020 0947   PROT 7.1 11/22/2016 1228   ALBUMIN 4.1 03/27/2020 0947   ALBUMIN 3.5 11/22/2016 1228   AST 16 03/27/2020 0947   AST 19 11/29/2019 0843   AST 23 11/22/2016 1228   ALT 16 03/27/2020 0947   ALT 19 11/29/2019 0843   ALT 27 11/22/2016 1228   ALKPHOS 59 03/27/2020 0947   ALKPHOS 69 11/22/2016 1228   BILITOT 0.4 03/27/2020 0947   BILITOT 0.3 11/29/2019 0843   BILITOT 0.42 11/22/2016  1228   GFRNONAA 98 03/19/2020 1110   GFRAA 114 03/19/2020 1110    Baseline Immunosuppressant Therapy Labs TB GOLD Quantiferon TB Gold Latest Ref Rng & Units 03/19/2020  Quantiferon TB Gold Plus NEGATIVE NEGATIVE   Hepatitis Panel Hepatitis Latest Ref Rng & Units 03/19/2020  Hep B Surface Ag NON-REACTI NON-REACTIVE  Hep B IgM NON-REACTI NON-REACTIVE  Hep C Ab NON-REACTI -  Hep C Ab NON-REACTI -   HIV Lab Results  Component Value Date   HIV NON-REACTIVE 03/19/2020   HIV NONREACTIVE 05/02/2015   Immunoglobulins Immunoglobulin Electrophoresis Latest Ref Rng & Units 03/19/2020  IgA  47 - 310 mg/dL 40(L)  IgG 600 - 1,640 mg/dL 955  IgM 50 - 300 mg/dL 197   SPEP Serum Protein Electrophoresis Latest Ref Rng & Units 03/27/2020  Total Protein 6.0 - 8.3 g/dL 6.6  Albumin 3.8 - 4.8 g/dL -  Alpha-1 0.2 - 0.3 g/dL -  Alpha-2 0.5 - 0.9 g/dL -  Beta Globulin 0.4 - 0.6 g/dL -  Beta 2 0.2 - 0.5 g/dL -  Gamma Globulin 0.8 - 1.7 g/dL -   Chest-xray:  WNL on 03/19/20  Contraception: post-menopausal  Alcohol use: does not drink alcohol  Assessment/Plan:  Patient was counseled on the purpose, proper use, and adverse effects of methotrexate including nausea, infection, and signs and symptoms of pneumonitis. Discussed that there is the possibility of an increased risk of malignancy, specifically lymphomas, but it is not well understood if this increased risk is due to the medication or the disease state.  Instructed patient that medication should be held for infection and prior to surgery.  Advised patient to avoid live vaccines. Recommend annual influenza, Pneumovax 23, Prevnar 13, and Shingrix as indicated.   Reviewed instructions with patient to take methotrexate weekly along with folic acid daily.  Discussed the importance of frequent monitoring of kidney and liver function and blood counts, and provided patient with standing lab instructions.  Counseled patient to avoid NSAIDs and alcohol while  on methotrexate.  Provided patient with educational materials on methotrexate and answered all questions.   Patient voiced understanding.  Patient consented to methotrexate use.  Will upload into chart.    Dose of methotrexate will be 15mg  once weekly along with folic acid 2 mg daily for 2 weeks. Labs to be drawn in 2 weeks.  If labs normal at 2 weeks then increase to 20 mg weekly. Repeat labs again in 2 weeks, then 4 weeks then every 3 months. [place] will take folic acid 2 mg once daily.  Rx sent to [facility].  All questions encouraged and answered.  Knox Saliva, PharmD, MPH Clinical Pharmacist (Rheumatology and Pulmonology)

## 2020-04-14 NOTE — Patient Instructions (Addendum)
Methotrexate tablets What is this medicine? METHOTREXATE (METH oh TREX ate) is a chemotherapy drug used to treat cancer including breast cancer, leukemia, and lymphoma. This medicine can also be used to treat psoriasis and certain kinds of arthritis. This medicine may be used for other purposes; ask your health care provider or pharmacist if you have questions. COMMON BRAND NAME(S): Rheumatrex, Trexall What should I tell my health care provider before I take this medicine? They need to know if you have any of these conditions:  fluid in the stomach area or lungs  if you often drink alcohol  infection or immune system problems  kidney disease or on hemodialysis  liver disease  low blood counts, like low white cell, platelet, or red cell counts  lung disease  radiation therapy  stomach ulcers  ulcerative colitis  an unusual or allergic reaction to methotrexate, other medicines, foods, dyes, or preservatives  pregnant or trying to get pregnant  breast-feeding How should I use this medicine? Take this medicine by mouth with a glass of water. Follow the directions on the prescription label. Take your medicine at regular intervals. Do not take it more often than directed. Do not stop taking except on your doctor's advice. Make sure you know why you are taking this medicine and how often you should take it. If this medicine is used for a condition that is not cancer, like arthritis or psoriasis, it should be taken weekly, NOT daily. Taking this medicine more often than directed can cause serious side effects, even death. Talk to your healthcare provider about safe handling and disposal of this medicine. You may need to take special precautions. Talk to your pediatrician regarding the use of this medicine in children. While this drug may be prescribed for selected conditions, precautions do apply. Overdosage: If you think you have taken too much of this medicine contact a poison control  center or emergency room at once. NOTE: This medicine is only for you. Do not share this medicine with others. What if I miss a dose? If you miss a dose, talk with your doctor or health care professional. Do not take double or extra doses. What may interact with this medicine? Do not take this medicine with any of the following medications:  acitretin This medicine may also interact with the following medication:  aspirin and aspirin-like medicines including salicylates  azathioprine  certain antibiotics like penicillins, tetracycline, and chloramphenicol  certain medicines that treat or prevent blood clots like warfarin, apixaban, dabigatran, and rivaroxaban  certain medicines for stomach problems like esomeprazole, omeprazole, pantoprazole  cyclosporine  dapsone  diuretics  gold  hydroxychloroquine  live virus vaccines  medicines for infection like acyclovir, adefovir, amphotericin B, bacitracin, cidofovir, foscarnet, ganciclovir, gentamicin, pentamidine, vancomycin  mercaptopurine  NSAIDs, medicines for pain and inflammation, like ibuprofen or naproxen  other cytotoxic agents  pamidronate  pemetrexed  penicillamine  phenylbutazone  phenytoin  probenecid  pyrimethamine  retinoids such as isotretinoin and tretinoin  steroid medicines like prednisone or cortisone  sulfonamides like sulfasalazine and trimethoprim/sulfamethoxazole  theophylline  zoledronic acid This list may not describe all possible interactions. Give your health care provider a list of all the medicines, herbs, non-prescription drugs, or dietary supplements you use. Also tell them if you smoke, drink alcohol, or use illegal drugs. Some items may interact with your medicine. What should I watch for while using this medicine? Avoid alcoholic drinks. This medicine can make you more sensitive to the sun. Keep out of the sun.   If you cannot avoid being in the sun, wear protective clothing  and use sunscreen. Do not use sun lamps or tanning beds/booths. You may need blood work done while you are taking this medicine. Call your doctor or health care professional for advice if you get a fever, chills or sore throat, or other symptoms of a cold or flu. Do not treat yourself. This drug decreases your body's ability to fight infections. Try to avoid being around people who are sick. This medicine may increase your risk to bruise or bleed. Call your doctor or health care professional if you notice any unusual bleeding. Be careful brushing or flossing your teeth or using a toothpick because you may get an infection or bleed more easily. If you have any dental work done, tell your dentist you are receiving this medicine. Check with your doctor or health care professional if you get an attack of severe diarrhea, nausea and vomiting, or if you sweat a lot. The loss of too much body fluid can make it dangerous for you to take this medicine. Talk to your doctor about your risk of cancer. You may be more at risk for certain types of cancers if you take this medicine. Do not become pregnant while taking this medicine or for 6 months after stopping it. Women should inform their health care provider if they wish to become pregnant or think they might be pregnant. Men should not father a child while taking this medicine and for 3 months after stopping it. There is potential for serious harm to an unborn child. Talk to your health care provider for more information. Do not breast-feed an infant while taking this medicine or for 1 week after stopping it. This medicine may make it more difficult to get pregnant or father a child. Talk to your health care provider if you are concerned about your fertility. What side effects may I notice from receiving this medicine? Side effects that you should report to your doctor or health care professional as soon as possible:  allergic reactions like skin rash, itching or  hives, swelling of the face, lips, or tongue  breathing problems or shortness of breath  diarrhea  dry, nonproductive cough  low blood counts - this medicine may decrease the number of white blood cells, red blood cells and platelets. You may be at increased risk for infections and bleeding.  mouth sores  redness, blistering, peeling or loosening of the skin, including inside the mouth  signs of infection - fever or chills, cough, sore throat, pain or trouble passing urine  signs and symptoms of bleeding such as bloody or black, tarry stools; red or dark-brown urine; spitting up blood or brown material that looks like coffee grounds; red spots on the skin; unusual bruising or bleeding from the eye, gums, or nose  signs and symptoms of kidney injury like trouble passing urine or change in the amount of urine  signs and symptoms of liver injury like dark yellow or brown urine; general ill feeling or flu-like symptoms; light-colored stools; loss of appetite; nausea; right upper belly pain; unusually weak or tired; yellowing of the eyes or skin Side effects that usually do not require medical attention (report to your doctor or health care professional if they continue or are bothersome):  dizziness  hair loss  tiredness  upset stomach  vomiting This list may not describe all possible side effects. Call your doctor for medical advice about side effects. You may report side effects to   FDA at 1-800-FDA-1088. Where should I keep my medicine? Keep out of the reach of children and pets. Store at room temperature between 20 and 25 degrees C (68 and 77 degrees F). Protect from light. Get rid of any unused medicine after the expiration date. Talk to your health care provider about how to dispose of unused medicine. Special directions may apply. NOTE: This sheet is a summary. It may not cover all possible information. If you have questions about this medicine, talk to your doctor, pharmacist,  or health care provider.  2021 Elsevier/Gold Standard (2019-09-03 10:40:39)  Standing Labs We placed an order today for your standing lab work.   Please have your standing labs drawn in 2 weeks x 2 and then every  3 months  If possible, please have your labs drawn 2 weeks prior to your appointment so that the provider can discuss your results at your appointment.  We have open lab daily Monday through Thursday from 1:30-4:30 PM and Friday from 1:30-4:00 PM at the office of Dr. Tanicia Wolaver, [facility].   Please be advised, all patients with office appointments requiring lab work will take precedents over walk-in lab work.  If possible, please come for your lab work on Monday and Friday afternoons, as you may experience shorter wait times. The office is located at [address], [address], [place], [place] [address] No appointment is necessary.   Labs are drawn by [facility]. Please bring your co-pay at the time of your lab draw.  You may receive a bill from [facility] for your lab work.  If you wish to have your labs drawn at another location, please call the office 24 hours in advance to send orders.  If you have any questions regarding directions or hours of operation,  please call [phone].   As a reminder, please drink plenty of water prior to coming for your lab work. Thanks!  Vaccines You are taking a medication(s) that can suppress your immune system.  The following immunizations are recommended: . Flu annually . Covid-19  . Pneumonia (Pneumovax 23 and Prevnar 13 spaced at least 1 year apart) . Shingrix (after age 50)  Please check with your PCP to make sure you are up to date. Heart Disease Prevention   Your inflammatory disease increases your risk of heart disease which includes heart attack, stroke, atrial fibrillation (irregular heartbeats), high blood pressure, heart failure and atherosclerosis (plaque in the arteries).  It is important to reduce your risk  by:   . Keep blood pressure, cholesterol, and blood sugar at healthy levels   . Smoking Cessation   . Maintain a healthy weight  o BMI 20-25   . Eat a healthy diet  o Plenty of fresh fruit, vegetables, and whole grains  o Limit saturated fats, foods high in sodium, and added sugars  o DASH and Mediterranean diet   . Increase physical activity  o Recommend moderate physically activity for 150 minutes per week/ 30 minutes a day for five days a week These can be broken up into three separate ten-minute sessions during the day.   . Reduce Stress  . Meditation, slow breathing exercises, yoga, coloring books  . Dental visits twice a year   

## 2020-04-16 ENCOUNTER — Other Ambulatory Visit: Payer: Self-pay

## 2020-04-16 ENCOUNTER — Ambulatory Visit (INDEPENDENT_AMBULATORY_CARE_PROVIDER_SITE_OTHER): Payer: Commercial Managed Care - PPO

## 2020-04-16 DIAGNOSIS — E538 Deficiency of other specified B group vitamins: Secondary | ICD-10-CM | POA: Diagnosis not present

## 2020-04-16 MED ORDER — CYANOCOBALAMIN 1000 MCG/ML IJ SOLN
1000.0000 ug | Freq: Once | INTRAMUSCULAR | Status: AC
  Administered 2020-04-16: 1000 ug via INTRAMUSCULAR

## 2020-04-16 NOTE — Progress Notes (Signed)
Erica Woods presents today for injection per MD orders. B12 injection  administered IM in right Upper Arm. Administration without incident. Patient tolerated well.  Nina,cma

## 2020-04-23 ENCOUNTER — Ambulatory Visit (INDEPENDENT_AMBULATORY_CARE_PROVIDER_SITE_OTHER): Payer: Commercial Managed Care - PPO | Admitting: *Deleted

## 2020-04-23 ENCOUNTER — Other Ambulatory Visit: Payer: Self-pay

## 2020-04-23 DIAGNOSIS — E538 Deficiency of other specified B group vitamins: Secondary | ICD-10-CM | POA: Diagnosis not present

## 2020-04-23 MED ORDER — CYANOCOBALAMIN 1000 MCG/ML IJ SOLN
1000.0000 ug | Freq: Once | INTRAMUSCULAR | Status: DC
Start: 2020-04-23 — End: 2023-06-30

## 2020-04-23 NOTE — Progress Notes (Signed)
Patient presented for B 12 injection to left deltoid, patient voiced no concerns nor showed any signs of distress during injection. 

## 2020-04-29 ENCOUNTER — Other Ambulatory Visit: Payer: Self-pay | Admitting: *Deleted

## 2020-04-29 DIAGNOSIS — Z79899 Other long term (current) drug therapy: Secondary | ICD-10-CM

## 2020-04-29 DIAGNOSIS — L4050 Arthropathic psoriasis, unspecified: Secondary | ICD-10-CM

## 2020-04-29 LAB — COMPLETE METABOLIC PANEL WITH GFR
AG Ratio: 1.8 (calc) (ref 1.0–2.5)
ALT: 18 U/L (ref 6–29)
AST: 18 U/L (ref 10–35)
Albumin: 4.1 g/dL (ref 3.6–5.1)
Alkaline phosphatase (APISO): 66 U/L (ref 37–153)
BUN: 14 mg/dL (ref 7–25)
CO2: 28 mmol/L (ref 20–32)
Calcium: 9 mg/dL (ref 8.6–10.4)
Chloride: 101 mmol/L (ref 98–110)
Creat: 0.78 mg/dL (ref 0.50–1.05)
GFR, Est African American: 98 mL/min/{1.73_m2} (ref >=60)
GFR, Est Non African American: 85 mL/min/{1.73_m2} (ref >=60)
Globulin: 2.3 g/dL (calc) (ref 1.9–3.7)
Glucose, Bld: 102 mg/dL — ABNORMAL HIGH (ref 65–99)
Potassium: 4.2 mmol/L (ref 3.5–5.3)
Sodium: 138 mmol/L (ref 135–146)
Total Bilirubin: 0.5 mg/dL (ref 0.2–1.2)
Total Protein: 6.4 g/dL (ref 6.1–8.1)

## 2020-04-29 LAB — CBC WITH DIFFERENTIAL/PLATELET
Absolute Monocytes: 714 cells/uL (ref 200–950)
Basophils Absolute: 61 cells/uL (ref 0–200)
Basophils Relative: 0.8 %
Eosinophils Absolute: 213 cells/uL (ref 15–500)
Eosinophils Relative: 2.8 %
HCT: 39.3 % (ref 35.0–45.0)
Hemoglobin: 13.1 g/dL (ref 11.7–15.5)
Lymphs Abs: 2037 cells/uL (ref 850–3900)
MCH: 31.1 pg (ref 27.0–33.0)
MCHC: 33.3 g/dL (ref 32.0–36.0)
MCV: 93.3 fL (ref 80.0–100.0)
MPV: 11.3 fL (ref 7.5–12.5)
Monocytes Relative: 9.4 %
Neutro Abs: 4575 cells/uL (ref 1500–7800)
Neutrophils Relative %: 60.2 %
Platelets: 234 10*3/uL (ref 140–400)
RBC: 4.21 10*6/uL (ref 3.80–5.10)
RDW: 13 % (ref 11.0–15.0)
Total Lymphocyte: 26.8 %
WBC: 7.6 10*3/uL (ref 3.8–10.8)

## 2020-04-30 ENCOUNTER — Other Ambulatory Visit: Payer: Self-pay

## 2020-04-30 ENCOUNTER — Ambulatory Visit (INDEPENDENT_AMBULATORY_CARE_PROVIDER_SITE_OTHER): Payer: Commercial Managed Care - PPO | Admitting: *Deleted

## 2020-04-30 DIAGNOSIS — E538 Deficiency of other specified B group vitamins: Secondary | ICD-10-CM | POA: Diagnosis not present

## 2020-04-30 MED ORDER — CYANOCOBALAMIN 1000 MCG/ML IJ SOLN
1000.0000 ug | Freq: Once | INTRAMUSCULAR | Status: AC
  Administered 2020-04-30: 1000 ug via INTRAMUSCULAR

## 2020-04-30 NOTE — Progress Notes (Signed)
CBC and CMP are normal.

## 2020-04-30 NOTE — Progress Notes (Addendum)
Patient presented for B 12 injection to right deltoid, patient voiced no concerns nor showed any signs of distress during injection. 

## 2020-05-07 ENCOUNTER — Other Ambulatory Visit: Payer: Self-pay

## 2020-05-07 ENCOUNTER — Ambulatory Visit (INDEPENDENT_AMBULATORY_CARE_PROVIDER_SITE_OTHER): Payer: Commercial Managed Care - PPO | Admitting: *Deleted

## 2020-05-07 DIAGNOSIS — E538 Deficiency of other specified B group vitamins: Secondary | ICD-10-CM | POA: Diagnosis not present

## 2020-05-07 MED ORDER — CYANOCOBALAMIN 1000 MCG/ML IJ SOLN
1000.0000 ug | Freq: Once | INTRAMUSCULAR | Status: AC
  Administered 2020-05-07: 1000 ug via INTRAMUSCULAR

## 2020-05-07 NOTE — Progress Notes (Signed)
Patient presented for B 12 injection to left deltoid, patient voiced no concerns nor showed any signs of distress during injection. 

## 2020-05-12 ENCOUNTER — Other Ambulatory Visit: Payer: Self-pay

## 2020-05-12 ENCOUNTER — Telehealth: Payer: Self-pay

## 2020-05-12 DIAGNOSIS — Z79899 Other long term (current) drug therapy: Secondary | ICD-10-CM

## 2020-05-12 DIAGNOSIS — L4050 Arthropathic psoriasis, unspecified: Secondary | ICD-10-CM

## 2020-05-12 NOTE — Telephone Encounter (Signed)
Lea from [facility] called stating Erica Woods is a mutual patient who is scheduled for dental cleaning next week.  She states patient told her she is taking Methotrexate and they wanted to make sure there were no precautions that needed to be taken.  Phone [phone]

## 2020-05-12 NOTE — Telephone Encounter (Signed)
I called Lea, patient does not need to take precautions for dental cleanings while taking MTX.

## 2020-05-12 NOTE — Progress Notes (Signed)
Office Visit Note  Patient: Erica Woods             Date of Birth: November 28, 1965           MRN: [id number]             PCP: Leone Haven, MD Referring: Leone Haven, MD Visit Date: 05/26/2020 Occupation: @GUAROCC @  Subjective:  Medication monitoring  History of Present Illness: Erica Woods is a 55 y.o. female with history of psoriatic arthritis and osteoarthritis. She was started on methotrexate and folic acid after her last office visit on 04/14/20.  She has been taking methotrexate 6 tablets once weekly and has had some nausea  24 hours after taking it lasting about 5-6 hours.  She has not increased to 8 tablets weekly yet and needs a refill today.  She states that she has noticed some improvement in her psoriasis since starting on methotrexate but has not noticed much improvement in her joint pain.  She continues to have persistent pain and intermittent inflammation in both hands.  She has chronic pain in both SI joints.  No symptoms of sciatica at this time.  She has intermittent symptoms of plantar fasciitis of the left foot but has not been experiencing any symptoms of Achilles tendinitis. She denies any infections since starting on methotrexate.    Activities of Daily Living:  Patient reports morning stiffness for all day. Patient Denies nocturnal pain.  Difficulty dressing/grooming: Reports Difficulty climbing stairs: Denies Difficulty getting out of chair: Denies Difficulty using hands for taps, buttons, cutlery, and/or writing: Reports  Review of Systems  Constitutional: Negative for fatigue.  HENT: Negative for mouth sores, mouth dryness and nose dryness.   Eyes: Negative for pain, itching and dryness.  Respiratory: Negative for shortness of breath and difficulty breathing.   Cardiovascular: Negative for chest pain and palpitations.  Gastrointestinal: Negative for blood in stool, constipation and diarrhea.  Endocrine: Negative for increased urination.   Genitourinary: Negative for difficulty urinating.  Musculoskeletal: Positive for arthralgias, joint pain, joint swelling, myalgias, morning stiffness, muscle tenderness and myalgias.  Skin: Negative for color change, rash and redness.  Allergic/Immunologic: Negative for susceptible to infections.  Neurological: Negative for dizziness, numbness, headaches, memory loss and weakness.  Hematological: Negative for bruising/bleeding tendency.  Psychiatric/Behavioral: Negative for confusion.    PMFS History:  Patient Active Problem List   Diagnosis Date Noted  . Rash 03/26/2020  . Decreased energy 03/26/2020  . Prediabetes 09/20/2018  . Pain of right hand 09/20/2018  . Stress 11/14/2017  . Hot flashes 07/29/2017  . Hyperlipidemia 07/29/2017  . Hypothyroidism 11/08/2016  . Muscle cramps 11/08/2016  . Plantar fasciitis, left 07/05/2016  . Psoriasis (a type of skin inflammation) 07/05/2016  . Arthralgia of both hands 05/04/2016  . 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 Encounter for general adult medical examination with abnormal findings 05/02/2015  . Migraines 11/25/2014  . GERD (gastroesophageal reflux disease) 11/25/2014  . 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 Allergic rhinitis   . Breast cancer ([place])   . Chickenpox   . GERD (gastroesophageal reflux disease)   . Headache   . Hemorrhoids   . History of blood transfusion   . Hypercholesteremia   . Hyperthyroidism     Family History  Problem Relation Age of Onset  . Diabetes Mother   . Heart attack Father   . Hypertension  Other   . Diabetes Other   . Diabetes Sister   . Cancer Sister   . Tuberculosis Brother   . Irregular heart beat Brother   . Parkinson's disease Sister   . Healthy Son   . Irregular heart beat Son   . Healthy Son    Past Surgical History:  Procedure Laterality Date  . ABDOMINAL SURGERY    . APPENDECTOMY     . BALLOON DILATION N/A 02/04/2017   Procedure: BALLOON DILATION;  Surgeon: [name], MD;  Location: [facility] ENDOSCOPY;  Service: Gastroenterology;  Laterality: N/A;  . BREAST BIOPSY Left 10/02/2014   Procedure: LEFT BREAST BIOPSY AFTER NEEDLE LOCALIZATION X TWO;  Surgeon: [name], MD;  Location: [facility] ORS;  Service: General;  Laterality: Left;  . BREAST LUMPECTOMY  2016   benign  . COLONOSCOPY N/A 10/23/2014   Procedure: COLONOSCOPY;  Surgeon: Rogene Houston, MD;  Location: [facility] ENDO SUITE;  Service: Endoscopy;  Laterality: N/A;  730  . ESOPHAGOGASTRODUODENOSCOPY (EGD) WITH PROPOFOL N/A 02/04/2017   Procedure: ESOPHAGOGASTRODUODENOSCOPY (EGD) WITH PROPOFOL;  Surgeon: [name], MD;  Location: [facility] ENDOSCOPY;  Service: Gastroenterology;  Laterality: N/A;  . LAPAROSCOPIC APPENDECTOMY  03/02/2012   Procedure: APPENDECTOMY LAPAROSCOPIC;  Surgeon: [name], MD;  Location: [facility] ORS;  Service: General;  Laterality: N/A;  . OVARIAN CYST REMOVAL Left   . TUBAL LIGATION    . WISDOM TOOTH EXTRACTION Bilateral    Social History   Social History Narrative  . Not on file   Immunization History  Administered Date(s) Administered  . Influenza,inj,Quad PF,6+ Mos 11/14/2017  . Influenza-Unspecified 11/17/2015, 12/12/2018, 12/03/2019  . Moderna Sars-Covid-2 Vaccination 06/05/2019, 07/03/2019, 01/18/2020  . Tdap 05/02/2015  . Zoster Recombinat (Shingrix) 05/09/2017, 07/12/2017     Objective: Vital Signs: BP (!) 145/80 (BP Location: Left Arm, Patient Position: Sitting, Cuff Size: Normal)   Pulse 67   Resp 13   Ht 5\' 4"  (1.626 m)   Wt 153 lb 12.8 oz (69.8 kg)   LMP 10/09/2014   BMI 26.40 kg/m    Physical Exam Vitals and nursing note reviewed.  Constitutional:      Appearance: She is well-developed.  HENT:     Head: Normocephalic and atraumatic.  Eyes:     Conjunctiva/sclera: Conjunctivae normal.  Pulmonary:     Effort: Pulmonary effort is normal.  Abdominal:     Palpations: Abdomen  is soft.  Musculoskeletal:     Cervical back: Normal range of motion.  Skin:    General: Skin is warm and dry.     Capillary Refill: Capillary refill takes less than 2 seconds.  Neurological:     Mental Status: She is alert and oriented to person, place, and time.  Psychiatric:        Behavior: Behavior normal.      Musculoskeletal Exam: C-spine, thoracic spine, and lumbar spine good ROM.  Tenderness over both SI joints and midline in the lumbar region.  Shoulder joints have painful ROM bilaterally.  Elbow joints, wrist joints, MCPs, PIPs, and DIPs good ROM with no synovitis. Tenderness over the right first and third MCP joints.  Hip joints have good ROM with discomfort bilaterally.  Knee joints good ROM with no warmth or effusion.  Tenderness over the right ankle joint.  Tenderness along the plantar fascia of the left foot.  Tenderness over the dorsal aspect of both feet and some over the MTP joints.  No dactylitis was noted.   CDAI Exam: CDAI Score: 3.3  Patient Global: 8 mm; Provider Global: 5 mm Swollen: 0 ; Tender: 13  Joint Exam 05/26/2020      Right  Left  MCP 1   Tender     MCP 3   Tender     Lumbar Spine   Tender     Sacroiliac   Tender   Tender  Ankle   Tender     Tarsometatarsal   Tender   Tender  MTP 1   Tender     MTP 2   Tender     MTP 3   Tender   Tender  MTP 4      Tender     Investigation: No additional findings.  Imaging: No results found.  Recent Labs: Lab Results  Component Value Date   WBC 7.3 05/12/2020   HGB 13.2 05/12/2020   PLT 253 05/12/2020   NA 139 05/12/2020   K 4.7 05/12/2020   CL 103 05/12/2020   CO2 25 05/12/2020   GLUCOSE 86 05/12/2020   BUN 13 05/12/2020   CREATININE 0.76 05/12/2020   BILITOT 0.4 05/12/2020   ALKPHOS 59 03/27/2020   AST 14 05/12/2020   ALT 15 05/12/2020   PROT 6.5 05/12/2020   ALBUMIN 4.1 03/27/2020   CALCIUM 9.2 05/12/2020   GFRAA 102 05/12/2020   QFTBGOLDPLUS NEGATIVE 03/19/2020    Speciality  Comments: No specialty comments available.  Procedures:  No procedures performed Allergies: Gold-containing drug products    Assessment / Plan:     Visit Diagnoses: Psoriatic arthritis [facility]): She presents today with ongoing pain and stiffness in multiple joints including both hands, both SI joints, and both feet.  She has tenderness palpation over bilateral SI joints, right ankle joint, and the right first and third MCP joints.  She has no obvious synovitis or dactylitis on examination today.  She was started on methotrexate 6 tablets by mouth once weekly and folic acid 2 mg by mouth daily at her last office visit on 04/14/2020.  She has had mild nausea 24 hours after taking methotrexate which lasts about 5 to 6 hours but she is otherwise tolerating it without any other side effects.  She has not missed any doses of MTX and has not had any recent infections.  She has started to notice improvement in her psoriasis since starting on  MTX but has had minimal improvement in her joint pain. She was advised to increase methotrexate to 8 tablets by mouth once weekly and continue on folic acid 2 mg daily.  She will return for lab work in 2 weeks, 2 months, then every 3 months. She was encouraged to notify us if the nausea persists or worsens and we can send in a prescription for zofran 4 mg every 8 hours as needed for nausea.  She will follow up in 2 months to reassess her response to methotrexate.   Psoriasis: Psoriasis patches noted on extensor surface of both elbows, palmar surface of both hands/wrsits, and plantar aspect of both feet.  She has started to notify improvement in her patches of psoriasis since starting on Methotrexate.  She will be increasing MTX to 8 tablets once weekly.  She continues to use triamcinolone cream topically as needed.   High risk medication use - She was started on methotrexate 6 tablets once weekly after her last OV on 04/14/20.  She did not increase the dose of MTX after her last  lab work as previously recommended.  She will increasing methotrexate to 8 tablets  once weekly.  CBC and CMP WNL on 05/12/20. She will update lab work in 2 weeks, 2 months, then every 3 months. Standing orders for CBC and CMP are in place.  She has received 3 moderna covid-19 vaccine doses. Encouraged to receive the 4th dose and to hold MTX for 1 week after the vaccine.  She has not had any recent infections.  She was advised to hold MTX if she develops signs or symptoms of an infection and to resume once the infection has completely cleared.   She has received 3 moderna covid-19 vaccine doses.  Advised to hold MTX 1 week after receiving the 2nd booster dose.   Primary osteoarthritis of both hands: She has PIP and DIP thickening consistent with osteoarthritis of both hands. She has ongoing pain and stiffness in both hands.  No inflammation was noted on exam.    Chronic right SI joint pain - X-ray was unremarkable.  She has tenderness to palpation over both SI joints.   Plantar fasciitis, left - History of recurrent plantar fasciitis for the last 3 years.  She has undergone cortisone injections in the past. She has tenderness to palpation over the plantar fascia of the left foot.  Discussed the importance of wearing proper fitting shoes.   Trochanteric bursitis of both hips: No tenderness to palpation on exam.    Primary osteoarthritis of both feet - Osteoarthritis was noted on the x-rays.  History of right ankle joint fracture in the past.   She has tenderness to palpation over the right ankle joint.  She has been experiencing increased discomfort in both feet over the past several weeks.    Arthropathy of lumbar facet joint - History of lower back painx 10 years.  History of right-sided sciatica.  X-ray showed mild facet joint arthropathy.  She has chronic lower back pain and stiffness.  Midline spinal tenderness in the lumbar region noted.  No symptoms of radiculopathy at this time.   Vitamin D  deficiency - 03/27/20: vitamin D 13.45.  She is taking vitamin D 50,000 units weekly.   Other medical conditions are listed as follows:   Prediabetes  History of hyperlipidemia  History of gastroesophageal reflux (GERD)  Hx of migraines  Lobular carcinoma in situ (LCIS) of left breast  History of hypothyroidism  Orders: No orders of the defined types were placed in this encounter.  Meds ordered this encounter  Medications  . methotrexate (RHEUMATREX) 2.5 MG tablet    Sig: Take 8 tablets (20 mg total) by mouth once a week. Caution:Chemotherapy. Protect from light.    Dispense:  96 tablet    Refill:  0     Follow-Up Instructions: Return in about 2 months (around 07/26/2020) for Psoriatic arthritis, Osteoarthritis.   Ofilia Neas, PA-C  Note - This record has been created using Dragon software.  Chart creation errors have been sought, but may not always  have been located. Such creation errors do not reflect on  the standard of medical care.

## 2020-05-13 LAB — CBC WITH DIFFERENTIAL/PLATELET
Absolute Monocytes: 621 cells/uL (ref 200–950)
Basophils Absolute: 73 cells/uL (ref 0–200)
Basophils Relative: 1 %
Eosinophils Absolute: 197 cells/uL (ref 15–500)
Eosinophils Relative: 2.7 %
HCT: 39.3 % (ref 35.0–45.0)
Hemoglobin: 13.2 g/dL (ref 11.7–15.5)
Lymphs Abs: 2854 cells/uL (ref 850–3900)
MCH: 31.2 pg (ref 27.0–33.0)
MCHC: 33.6 g/dL (ref 32.0–36.0)
MCV: 92.9 fL (ref 80.0–100.0)
MPV: 11.7 fL (ref 7.5–12.5)
Monocytes Relative: 8.5 %
Neutro Abs: 3555 cells/uL (ref 1500–7800)
Neutrophils Relative %: 48.7 %
Platelets: 253 10*3/uL (ref 140–400)
RBC: 4.23 10*6/uL (ref 3.80–5.10)
RDW: 12.9 % (ref 11.0–15.0)
Total Lymphocyte: 39.1 %
WBC: 7.3 10*3/uL (ref 3.8–10.8)

## 2020-05-13 LAB — COMPLETE METABOLIC PANEL WITH GFR
AG Ratio: 1.5 (calc) (ref 1.0–2.5)
ALT: 15 U/L (ref 6–29)
AST: 14 U/L (ref 10–35)
Albumin: 3.9 g/dL (ref 3.6–5.1)
Alkaline phosphatase (APISO): 62 U/L (ref 37–153)
BUN: 13 mg/dL (ref 7–25)
CO2: 25 mmol/L (ref 20–32)
Calcium: 9.2 mg/dL (ref 8.6–10.4)
Chloride: 103 mmol/L (ref 98–110)
Creat: 0.76 mg/dL (ref 0.50–1.05)
GFR, Est African American: 102 mL/min/{1.73_m2} (ref >=60)
GFR, Est Non African American: 88 mL/min/{1.73_m2} (ref >=60)
Globulin: 2.6 g/dL (calc) (ref 1.9–3.7)
Glucose, Bld: 86 mg/dL (ref 65–99)
Potassium: 4.7 mmol/L (ref 3.5–5.3)
Sodium: 139 mmol/L (ref 135–146)
Total Bilirubin: 0.4 mg/dL (ref 0.2–1.2)
Total Protein: 6.5 g/dL (ref 6.1–8.1)

## 2020-05-13 NOTE — Progress Notes (Signed)
CBC and CMP normal

## 2020-05-26 ENCOUNTER — Ambulatory Visit: Payer: Commercial Managed Care - PPO | Admitting: Physician Assistant

## 2020-05-26 ENCOUNTER — Encounter: Payer: Self-pay | Admitting: Physician Assistant

## 2020-05-26 ENCOUNTER — Other Ambulatory Visit: Payer: Self-pay

## 2020-05-26 VITALS — BP 145/80 | HR 67 | Resp 13 | Ht 64.0 in | Wt 153.8 lb

## 2020-05-26 DIAGNOSIS — M47816 Spondylosis without myelopathy or radiculopathy, lumbar region: Secondary | ICD-10-CM

## 2020-05-26 DIAGNOSIS — R7303 Prediabetes: Secondary | ICD-10-CM

## 2020-05-26 DIAGNOSIS — Z79899 Other long term (current) drug therapy: Secondary | ICD-10-CM

## 2020-05-26 DIAGNOSIS — Z8719 Personal history of other diseases of the digestive system: Secondary | ICD-10-CM

## 2020-05-26 DIAGNOSIS — M19042 Primary osteoarthritis, left hand: Secondary | ICD-10-CM

## 2020-05-26 DIAGNOSIS — Z8669 Personal history of other diseases of the nervous system and sense organs: Secondary | ICD-10-CM

## 2020-05-26 DIAGNOSIS — M7062 Trochanteric bursitis, left hip: Secondary | ICD-10-CM

## 2020-05-26 DIAGNOSIS — L409 Psoriasis, unspecified: Secondary | ICD-10-CM | POA: Diagnosis not present

## 2020-05-26 DIAGNOSIS — E559 Vitamin D deficiency, unspecified: Secondary | ICD-10-CM

## 2020-05-26 DIAGNOSIS — M19041 Primary osteoarthritis, right hand: Secondary | ICD-10-CM | POA: Diagnosis not present

## 2020-05-26 DIAGNOSIS — M19072 Primary osteoarthritis, left ankle and foot: Secondary | ICD-10-CM

## 2020-05-26 DIAGNOSIS — M19071 Primary osteoarthritis, right ankle and foot: Secondary | ICD-10-CM

## 2020-05-26 DIAGNOSIS — D0502 Lobular carcinoma in situ of left breast: Secondary | ICD-10-CM

## 2020-05-26 DIAGNOSIS — L4050 Arthropathic psoriasis, unspecified: Secondary | ICD-10-CM | POA: Diagnosis not present

## 2020-05-26 DIAGNOSIS — Z8639 Personal history of other endocrine, nutritional and metabolic disease: Secondary | ICD-10-CM

## 2020-05-26 DIAGNOSIS — M7061 Trochanteric bursitis, right hip: Secondary | ICD-10-CM

## 2020-05-26 DIAGNOSIS — G8929 Other chronic pain: Secondary | ICD-10-CM

## 2020-05-26 DIAGNOSIS — M722 Plantar fascial fibromatosis: Secondary | ICD-10-CM

## 2020-05-26 DIAGNOSIS — M533 Sacrococcygeal disorders, not elsewhere classified: Secondary | ICD-10-CM

## 2020-05-26 MED ORDER — METHOTREXATE 2.5 MG PO TABS: 20.0000 mg | ORAL_TABLET | ORAL | 0 refills | Status: DC

## 2020-05-26 NOTE — Patient Instructions (Signed)
Standing Labs We placed an order today for your standing lab work.   Please have your standing labs drawn in 2 weeks, 2 months, then every 3 months   If possible, please have your labs drawn 2 weeks prior to your appointment so that the provider can discuss your results at your appointment.  We have open lab daily Monday through Thursday from 1:30-4:30 PM and Friday from 1:30-4:00 PM at the office of Dr. Bo Merino, [facility].   Please be advised, all patients with office appointments requiring lab work will take precedents over walk-in lab work.  If possible, please come for your lab work on Monday and Friday afternoons, as you may experience shorter wait times. The office is located at [address] [place], [place], [place] [address] No appointment is necessary.   Labs are drawn by [facility]. Please bring your co-pay at the time of your lab draw.  You may receive a bill from [facility] for your lab work.  If you wish to have your labs drawn at another location, please call the office 24 hours in advance to send orders.  If you have any questions regarding directions or hours of operation,  please call [phone].   As a reminder, please drink plenty of water prior to coming for your lab work. Thanks!

## 2020-06-09 ENCOUNTER — Other Ambulatory Visit: Payer: Self-pay

## 2020-06-09 ENCOUNTER — Other Ambulatory Visit (INDEPENDENT_AMBULATORY_CARE_PROVIDER_SITE_OTHER): Payer: Commercial Managed Care - PPO

## 2020-06-09 DIAGNOSIS — E559 Vitamin D deficiency, unspecified: Secondary | ICD-10-CM | POA: Diagnosis not present

## 2020-06-09 LAB — VITAMIN D 25 HYDROXY (VIT D DEFICIENCY, FRACTURES): VITD: 30.02 ng/mL (ref 30.00–100.00)

## 2020-06-16 ENCOUNTER — Other Ambulatory Visit: Payer: Self-pay

## 2020-06-16 DIAGNOSIS — Z79899 Other long term (current) drug therapy: Secondary | ICD-10-CM

## 2020-06-16 DIAGNOSIS — L4050 Arthropathic psoriasis, unspecified: Secondary | ICD-10-CM

## 2020-06-17 LAB — CBC WITH DIFFERENTIAL/PLATELET
Absolute Monocytes: 340 cells/uL (ref 200–950)
Basophils Absolute: 41 cells/uL (ref 0–200)
Basophils Relative: 0.6 %
Eosinophils Absolute: 156 cells/uL (ref 15–500)
Eosinophils Relative: 2.3 %
HCT: 41.5 % (ref 35.0–45.0)
Hemoglobin: 14 g/dL (ref 11.7–15.5)
Lymphs Abs: 2115 cells/uL (ref 850–3900)
MCH: 31.5 pg (ref 27.0–33.0)
MCHC: 33.7 g/dL (ref 32.0–36.0)
MCV: 93.5 fL (ref 80.0–100.0)
MPV: 10.8 fL (ref 7.5–12.5)
Monocytes Relative: 5 %
Neutro Abs: 4148 cells/uL (ref 1500–7800)
Neutrophils Relative %: 61 %
Platelets: 274 10*3/uL (ref 140–400)
RBC: 4.44 10*6/uL (ref 3.80–5.10)
RDW: 13.2 % (ref 11.0–15.0)
Total Lymphocyte: 31.1 %
WBC: 6.8 10*3/uL (ref 3.8–10.8)

## 2020-06-17 LAB — COMPLETE METABOLIC PANEL WITH GFR
AG Ratio: 1.7 (calc) (ref 1.0–2.5)
ALT: 24 U/L (ref 6–29)
AST: 23 U/L (ref 10–35)
Albumin: 4.2 g/dL (ref 3.6–5.1)
Alkaline phosphatase (APISO): 79 U/L (ref 37–153)
BUN: 11 mg/dL (ref 7–25)
CO2: 29 mmol/L (ref 20–32)
Calcium: 9.7 mg/dL (ref 8.6–10.4)
Chloride: 101 mmol/L (ref 98–110)
Creat: 0.65 mg/dL (ref 0.50–1.05)
GFR, Est African American: 115 mL/min/{1.73_m2} (ref >=60)
GFR, Est Non African American: 99 mL/min/{1.73_m2} (ref >=60)
Globulin: 2.5 g/dL (calc) (ref 1.9–3.7)
Glucose, Bld: 97 mg/dL (ref 65–99)
Potassium: 4.8 mmol/L (ref 3.5–5.3)
Sodium: 138 mmol/L (ref 135–146)
Total Bilirubin: 0.5 mg/dL (ref 0.2–1.2)
Total Protein: 6.7 g/dL (ref 6.1–8.1)

## 2020-06-17 NOTE — Progress Notes (Signed)
CBC and CMP normal

## 2020-07-10 ENCOUNTER — Other Ambulatory Visit: Payer: Self-pay | Admitting: Rheumatology

## 2020-07-10 DIAGNOSIS — L4050 Arthropathic psoriasis, unspecified: Secondary | ICD-10-CM

## 2020-07-10 DIAGNOSIS — L409 Psoriasis, unspecified: Secondary | ICD-10-CM

## 2020-07-10 NOTE — Telephone Encounter (Signed)
Next Visit: 07/29/2020  Last Visit: 05/26/2020  Last Fill: 04/14/2020  Dx:  Psoriatic arthritis   Current Dose per office note on 9/68/8648,  folic acid 2 mg daily  Okay to refill folic acid?

## 2020-07-15 NOTE — Progress Notes (Signed)
Office Visit Note  Patient: Erica Woods             Date of Birth: August 24, 1965           MRN: [id number]             PCP: Leone Haven, MD Referring: Leone Haven, MD Visit Date: 07/29/2020 Occupation: @GUAROCC @  Subjective:  Discuss medication options   History of Present Illness: Erica Woods is a 55 y.o. female with history of psoriatic arthritis and osteoarthritis.  Patient is taking methotrexate 8 tablets by mouth once weekly.  She was started on methotrexate 6 tablets by mouth once weekly after her office visit on 04/14/2020.  She did not increase methotrexate to 8 tablets in July after her office visit on 05/26/2020.  She was unable to tolerate folic acid due to increased reflux and has discontinued. According to the patient she has only noticed about a 30% improvement in her joint pain and stiffness since starting on methotrexate.  She continues to have pain and intermittent swelling in both hands.  She has ongoing plantar fasciitis in the left foot and tenderness and discomfort in the right SI joint.  She has noticed some improvement in her psoriasis.  She has been using topical agents as needed but has noticed less scaling of the patches on her elbows, hands, and feet.  She denies any recent infections.      Activities of Daily Living:  Patient reports joint stiffness all day  Patient Reports nocturnal pain.  Difficulty dressing/grooming: Reports Difficulty climbing stairs: Reports Difficulty getting out of chair: Reports Difficulty using hands for taps, buttons, cutlery, and/or writing: Reports  Review of Systems  Constitutional:  Positive for fatigue.  HENT:  Negative for mouth sores, mouth dryness and nose dryness.   Eyes:  Negative for pain, itching, visual disturbance and dryness.  Respiratory:  Negative for cough, hemoptysis, shortness of breath and difficulty breathing.   Cardiovascular:  Negative for chest pain, palpitations and swelling in legs/feet.   Gastrointestinal:  Negative for abdominal pain, blood in stool, constipation and diarrhea.  Endocrine: Negative for increased urination.  Genitourinary:  Negative for painful urination.  Musculoskeletal:  Positive for joint pain, joint pain, joint swelling and morning stiffness. Negative for myalgias, muscle weakness, muscle tenderness and myalgias.  Skin:  Negative for color change, rash and redness.  Allergic/Immunologic: Negative for susceptible to infections.  Neurological:  Positive for weakness. Negative for dizziness, numbness, headaches and memory loss.  Hematological:  Negative for swollen glands.  Psychiatric/Behavioral:  Positive for sleep disturbance. Negative for confusion.    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   Encounter for general adult medical examination with abnormal findings 05/02/2015   Migraines 11/25/2014   GERD (gastroesophageal reflux disease) 11/25/2014   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 11/22/2014    Past Medical History:  Diagnosis Date   Allergic rhinitis    Breast cancer ([place])    Chickenpox    GERD (gastroesophageal reflux disease)  Headache    Hemorrhoids    History of blood transfusion    Hypercholesteremia    Hyperthyroidism     Family History  Problem Relation Age of Onset   Diabetes Mother    Heart attack Father    Hypertension Other    Diabetes Other    Diabetes Sister    Cancer Sister    Tuberculosis Brother    Irregular heart beat Brother    Parkinson's disease  Sister    Healthy Son    Irregular heart beat Son    Healthy Son    Past Surgical History:  Procedure Laterality Date   ABDOMINAL SURGERY     APPENDECTOMY     BALLOON DILATION N/A 02/04/2017   Procedure: [place];  Surgeon: [name], MD;  Location[facility] ENDOSCOPY;  Service: Gastroenterology;  Laterality: N/A;   BREAST BIOPSY Left 10/02/2014   Procedure: LEFT BREAST BIOPSY AFTER NEEDLE LOCALIZATION X TWO;  Surgeon: [name], MD;  Location: [facility] ORS;  Service: General;  Laterality: Left;   BREAST LUMPECTOMY  2016   benign   COLONOSCOPY N/A 10/23/2014   Procedure: COLONOSCOPY;  Surgeon: Rogene Houston, MD;  Location: [facility] ENDO SUITE;  Service: Endoscopy;  Laterality: N/A;  730   ESOPHAGOGASTRODUODENOSCOPY (EGD) WITH PROPOFOL N/A 02/04/2017   Procedure: ESOPHAGOGASTRODUODENOSCOPY (EGD) WITH PROPOFOL;  Surgeon: [name], MD;  Location: [facility] ENDOSCOPY;  Service: Gastroenterology;  Laterality: N/A;   LAPAROSCOPIC APPENDECTOMY  03/02/2012   Procedure: APPENDECTOMY LAPAROSCOPIC;  Surgeon: [name], MD;  Location: [facility] ORS;  Service: General;  Laterality: N/A;   OVARIAN CYST REMOVAL Left    TUBAL LIGATION     WISDOM TOOTH EXTRACTION Bilateral    Social History   Social History Narrative   Not on file   Immunization History  Administered Date(s) Administered   Influenza,inj,Quad PF,6+ Mos 11/14/2017   Influenza-Unspecified 11/17/2015, 12/12/2018, 12/03/2019   Moderna Sars-Covid-2 Vaccination 06/05/2019, 07/03/2019, 01/18/2020, 07/04/2020   Tdap 05/02/2015   Zoster Recombinat (Shingrix) 05/09/2017, 07/12/2017     Objective: Vital Signs: BP 107/62 (BP Location: Left Arm, Patient Position: Sitting, Cuff Size: Normal)   Pulse 66   Ht 5\' 4"  (1.626 m)   Wt 154 lb (69.9 kg)   LMP 10/09/2014   BMI 26.43 kg/m    Physical Exam Vitals and nursing note reviewed.  Constitutional:      Appearance: She is well-developed.  HENT:     Head: Normocephalic and atraumatic.   Eyes:     Conjunctiva/sclera: Conjunctivae normal.  Pulmonary:     Effort: Pulmonary effort is normal.  Abdominal:     Palpations: Abdomen is soft.  Musculoskeletal:     Cervical back: Normal range of motion.  Skin:    General: Skin is warm and dry.     Capillary Refill: Capillary refill takes less than 2 seconds.     Comments: Dry scales with underlying thickened patches of erythema on extensor surface of both elbows, volar aspect of right wrist, dorsal aspect of hands, and dorsal aspect of feet  Neurological:     Mental Status: She is alert and oriented to person, place, and time.  Psychiatric:        Behavior: Behavior normal.     Musculoskeletal Exam: C-spine, thoracic spine, and lumbar spine good ROM with no discomfort.  Tenderness over the right SI joint.  No midline spinal tenderness.  Shoulder joints, elbow joints, and wrist joints good ROM with no discomfort.  Tenderness over the right 2nd-5th  PIP and all DIP joints.  Tenderness over the left 1st MCP, all DIP, and 2nd-5th PIP joints.  Complete fist formation bilaterally.  Hip joints good ROM with no discomfort.  Tenderness over the right trochanteric bursa.  Knee joints good ROM with no discomfort.  No warmth or effusion of knee joints.  No tenderness or swelling of ankle joints.  Plantar fasciitis of left foot.   CDAI Exam: CDAI Score: 10.4  Patient Global: 8 mm; Provider Global: 6 mm Swollen: 0 ; Tender: 18  Joint Exam 07/29/2020      Right  Left  MCP 1      Tender  PIP 2   Tender   Tender  PIP 3   Tender   Tender  PIP 4   Tender   Tender  PIP 5   Tender   Tender  DIP 2   Tender   Tender  DIP 3   Tender   Tender  DIP 4   Tender   Tender  DIP 5   Tender   Tender  Sacroiliac   Tender        Investigation: No additional findings.  Imaging: No results found.  Recent Labs: Lab Results  Component Value Date   WBC 6.8 06/16/2020   HGB 14.0 06/16/2020   PLT 274 06/16/2020   NA 138 06/16/2020   K 4.8  06/16/2020   CL 101 06/16/2020   CO2 29 06/16/2020   GLUCOSE 97 06/16/2020   BUN 11 06/16/2020   CREATININE 0.65 06/16/2020   BILITOT 0.5 06/16/2020   ALKPHOS 59 03/27/2020   AST 23 06/16/2020   ALT 24 06/16/2020   PROT 6.7 06/16/2020   ALBUMIN 4.1 03/27/2020   CALCIUM 9.7 06/16/2020   GFRAA 115 06/16/2020   QFTBGOLDPLUS NEGATIVE 03/19/2020    Speciality Comments: No specialty comments available.  Procedures:  No procedures performed Allergies: Gold-containing drug products    Assessment / Plan:     Visit Diagnoses: Psoriatic arthritis ([place]): She has ongoing joint pain and stiffness in multiple joints as described above.  She has tenderness of all PIP and DIP joints in both hands, right SI joint, and ongoing plantar fasciitis of the left foot.  She has thickened patches of plaque psoriasis on the extensor surface of both elbows, volar aspect of right wrist, dorsal aspect of both hands, and both feet.  She has noticed about a 30% improvement in her joint pain and inflammation since starting on methotrexate.  She initially started on 6 tablets of methotrexate after her office visit on 04/14/20 and then increased to 8 tablets after her office visit on 05/26/20.  She was unable to tolerate folic acid due to experiencing worsening reflux.  We discussed that she cannot take methotrexate without taking folic acid but she does not want to restart at this time.  Different treatment options were discussed today in detail.  Indications, contraindications, potential side effects of Tremfya were discussed.  All questions were addressed and consent was obtained.  Once approved she will return to the office for administration of the first injection.  She will be discontinuing methotrexate.  A prednisone taper starting 20 mg tapering by 5 mg every 4 days was sent to the pharmacy today.  She will follow-up in the office in 6 to 8 weeks to assess her response to Tremfya.  Psoriasis: She has noticed  improvement in her plaques of psoriasis on her elbows, hands, and feet since starting on methotrexate..  She still has  some dry scaling and patches of erythema on the extensor surface of both elbows, volar surface of right wrist, and both hands.  She uses triamcinolone 0.1% cream topically twice daily as needed.  She will be discontinuing methotrexate and starting on Tremfya, which will hopefully cause better skin clearance.   Counseled patient that Tremfya is a IL-23 inhibitor.  Counseled patient on purpose, proper use, and adverse effects of Tremfya.  Reviewed the most common adverse effects including infection, URTIs, injection site reactions, nausea/diarrhea, and recurrence of tinea and HSV infections Counseled patient that Tremfya should be held for infection and prior to scheduled surgery.  Recommend annual influenza, PCV 15 or PCV20 or Pneumovax 23, and Shingrix as indicated.  Reviewed the importance of regular labs while on Tremfya therapy.  Will monitor CBC and CMP 1 month after starting and then every 3 months routinely thereafter. Will monitor TB gold annually. Standing orders placed.  Provided patient with medication education material and answered all questions.  Patient voiced understanding.  Patient consented to [facility].  Will upload consent into the media tab.  Reviewed storage instructions of Tremfya.  Advised initial injection must be administered in office.  Patient verbalized understanding.  Will apply for Tremfya through patient's insurance and update when we receive a response.  Dose will be Tremfya 100mg  mg on weeks 0 and 4 then every 8 weeks thereafter.  Prescription pending lab results and insurance approval.   High risk medication use -Applying for tremfya.  She was advised to discontinue methotrexate since she is not willing to retry taking folic acid as prescribed.  According to the patient she developed increased reflux while taking folic acid even after trying to reduce the dose  to 1 mg daily.  CBC and CMP updated on 06/16/20. She will return for lab work in 1 month then every 3 months. Standing orders for CBC and CMP are in place. TB gold negative on 03/19/20.    She has not had any recent infections.  She was advised to hold tremfya if she develops any signs or symptoms of an infection and to resume once the infection has completely cleared.    Primary osteoarthritis of both hands:  She has tenderness over all PIP and DIP joints.   Chronic right SI joint pain - X-ray was unremarkable.  She has ongoing tenderness over the right SI joint on examination today.  Plantar fasciitis, left - History of recurrent plantar fasciitis for the last 3 years.  She has undergone cortisone injections in the past.   Trochanteric bursitis of both hips: She continues have occasional discomfort in both hips.  She has some tenderness palpation over the right trochanter bursa.  Primary osteoarthritis of both feet - Osteoarthritis was noted on the x-rays.  History of right ankle joint fracture in the past.  Arthropathy of lumbar facet joint - History of lower back painx 10 years.  History of right-sided sciatica.  X-ray showed mild facet joint arthropathy.  No midline spinal tenderness on examination today.  Vitamin D deficiency - 03/27/20: vitamin D 13.45.  She is taking vitamin D 50,000 units once weekly.  Other medical conditions are listed as follows:   Prediabetes  History of gastroesophageal reflux (GERD)  History of hyperlipidemia  Lobular carcinoma in situ (LCIS) of left breast  History of hypothyroidism  Hx of migraines  Orders: No orders of the defined types were placed in this encounter.  Meds ordered this encounter  Medications   predniSONE (DELTASONE) 5 MG  tablet    Sig: Take 4 tablets by mouth daily x4 days, 3 tablets by mouth daily x4 days, 2 tablets by mouth daily x4 days, 1 tablet by mouth daily x4 days.    Dispense:  40 tablet    Refill:  0        Follow-Up Instructions: Return in about 8 weeks (around 09/23/2020) for Psoriatic arthritis, Osteoarthritis.   Erica Neas, PA-C  Note - This record has been created using Dragon software.  Chart creation errors have been sought, but may not always  have been located. Such creation errors do not reflect on  the standard of medical care.

## 2020-07-25 ENCOUNTER — Other Ambulatory Visit: Payer: Self-pay

## 2020-07-29 ENCOUNTER — Other Ambulatory Visit: Payer: Self-pay

## 2020-07-29 ENCOUNTER — Encounter: Payer: Self-pay | Admitting: Physician Assistant

## 2020-07-29 ENCOUNTER — Other Ambulatory Visit (HOSPITAL_COMMUNITY): Payer: Self-pay

## 2020-07-29 ENCOUNTER — Telehealth: Payer: Self-pay | Admitting: Pharmacist

## 2020-07-29 ENCOUNTER — Ambulatory Visit: Payer: Commercial Managed Care - PPO | Admitting: Physician Assistant

## 2020-07-29 VITALS — BP 107/62 | HR 66 | Ht 64.0 in | Wt 154.0 lb

## 2020-07-29 DIAGNOSIS — M722 Plantar fascial fibromatosis: Secondary | ICD-10-CM

## 2020-07-29 DIAGNOSIS — L4050 Arthropathic psoriasis, unspecified: Secondary | ICD-10-CM | POA: Diagnosis not present

## 2020-07-29 DIAGNOSIS — M47816 Spondylosis without myelopathy or radiculopathy, lumbar region: Secondary | ICD-10-CM

## 2020-07-29 DIAGNOSIS — M19071 Primary osteoarthritis, right ankle and foot: Secondary | ICD-10-CM

## 2020-07-29 DIAGNOSIS — M19072 Primary osteoarthritis, left ankle and foot: Secondary | ICD-10-CM

## 2020-07-29 DIAGNOSIS — Z8669 Personal history of other diseases of the nervous system and sense organs: Secondary | ICD-10-CM

## 2020-07-29 DIAGNOSIS — Z79899 Other long term (current) drug therapy: Secondary | ICD-10-CM | POA: Diagnosis not present

## 2020-07-29 DIAGNOSIS — G8929 Other chronic pain: Secondary | ICD-10-CM

## 2020-07-29 DIAGNOSIS — M19042 Primary osteoarthritis, left hand: Secondary | ICD-10-CM

## 2020-07-29 DIAGNOSIS — D0502 Lobular carcinoma in situ of left breast: Secondary | ICD-10-CM

## 2020-07-29 DIAGNOSIS — Z8719 Personal history of other diseases of the digestive system: Secondary | ICD-10-CM

## 2020-07-29 DIAGNOSIS — M19041 Primary osteoarthritis, right hand: Secondary | ICD-10-CM | POA: Diagnosis not present

## 2020-07-29 DIAGNOSIS — E559 Vitamin D deficiency, unspecified: Secondary | ICD-10-CM

## 2020-07-29 DIAGNOSIS — L409 Psoriasis, unspecified: Secondary | ICD-10-CM

## 2020-07-29 DIAGNOSIS — Z8639 Personal history of other endocrine, nutritional and metabolic disease: Secondary | ICD-10-CM

## 2020-07-29 DIAGNOSIS — R7303 Prediabetes: Secondary | ICD-10-CM

## 2020-07-29 DIAGNOSIS — M533 Sacrococcygeal disorders, not elsewhere classified: Secondary | ICD-10-CM

## 2020-07-29 DIAGNOSIS — M7061 Trochanteric bursitis, right hip: Secondary | ICD-10-CM

## 2020-07-29 DIAGNOSIS — M7062 Trochanteric bursitis, left hip: Secondary | ICD-10-CM

## 2020-07-29 MED ORDER — PREDNISONE 5 MG PO TABS: ORAL_TABLET | ORAL | 0 refills | Status: DC

## 2020-07-29 NOTE — Progress Notes (Signed)
Pharmacy Note  Subjective: Patient presents today to [facility] for follow up office visit.  Patient seen by the pharmacist for counseling on Tremfya for psoriatic arthritis and plaque psoriasis.  Prior therapy includes:methotrexate 20mg  weekly. She was unable to tolerate folic acid and stopped taking it. Decision was made to stop methotrexate for safety since patient does not want to re-trial folic acid. Takes methotrexate on Saturdays.   Objective:  CBC    Component Value Date/Time   WBC 6.8 06/16/2020 1405   RBC 4.44 06/16/2020 1405   HGB 14.0 06/16/2020 1405   HGB 12.8 11/29/2019 0843   HGB 13.4 11/22/2016 1228   HCT 41.5 06/16/2020 1405   HCT 40.3 11/22/2016 1228   PLT 274 06/16/2020 1405   PLT 239 11/29/2019 0843   PLT 269 11/22/2016 1228   MCV 93.5 06/16/2020 1405   MCV 95.0 11/22/2016 1228   MCH 31.5 06/16/2020 1405   MCHC 33.7 06/16/2020 1405   RDW 13.2 06/16/2020 1405   RDW 12.5 11/22/2016 1228   LYMPHSABS 2,115 06/16/2020 1405   LYMPHSABS 2.2 11/22/2016 1228   MONOABS 0.5 03/27/2020 0947   MONOABS 0.6 11/22/2016 1228   EOSABS 156 06/16/2020 1405   EOSABS 0.2 11/22/2016 1228   BASOSABS 41 06/16/2020 1405   BASOSABS 0.1 11/22/2016 1228     CMP     Component Value Date/Time   NA 138 06/16/2020 1405   NA 141 11/22/2016 1228   K 4.8 06/16/2020 1405   K 4.2 11/22/2016 1228   CL 101 06/16/2020 1405   CO2 29 06/16/2020 1405   CO2 28 11/22/2016 1228   GLUCOSE 97 06/16/2020 1405   GLUCOSE 97 11/22/2016 1228   BUN 11 06/16/2020 1405   BUN 9.1 11/22/2016 1228   CREATININE 0.65 06/16/2020 1405   CREATININE 0.8 11/22/2016 1228   CALCIUM 9.7 06/16/2020 1405   CALCIUM 9.4 11/22/2016 1228   PROT 6.7 06/16/2020 1405   PROT 7.1 11/22/2016 1228   ALBUMIN 4.1 03/27/2020 0947   ALBUMIN 3.5 11/22/2016 1228   AST 23 06/16/2020 1405   AST 19 11/29/2019 0843   AST 23 11/22/2016 1228   ALT 24 06/16/2020 1405   ALT 19 11/29/2019 0843   ALT 27 11/22/2016  1228   ALKPHOS 59 03/27/2020 0947   ALKPHOS 69 11/22/2016 1228   BILITOT 0.5 06/16/2020 1405   BILITOT 0.3 11/29/2019 0843   BILITOT 0.42 11/22/2016 1228   GFRNONAA 99 06/16/2020 1405   GFRAA 115 06/16/2020 1405     Baseline Immunosuppressant Therapy Labs TB GOLD Quantiferon TB Gold Latest Ref Rng & Units 03/19/2020  Quantiferon TB Gold Plus NEGATIVE NEGATIVE   Hepatitis Panel Hepatitis Latest Ref Rng & Units 03/19/2020  Hep B Surface Ag NON-REACTI NON-REACTIVE  Hep B IgM NON-REACTI NON-REACTIVE  Hep C Ab NON-REACTI -  Hep C Ab NON-REACTI -   HIV Lab Results  Component Value Date   HIV NON-REACTIVE 03/19/2020   HIV NONREACTIVE 05/02/2015   Immunoglobulins Immunoglobulin Electrophoresis Latest Ref Rng & Units 03/19/2020  IgA  47 - 310 mg/dL 40(L)  IgG 600 - 1,640 mg/dL 955  IgM 50 - 300 mg/dL 197   SPEP Serum Protein Electrophoresis Latest Ref Rng & Units 06/16/2020  Total Protein 6.1 - 8.1 g/dL 6.7  Albumin 3.8 - 4.8 g/dL -  Alpha-1 0.2 - 0.3 g/dL -  Alpha-2 0.5 - 0.9 g/dL -  Beta Globulin 0.4 - 0.6 g/dL -  Beta 2 0.2 - 0.5 g/dL -  Gamma Globulin 0.8 - 1.7 g/dL -    Chest x-ray: 03/19/20 - no active cardiopulmonary disease  Assessment/Plan:   Counseled patient that Tremfya is a IL-23 inhibitor.  Counseled patient on purpose, proper use, and adverse effects of Tremfya.  Reviewed the most common adverse effects including infection, URTIs, injection site reactions, nausea/diarrhea, and recurrence of tinea and HSV infections Counseled patient that Tremfya should be held for infection and prior to scheduled surgery.   Has received 4 COVID19 vaccines. UTD on zoster and influenza vaccine She has not yet received pneumonia vaccine but has f/u with PCP in Oct 2022 and will plan to discuss at that visit  Reviewed the importance of regular labs while on Tremfya therapy.  Will monitor CBC and CMP 1 month after starting and then every 3 months routinely thereafter. Will monitor TB  gold annually. Standing orders placed.  Provided patient with medication education material and answered all questions.  Patient voiced understanding.  Patient consented to [facility].  Will upload consent into the media tab.  Reviewed storage instructions of Tremfya.  Advised initial injection must be administered in office.  Patient verbalized understanding.  Will apply for Tremfya through patient's insurance and update when we receive a response.  Dose will be Tremfya 100mg  mg on weeks 0 and 4 then every 8 weeks thereafter.  Prescription pending lab results and insurance approval.   She will stop methotrexate and has been prescribed prednisone taper pack  Knox Saliva, PharmD, MPH Clinical Pharmacist (Rheumatology and Pulmonology)

## 2020-07-29 NOTE — Patient Instructions (Signed)
Standing Labs We placed an order today for your standing lab work.   Please have your standing labs drawn in 1 month then every 3 months   If possible, please have your labs drawn 2 weeks prior to your appointment so that the provider can discuss your results at your appointment.  Please note that you may see your imaging and lab results in [facility] before we have reviewed them. We may be awaiting multiple results to interpret others before contacting you. Please allow our office up to 72 hours to thoroughly review all of the results before contacting the office for clarification of your results.  We have open lab daily: Monday through Thursday from 1:30-4:30 PM and Friday from 1:30-4:00 PM at the office of Dr. Bo Merino, [facility].   Please be advised, all patients with office appointments requiring lab work will take precedent over walk-in lab work.  If possible, please come for your lab work on Monday and Friday afternoons, as you may experience shorter wait times. The office is located at [address], [place], [place], [place] [address] No appointment is necessary.   Labs are drawn by [facility]. Please bring your co-pay at the time of your lab draw.  You may receive a bill from [facility] for your lab work.  If you wish to have your labs drawn at another location, please call the office 24 hours in advance to send orders.  If you have any questions regarding directions or hours of operation,  please call [phone].   As a reminder, please drink plenty of water prior to coming for your lab work. Thanks!   Guselkumab injection What is this medication? GUSELKUMAB (goo ZELK ue mab) is used to treat plaque psoriasis and psoriaticarthritis. This medicine may be used for other purposes; ask your health care provider orpharmacist if you have questions. COMMON BRAND NAME(S): Tremfya What should I tell my care team before I take this medication? They need to know if you  have any of these conditions: immune system problems infection (especially a virus infection such as chickenpox, cold sores, or herpes) or history of infections recently received or scheduled to receive a vaccine tuberculosis, a positive skin test for tuberculosis, or have recently been in close contact with someone who has tuberculosis an unusual or allergic reaction to guselkumab, other medicines, foods, dyes, or preservatives pregnant or trying to get pregnant breast-feeding How should I use this medication? This medicine is for injection under the skin. It may be administered by a healthcare professional in a hospital or clinic setting or at home. If you get this medicine at home, you will be taught how to prepare and give this medicine. Use exactly as directed. Take your medicine at regular intervals. Donot take your medicine more often than directed. It is important that you put your used injectors, needles and syringes in a special sharps container. Do not put them in a trash can. If you do not have asharps container, call your pharmacist or healthcare provider to get one. A special MedGuide will be given to you by the pharmacist with eachprescription and refill. Be sure to read this information carefully each time. Talk to your pediatrician regarding the use of this medicine in children.Special care may be needed. Overdosage: If you think you have taken too much of this medicine contact apoison control center or emergency room at once. NOTE: This medicine is only for you. Do not share this medicine with others. What if I miss  a dose? It is important not to miss your dose. Call your doctor of health care professional if you are unable to keep an appointment. If you give yourself the medicine and you miss a dose, take it as soon as you can. Then, take your nextdose at your regular scheduled time. What may interact with this medication? Do not take this medicine with any of the following  medications: live virus vaccines This medicine may also interact with the following medications: amoxapine certain medicines for depression, anxiety, or psychotic disturbances like amitriptyline, clomipramine, desipramine, doxepin, imipramine, maprotiline, nortriptyline, protriptyline, trimipramine codeine inactivated vaccines methadone pimozide thioridazine This list may not describe all possible interactions. Give your health care provider a list of all the medicines, herbs, non-prescription drugs, or dietary supplements you use. Also tell them if you smoke, drink alcohol, or use illegaldrugs. Some items may interact with your medicine. What should I watch for while using this medication? Your condition will be monitored carefully while you are receiving this medicine. Tell your doctor or healthcare professional if your symptoms do notstart to get better or if they get worse. You will be tested for tuberculosis (TB) before you start this medicine. If your doctor prescribes any medicine for TB, you should start taking the TB medicine before starting this medicine. Make sure to finish the full course ofTB medicine. Call your doctor or health care professional if you get a cold or other infection while receiving this medicine. Do not treat yourself. This medicinemay decrease your body's ability to fight infection. What side effects may I notice from receiving this medication? Side effects that you should report to your doctor or health care professionalas soon as possible: allergic reactions like skin rash, itching or hives, swelling of the face, lips, or tongue breathing problems chest pain or chest tightness dizziness, feeling faint or lightheaded signs and symptoms of infection like fever or chills; cough; sore throat; pain or trouble passing urine Side effects that usually do not require medical attention (report these toyour doctor or health care professional if they continue or are  bothersome): diarrhea headache joint pain pain, redness, irritation at site where injected This list may not describe all possible side effects. Call your doctor for medical advice about side effects. You may report side effects to FDA at1-800-FDA-1088. Where should I keep my medication? Keep out of the reach of children. Store unopened syringes or injectors in a refrigerator between 2 to 8 degrees C (36 to 46 degrees F). Keep in the original container until ready for use. Protect from light. Do not freeze. Do not shake. Prior to use, remove the syringe or injector from the refrigerator and use after 30 minutes at room temperature. Throw away any unused medicine after the expiration date on thelabel. NOTE: This sheet is a summary. It may not cover all possible information. If you have questions about this medicine, talk to your doctor, pharmacist, orhealth care provider.  2022 Elsevier/Gold Standard (2018-08-30 15:10:10)

## 2020-07-29 NOTE — Telephone Encounter (Signed)
Please start Tremfya BIV.  Dose: 100mg  at Week 0, Week 4, and then every 8 weeks thereater  Dx: PsA (L40.50), psoriasis (L40.9)  Previously tried/current therapies: Methotrexate - unable to tolerate folic acid d/t GI upset but likely d/t MTX as well  She is naive to biologic DMARDs  Knox Saliva, PharmD, MPH Clinical Pharmacist (Rheumatology and Pulmonology)

## 2020-07-29 NOTE — Telephone Encounter (Signed)
Submitted PA request to [facility] via PromptPA for [facility] Will update once we receive a determination.  Check Status at: [email or web address] Prior Auth (EOC) ID:  [id number] Member ID: [id number]

## 2020-07-30 ENCOUNTER — Other Ambulatory Visit (HOSPITAL_COMMUNITY): Payer: Self-pay

## 2020-07-30 NOTE — Telephone Encounter (Addendum)
ATC patient to schedule Tremfya new start. Left VM requesting return call.  Please schedule patient for new start visit on pharmacy clinic schedule slot that works for patient if she returns call  Knox Saliva, PharmD, MPH Clinical Pharmacist (Rheumatology and Pulmonology)

## 2020-07-30 NOTE — Telephone Encounter (Signed)
Received notification from [facility] regarding a prior authorization for [facility]. Authorization has been APPROVED from 07/29/20 to 07/28/21.   Authorization # [id number] Phone # [phone]  Enrolled pt into [place] card: BIN: [id number] Group: [id number] Member ID: [id number]  Must use [place]  Knox Saliva, PharmD, MPH Clinical Pharmacist (Rheumatology and Pulmonology)

## 2020-07-31 NOTE — Telephone Encounter (Signed)
Patient scheduled for Tremfya new start on 08/04/20 @9 :30am.  Knox Saliva, PharmD, MPH Clinical Pharmacist (Rheumatology and Pulmonology)

## 2020-08-04 ENCOUNTER — Ambulatory Visit: Payer: Commercial Managed Care - PPO | Admitting: Pharmacist

## 2020-08-04 ENCOUNTER — Other Ambulatory Visit: Payer: Self-pay

## 2020-08-04 DIAGNOSIS — Z7189 Other specified counseling: Secondary | ICD-10-CM

## 2020-08-04 DIAGNOSIS — Z79899 Other long term (current) drug therapy: Secondary | ICD-10-CM

## 2020-08-04 DIAGNOSIS — L409 Psoriasis, unspecified: Secondary | ICD-10-CM

## 2020-08-04 DIAGNOSIS — L4050 Arthropathic psoriasis, unspecified: Secondary | ICD-10-CM

## 2020-08-04 MED ORDER — TREMFYA 100 MG/ML ~~LOC~~ SOAJ: SUBCUTANEOUS | 0 refills | Status: DC

## 2020-08-04 NOTE — Patient Instructions (Signed)
Your next Tremfya dose is due on 09/01/20, 10/27/20, and every 8 weeks thereafter  Your prescription will be shipped from [facility]. Their phone number is [phone]  Please call to schedule shipment and confirm address. They will mail medication to your home.  Your copay should be affordable. If you call the pharmacy and it is not affordable, please double-check that they are billing through your copay card as secondary coverage. That copay card information is: BIN: [id number] Group: [id number] ID: [id number]  Labs are due in 1 month then every 3 months.  Remember the 5 C's: COUNTER - leave on the counter at least 30 minutes but up to overnight to bring medication to room temperature. This may help prevent stinging COLD - place something cold (like an ice gel pack or cold water bottle) on the injection site just before cleansing with alcohol. This may help reduce pain CLARITIN - use Claritin (generic name is loratadine) for the first two weeks of treatment or the day of, the day before, and the day after injecting. This will help to minimize injection site reactions CORTISONE CREAM - apply if injection site is irritated and itching CALL ME - if injection site reaction is bigger than the size of your fist, looks infected, blisters, or if you develop hives

## 2020-08-04 NOTE — Progress Notes (Signed)
Pharmacy Note  Subjective:   Erica Woods presents to clinic today to receive first dose of Tremfya for psoriatic arthritis/psoriasis. She was previously taking methotrexate (last dose was on 6/62/94) with folic acid but was unable to tolerate folic acid. Therefore she was taking methotrexate without folic acid supplementation - decision was made at office visit on 07/29/20 to change to Tremfya from a safety standpoint. She is naive to biologic DMARDs and naive to injectable medications.  Patient running a fever or have signs/symptoms of infection? No  Patient currently on antibiotics for the treatment of infection? No  Patient have any upcoming invasive procedures/surgeries? No  Objective: CMP     Component Value Date/Time   NA 138 06/16/2020 1405   NA 141 11/22/2016 1228   K 4.8 06/16/2020 1405   K 4.2 11/22/2016 1228   CL 101 06/16/2020 1405   CO2 29 06/16/2020 1405   CO2 28 11/22/2016 1228   GLUCOSE 97 06/16/2020 1405   GLUCOSE 97 11/22/2016 1228   BUN 11 06/16/2020 1405   BUN 9.1 11/22/2016 1228   CREATININE 0.65 06/16/2020 1405   CREATININE 0.8 11/22/2016 1228   CALCIUM 9.7 06/16/2020 1405   CALCIUM 9.4 11/22/2016 1228   PROT 6.7 06/16/2020 1405   PROT 7.1 11/22/2016 1228   ALBUMIN 4.1 03/27/2020 0947   ALBUMIN 3.5 11/22/2016 1228   AST 23 06/16/2020 1405   AST 19 11/29/2019 0843   AST 23 11/22/2016 1228   ALT 24 06/16/2020 1405   ALT 19 11/29/2019 0843   ALT 27 11/22/2016 1228   ALKPHOS 59 03/27/2020 0947   ALKPHOS 69 11/22/2016 1228   BILITOT 0.5 06/16/2020 1405   BILITOT 0.3 11/29/2019 0843   BILITOT 0.42 11/22/2016 1228   GFRNONAA 99 06/16/2020 1405   GFRAA 115 06/16/2020 1405    CBC    Component Value Date/Time   WBC 6.8 06/16/2020 1405   RBC 4.44 06/16/2020 1405   HGB 14.0 06/16/2020 1405   HGB 12.8 11/29/2019 0843   HGB 13.4 11/22/2016 1228   HCT 41.5 06/16/2020 1405   HCT 40.3 11/22/2016 1228   PLT 274 06/16/2020 1405   PLT 239 11/29/2019 0843    PLT 269 11/22/2016 1228   MCV 93.5 06/16/2020 1405   MCV 95.0 11/22/2016 1228   MCH 31.5 06/16/2020 1405   MCHC 33.7 06/16/2020 1405   RDW 13.2 06/16/2020 1405   RDW 12.5 11/22/2016 1228   LYMPHSABS 2,115 06/16/2020 1405   LYMPHSABS 2.2 11/22/2016 1228   MONOABS 0.5 03/27/2020 0947   MONOABS 0.6 11/22/2016 1228   EOSABS 156 06/16/2020 1405   EOSABS 0.2 11/22/2016 1228   BASOSABS 41 06/16/2020 1405   BASOSABS 0.1 11/22/2016 1228    Baseline Immunosuppressant Therapy Labs TB GOLD Quantiferon TB Gold Latest Ref Rng & Units 03/19/2020  Quantiferon TB Gold Plus NEGATIVE NEGATIVE   Hepatitis Panel Hepatitis Latest Ref Rng & Units 03/19/2020  Hep B Surface Ag NON-REACTI NON-REACTIVE  Hep B IgM NON-REACTI NON-REACTIVE  Hep C Ab NON-REACTI -  Hep C Ab NON-REACTI -   HIV Lab Results  Component Value Date   HIV NON-REACTIVE 03/19/2020   HIV NONREACTIVE 05/02/2015   Immunoglobulins Immunoglobulin Electrophoresis Latest Ref Rng & Units 03/19/2020  IgA  47 - 310 mg/dL 40(L)  IgG 600 - 1,640 mg/dL 955  IgM 50 - 300 mg/dL 197   SPEP Serum Protein Electrophoresis Latest Ref Rng & Units 06/16/2020  Total Protein 6.1 - 8.1 g/dL 6.7  Albumin  3.8 - 4.8 g/dL -  Alpha-1 0.2 - 0.3 g/dL -  Alpha-2 0.5 - 0.9 g/dL -  Beta Globulin 0.4 - 0.6 g/dL -  Beta 2 0.2 - 0.5 g/dL -  Gamma Globulin 0.8 - 1.7 g/dL -    Chest x-ray: 03/19/20 - no active cardiopulmonary disease  Assessment/Plan:  Counseled patient that Tremfya is a IL-23 inhibitor.  Counseled patient on purpose, proper use, and adverse effects of Tremfya.  Reviewed the most common adverse effects including infection, URTIs, injection site reactions, nausea/diarrhea, and recurrence of tinea and HSV infections Counseled patient that Tremfya should be held for infection and prior to scheduled surgery.   She has received [place] vaccines. She is UTD on zoster and influenza vaccine. She has not yet received pneumonia vaccine but will plan  to discuss this with her PCP at f/u appointment in October.  Reviewed the importance of regular labs while on Tremfya therapy.  Will monitor CBC and CMP 1 month after starting and then every 3 months routinely thereafter. Will monitor TB gold annually. Standing orders for CBC and CMP remain in place.  Provided patient with medication education material and answered all questions.  Patient voiced understanding.  Patient consented to Tremfya at [place] on 07/29/20  Demonstrated proper injection technique with Tremfya demo device  Patient able to demonstrate proper injection technique using the teach back method.  Patient self injected in the right thigh with:  Sample Medication: Tremfya 100mg /m NDC: [address]-34 Lot: [id number] Expiration: 11/2021  Patient tolerated well.  Observed for 30 mins in office for adverse reaction and none noted.  Her dose will be Tremfya 100mg  subcut at Week 0 (administered in clinic today), Week 4, and every 8 weeks thereafter   Patient is to return in 1 month for labs and 6-8 weeks for follow-up appointment.  Standing orders placed.   Tremfya approved through insurance.  Prescription sent to [place] with copay card information. Patient provided with copay card information on AVS and advised to provide to pharmacy if copay is more than $5.  All questions encouraged and answered.  Instructed patient to call with any further questions or concerns.  Knox Saliva, PharmD, MPH Clinical Pharmacist (Rheumatology and Pulmonology)  08/04/2020 8:06 AM

## 2020-08-26 ENCOUNTER — Other Ambulatory Visit: Payer: Self-pay | Admitting: Family Medicine

## 2020-08-26 ENCOUNTER — Other Ambulatory Visit: Payer: Self-pay | Admitting: Oncology

## 2020-08-26 ENCOUNTER — Other Ambulatory Visit: Payer: Self-pay | Admitting: Optometry

## 2020-08-26 DIAGNOSIS — Z1231 Encounter for screening mammogram for malignant neoplasm of breast: Secondary | ICD-10-CM

## 2020-08-28 ENCOUNTER — Ambulatory Visit
Admission: RE | Admit: 2020-08-28 | Discharge: 2020-08-28 | Disposition: A | Discharge status: Home / Self Care | Payer: Commercial Managed Care - PPO | Source: Ambulatory Visit | Attending: Family Medicine | Admitting: Family Medicine

## 2020-08-28 ENCOUNTER — Other Ambulatory Visit: Payer: Self-pay

## 2020-08-28 DIAGNOSIS — Z1231 Encounter for screening mammogram for malignant neoplasm of breast: Secondary | ICD-10-CM

## 2020-09-03 ENCOUNTER — Other Ambulatory Visit: Payer: Self-pay

## 2020-09-03 DIAGNOSIS — Z79899 Other long term (current) drug therapy: Secondary | ICD-10-CM

## 2020-09-03 DIAGNOSIS — L4050 Arthropathic psoriasis, unspecified: Secondary | ICD-10-CM

## 2020-09-03 LAB — COMPLETE METABOLIC PANEL WITH GFR
AG Ratio: 1.8 (calc) (ref 1.0–2.5)
ALT: 14 U/L (ref 6–29)
AST: 15 U/L (ref 10–35)
Albumin: 4.3 g/dL (ref 3.6–5.1)
Alkaline phosphatase (APISO): 77 U/L (ref 37–153)
BUN: 11 mg/dL (ref 7–25)
CO2: 30 mmol/L (ref 20–32)
Calcium: 9.8 mg/dL (ref 8.6–10.4)
Chloride: 102 mmol/L (ref 98–110)
Creat: 0.69 mg/dL (ref 0.50–1.03)
Globulin: 2.4 g/dL (calc) (ref 1.9–3.7)
Glucose, Bld: 98 mg/dL (ref 65–99)
Potassium: 4.7 mmol/L (ref 3.5–5.3)
Sodium: 139 mmol/L (ref 135–146)
Total Bilirubin: 0.6 mg/dL (ref 0.2–1.2)
Total Protein: 6.7 g/dL (ref 6.1–8.1)
eGFR: 102 mL/min/{1.73_m2} (ref >=60)

## 2020-09-03 LAB — CBC WITH DIFFERENTIAL/PLATELET
Absolute Monocytes: 637 cells/uL (ref 200–950)
Basophils Absolute: 39 cells/uL (ref 0–200)
Basophils Relative: 0.6 %
Eosinophils Absolute: 254 cells/uL (ref 15–500)
Eosinophils Relative: 3.9 %
HCT: 40.7 % (ref 35.0–45.0)
Hemoglobin: 13.4 g/dL (ref 11.7–15.5)
Lymphs Abs: 1970 cells/uL (ref 850–3900)
MCH: 30.7 pg (ref 27.0–33.0)
MCHC: 32.9 g/dL (ref 32.0–36.0)
MCV: 93.3 fL (ref 80.0–100.0)
MPV: 11.1 fL (ref 7.5–12.5)
Monocytes Relative: 9.8 %
Neutro Abs: 3601 cells/uL (ref 1500–7800)
Neutrophils Relative %: 55.4 %
Platelets: 294 10*3/uL (ref 140–400)
RBC: 4.36 10*6/uL (ref 3.80–5.10)
RDW: 13.8 % (ref 11.0–15.0)
Total Lymphocyte: 30.3 %
WBC: 6.5 10*3/uL (ref 3.8–10.8)

## 2020-09-09 NOTE — Progress Notes (Signed)
Office Visit Note  Patient: Erica Woods             Date of Birth: February 29, 1965           MRN: [id number]             PCP: Leone Haven, MD Referring: Leone Haven, MD Visit Date: 09/23/2020 Occupation: '@GUAROCC'$ @  Subjective:  Medication monitoring   History of Present Illness: Erica Woods is a 55 y.o. female with history of psoriatic arthritis and osteoarthritis.  She was started on Tremfya on 08/04/2020.  Her second injection was administered on 08/25/20. She states she forgot to take Claritin daily before, day of, and day after her last injection.  She states that about 24 hours after the injection she noticed a small area of redness and itching which was about the width of her 2 fingers.  She states that she applied ice and the injection site reaction resolved within 10 minutes.  She denies any other side effects since starting on Tremfya.  She has not had any recent infections.  She states that she has noticed about a 40% improvement in her joint pain and inflammation since starting on Tremfya. She is having less discomfort in both knee joints.  She does continue to have pain when climbing steps.  She woke up this morning with pain in her right ankle joint and states that it feels as though there is a wedge inside of it.  She states that she has been starting to walk 30 minutes 4 to 5 days a week and yesterday she did not walk so she is wondering if her right ankle pain and stiffness is secondary to being more sedentary.  She continues to have persistent pain in her right SI joint.  Patient reports that she initially noticed some improvement in her psoriasis after the second Tremfya injection.  She still has patches of psoriasis overlying the anterior aspect of both wrists and the dorsal aspect of her hands.  She also has psoriasis on both elbows and feet.  She has been using an alcohol-based hand cleanser at work which she feels is contributing to some of the scaling.  She has been  using triamcinolone cream topically as needed as well as a daily moisturizer for psoriasis.  Overall she remains hopeful that she will continue to notice improvement while on tremfya.      Activities of Daily Living:  Patient reports morning stiffness for 30 minutes.   Patient Denies nocturnal pain.  Difficulty dressing/grooming: Denies Difficulty climbing stairs: Reports Difficulty getting out of chair: Denies Difficulty using hands for taps, buttons, cutlery, and/or writing: Denies  Review of Systems  Constitutional:  Negative for fatigue.  HENT:  Negative for mouth sores, mouth dryness and nose dryness.   Eyes:  Negative for pain, itching and dryness.  Respiratory:  Negative for shortness of breath and difficulty breathing.   Cardiovascular:  Negative for chest pain and palpitations.  Gastrointestinal:  Negative for blood in stool, constipation and diarrhea.  Endocrine: Negative for increased urination.  Genitourinary:  Negative for difficulty urinating.  Musculoskeletal:  Positive for morning stiffness. Negative for joint pain, joint pain, joint swelling, myalgias, muscle tenderness and myalgias.  Skin:  Negative for color change, rash and redness.  Allergic/Immunologic: Negative for susceptible to infections.  Neurological:  Negative for dizziness, numbness, headaches, memory loss and weakness.  Hematological:  Negative for bruising/bleeding tendency.  Psychiatric/Behavioral:  Negative for confusion.    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   Encounter for general adult medical examination with abnormal findings 05/02/2015   Migraines 11/25/2014   GERD (gastroesophageal reflux disease) 11/25/2014   Atypical lobular hyperplasia [facility]) of left breast 11/22/2014    Past Medical History:  Diagnosis Date   Allergic rhinitis    Breast cancer (HCC)    Chickenpox    GERD (gastroesophageal reflux disease)    Headache    Hemorrhoids    History of blood transfusion    Hypercholesteremia    Hyperthyroidism     Family History  Problem Relation Age of Onset   Diabetes Mother    Heart attack Father    Hypertension Other    Diabetes Other    Diabetes Sister    Cancer Sister    Tuberculosis Brother    Irregular heart beat Brother    Parkinson's disease Sister    Healthy Son    Irregular heart beat Son    Healthy Son    Past Surgical History:  Procedure Laterality Date   ABDOMINAL SURGERY     APPENDECTOMY     BALLOON DILATION N/A 02/04/2017   Procedure: Larrie Kass DILATION;  Surgeon: [name], MD;  Location: [facility] ENDOSCOPY;  Service: Gastroenterology;  Laterality: N/A;   BREAST BIOPSY Left 10/02/2014   Procedure: LEFT BREAST BIOPSY AFTER NEEDLE LOCALIZATION X TWO;  Surgeon: [name], MD;  Location: [facility] ORS;  Service: General;  Laterality: Left;   BREAST LUMPECTOMY Left 2016   benign   COLONOSCOPY N/A 10/23/2014   Procedure: COLONOSCOPY;  Surgeon: Rogene Houston, MD;  Location: [facility] ENDO SUITE;  Service: Endoscopy;  Laterality: N/A;  730   ESOPHAGOGASTRODUODENOSCOPY (EGD) WITH PROPOFOL N/A 02/04/2017   Procedure: ESOPHAGOGASTRODUODENOSCOPY (EGD) WITH PROPOFOL;  Surgeon: [name], MD;  Location: [facility] ENDOSCOPY;  Service: Gastroenterology;  Laterality: N/A;   LAPAROSCOPIC APPENDECTOMY  03/02/2012   Procedure: APPENDECTOMY LAPAROSCOPIC;  Surgeon: [name], MD;  Location: [facility] ORS;  Service: General;  Laterality: N/A;   OVARIAN CYST REMOVAL Left    TUBAL LIGATION     WISDOM TOOTH EXTRACTION  Bilateral    Social History   Social History Narrative   Not on file   Immunization History  Administered Date(s) Administered   Influenza,inj,Quad PF,6+ Mos 11/14/2017   Influenza-Unspecified 11/17/2015, 12/12/2018, 12/03/2019   Moderna Sars-Covid-2 Vaccination 06/05/2019, 07/03/2019, 01/18/2020, 07/04/2020   Tdap 05/02/2015   Zoster Recombinat (Shingrix) 05/09/2017, 07/12/2017     Objective: Vital Signs: BP 115/65 (BP Location: Left Arm, Patient Position: Sitting, Cuff Size: Normal)   Pulse 64   Resp 15   Ht '5\' 4"'$  (1.626 m)   Wt 157 lb (71.2 kg)   LMP 10/09/2014   BMI 26.95 kg/m    Physical Exam Vitals and nursing note reviewed.  Constitutional:      Appearance: She is well-developed.  HENT:     Head: Normocephalic and atraumatic.  Eyes:     Conjunctiva/sclera: Conjunctivae normal.  Pulmonary:     Effort: Pulmonary effort is normal.  Abdominal:  Palpations: Abdomen is soft.  Musculoskeletal:     Cervical back: Normal range of motion.  Skin:    General: Skin is warm and dry.     Capillary Refill: Capillary refill takes less than 2 seconds.  Neurological:     Mental Status: She is alert and oriented to person, place, and time.  Psychiatric:        Behavior: Behavior normal.     Musculoskeletal Exam: C-spine, thoracic spine, lumbar spine have good range of motion with no discomfort.  Tenderness over the right SI joint.  Shoulder joints, elbow joints, wrist joints, MCPs, PIPs, DIPs have good range of motion with no synovitis. Complete fist formation noted.  Hip joints have good ROM with no discomfort. Tenderness over the right trochanteric bursa.  Knee joints have good ROM with no discomfort.  No warmth or effusion of knee joints. Painful ROM of the right ankle joint.  Tenderness over the right ankle.  No evidence of achilles tendonitis or plantar fasciitis.  No tenderness of MTP joints.   CDAI Exam: CDAI Score: -- Patient Global: --; Provider Global:  -- Swollen: --; Tender: -- Joint Exam 09/23/2020   No joint exam has been documented for this visit   There is currently no information documented on the homunculus. Go to the Rheumatology activity and complete the homunculus joint exam.  Investigation: No additional findings.  Imaging: MM 3D SCREEN BREAST BILATERAL  Result Date: 08/30/2020 CLINICAL DATA:  Screening. EXAM: DIGITAL SCREENING BILATERAL MAMMOGRAM WITH TOMOSYNTHESIS AND CAD TECHNIQUE: Bilateral screening digital craniocaudal and mediolateral oblique mammograms were obtained. Bilateral screening digital breast tomosynthesis was performed. The images were evaluated with computer-aided detection. COMPARISON:  Previous exam(s). ACR Breast Density Category b: There are scattered areas of fibroglandular density. FINDINGS: There are no findings suspicious for malignancy. IMPRESSION: No mammographic evidence of malignancy. A result letter of this screening mammogram will be mailed directly to the patient. RECOMMENDATION: Screening mammogram in one year. (Code:SM-B-01Y) BI-RADS CATEGORY  1: Negative. Electronically Signed   By: Ammie Ferrier M.D.   On: 08/30/2020 12:51    Recent Labs: Lab Results  Component Value Date   WBC 6.5 09/03/2020   HGB 13.4 09/03/2020   PLT 294 09/03/2020   NA 139 09/03/2020   K 4.7 09/03/2020   CL 102 09/03/2020   CO2 30 09/03/2020   GLUCOSE 98 09/03/2020   BUN 11 09/03/2020   CREATININE 0.69 09/03/2020   BILITOT 0.6 09/03/2020   ALKPHOS 59 03/27/2020   AST 15 09/03/2020   ALT 14 09/03/2020   PROT 6.7 09/03/2020   ALBUMIN 4.1 03/27/2020   CALCIUM 9.8 09/03/2020   GFRAA 115 06/16/2020   QFTBGOLDPLUS NEGATIVE 03/19/2020    Speciality Comments: MTX tabs 04/14/20-07/26/20 (stopped d/t nausea from folic acid); switched to Tremfya 08/04/20  Procedures:  No procedures performed Allergies: Gold-containing drug products   Assessment / Plan:     Visit Diagnoses: Psoriatic arthritis ([place]): She has  no synovitis or dactylitis on exam. She continues to experience some stiffness in both hands, both knees, and the right ankle joint. She was able to make a complete fist today, which is a significant improvement since her last visit on 07/29/20. She has tenderness over the right ankle but no synovitis was noted.  She has tenderness over the right SI joint.  No nocturnal pain. She has noticed about a 40% improvement in her joint pain and inflammation since starting on Tremfya on 08/04/2020.  Her second injection was administered on  08/25/2020.  She developed a very small injection site reaction 24 hours after her last injection with lasted about 10 minutes after applying ice.  She has not had any other side effects and has not had any infection since starting on Tremfya.  She initially noticed some improvement in her psoriasis after the first 2 Tremfya injections but her psoriasis seems to have flared up over the past couple of weeks. She has been using triamcinolone cream topically as needed but attributes the worsening scaling and thickening of the patches on her hands due to using an alcohol-based hand cleanser at work.  She was strongly encouraged to avoid products with fragrance or alcohol based products.  Overall she is hopeful that Tremfya will continue to improve her symptoms. She has been able to start to increase her exercise regimen and has been walking 30 minutes 4-5 days a week. She does not want to make any medication changes at this time.  She will remain on Tremfya as prescribed.  She will follow-up in the office in 2 to 3 months to reassess her response.  Psoriasis: She has some dry scaling and erythematous patches of psoriasis on the extensor surface of both elbows, volar aspect of right wrist, dorsal aspect of both hands, and dorsal aspect of several toes. The patches appear less thickened and less scaly since starting on tremfya.  She has been using an alcohol base hand cleanser at work, which is  preventing further skin clearance.  She was advised to avoid using alcohol based products and to use fragrance free moisturizers and triamcinolone cream topically as needed.   High risk medication use - Tremfya (started on 08/04/2020).  She was advised to discontinue methotrexate since she is not willing to retry taking folic acid as prescribed.   CBC and CMP were within normal limits on 09/03/2020.  She will be due to update lab work in October and every 3 months to monitor for drug toxicity.  Standing orders for CBC and CMP remain in place.  TB Gold negative on 03/19/2020. She has not had any recent infections. Discussed the importance of holding Tremfya if she develops signs or symptoms of an infection and to resume once infection has completely cleared.  Primary osteoarthritis of both hands: She has PIP and DIP thickening consistent with osteoarthritis of both hands.  No tenderness or inflammation was noted.  She has noticed improvement in her stiffness and has been able to make a complete fist.  Discussed the importance of joint protection and muscle strengthening.  Chronic right SI joint pain - X-ray was unremarkable.  She has tenderness over the right SI joint on examination today.  Plantar fasciitis, left - History of recurrent plantar fasciitis for the last 3 years.  She has undergone cortisone injections in the past.  She is not experiencing any tenderness over the plantar fascia at this time.  Trochanteric bursitis of both hips: She has tenderness palpation over bilateral trochanteric bursa, right greater than left.  She is a side sleeper which exacerbates her discomfort.  We discussed the importance of performing stretching exercises on a daily basis.  Primary osteoarthritis of both feet - Osteoarthritis was noted on the x-rays.  History of right ankle joint fracture in the past.  She presents today with increased pain and stiffness in the right ankle joint.  No synovitis was noted on  examination today.  No evidence of Achilles tendinitis or plantar fasciitis.  We discussed the use of Voltaren gel which she  can apply topically as needed for pain relief.  She plans on continuing to try to walk 30 minutes 4 to 5 days/week for exercise.  We discussed the importance of wearing proper fitting shoes.  Arthropathy of lumbar facet joint - History of lower back painx 10 years.  History of right-sided sciatica.  X-ray showed mild facet joint arthropathy.  She has observation over the right SI joint on examination today.  No midline spinal tenderness.  No symptoms radiculopathy.  Vitamin D deficiency: She is taking vitamin D 5000 units once weekly.   Other medical conditions are listed as follows:  History of hyperlipidemia  Hx of migraines  Lobular carcinoma in situ (LCIS) of left breast  Prediabetes  History of gastroesophageal reflux (GERD)  History of hypothyroidism  Orders: No orders of the defined types were placed in this encounter.  No orders of the defined types were placed in this encounter.   Follow-Up Instructions: Return in about 3 months (around 12/24/2020) for Psoriatic arthritis, Osteoarthritis.   Ofilia Neas, PA-C  Note - This record has been created using Dragon software.  Chart creation errors have been sought, but may not always  have been located. Such creation errors do not reflect on  the standard of medical care.

## 2020-09-23 ENCOUNTER — Other Ambulatory Visit: Payer: Self-pay

## 2020-09-23 ENCOUNTER — Encounter: Payer: Self-pay | Admitting: Physician Assistant

## 2020-09-23 ENCOUNTER — Ambulatory Visit: Payer: Commercial Managed Care - PPO | Admitting: Physician Assistant

## 2020-09-23 VITALS — BP 115/65 | HR 64 | Resp 15 | Ht 64.0 in | Wt 157.0 lb

## 2020-09-23 DIAGNOSIS — D0502 Lobular carcinoma in situ of left breast: Secondary | ICD-10-CM

## 2020-09-23 DIAGNOSIS — G8929 Other chronic pain: Secondary | ICD-10-CM

## 2020-09-23 DIAGNOSIS — M722 Plantar fascial fibromatosis: Secondary | ICD-10-CM

## 2020-09-23 DIAGNOSIS — M533 Sacrococcygeal disorders, not elsewhere classified: Secondary | ICD-10-CM

## 2020-09-23 DIAGNOSIS — L409 Psoriasis, unspecified: Secondary | ICD-10-CM

## 2020-09-23 DIAGNOSIS — Z79899 Other long term (current) drug therapy: Secondary | ICD-10-CM

## 2020-09-23 DIAGNOSIS — M19072 Primary osteoarthritis, left ankle and foot: Secondary | ICD-10-CM

## 2020-09-23 DIAGNOSIS — R7303 Prediabetes: Secondary | ICD-10-CM

## 2020-09-23 DIAGNOSIS — M19041 Primary osteoarthritis, right hand: Secondary | ICD-10-CM

## 2020-09-23 DIAGNOSIS — M19071 Primary osteoarthritis, right ankle and foot: Secondary | ICD-10-CM

## 2020-09-23 DIAGNOSIS — Z8719 Personal history of other diseases of the digestive system: Secondary | ICD-10-CM

## 2020-09-23 DIAGNOSIS — Z8669 Personal history of other diseases of the nervous system and sense organs: Secondary | ICD-10-CM

## 2020-09-23 DIAGNOSIS — L4050 Arthropathic psoriasis, unspecified: Secondary | ICD-10-CM

## 2020-09-23 DIAGNOSIS — M47816 Spondylosis without myelopathy or radiculopathy, lumbar region: Secondary | ICD-10-CM

## 2020-09-23 DIAGNOSIS — M19042 Primary osteoarthritis, left hand: Secondary | ICD-10-CM

## 2020-09-23 DIAGNOSIS — E559 Vitamin D deficiency, unspecified: Secondary | ICD-10-CM

## 2020-09-23 DIAGNOSIS — M7061 Trochanteric bursitis, right hip: Secondary | ICD-10-CM

## 2020-09-23 DIAGNOSIS — M7062 Trochanteric bursitis, left hip: Secondary | ICD-10-CM

## 2020-09-23 DIAGNOSIS — Z8639 Personal history of other endocrine, nutritional and metabolic disease: Secondary | ICD-10-CM

## 2020-09-23 NOTE — Patient Instructions (Signed)
Standing Labs We placed an order today for your standing lab work.   Please have your standing labs drawn in October and every 3 months   If possible, please have your labs drawn 2 weeks prior to your appointment so that the provider can discuss your results at your appointment.  Please note that you may see your imaging and lab results in MyChart before we have reviewed them. We may be awaiting multiple results to interpret others before contacting you. Please allow our office up to 72 hours to thoroughly review all of the results before contacting the office for clarification of your results.  We have open lab daily: Monday through Thursday from 1:30-4:30 PM and Friday from 1:30-4:00 PM at the office of Dr. Shaili Deveshwar, [facility].   Please be advised, all patients with office appointments requiring lab work will take precedent over walk-in lab work.  If possible, please come for your lab work on Monday and Friday afternoons, as you may experience shorter wait times. The office is located at [address], [address], [place], [place] [address] No appointment is necessary.   Labs are drawn by [facility]. Please bring your co-pay at the time of your lab draw.  You may receive a bill from [facility] for your lab work.  If you wish to have your labs drawn at another location, please call the office 24 hours in advance to send orders.  If you have any questions regarding directions or hours of operation,  please call [phone].   As a reminder, please drink plenty of water prior to coming for your lab work. Thanks!  

## 2020-11-01 ENCOUNTER — Other Ambulatory Visit: Payer: Self-pay | Admitting: Physician Assistant

## 2020-11-01 DIAGNOSIS — L409 Psoriasis, unspecified: Secondary | ICD-10-CM

## 2020-11-01 DIAGNOSIS — L4050 Arthropathic psoriasis, unspecified: Secondary | ICD-10-CM

## 2020-11-03 NOTE — Telephone Encounter (Signed)
Next Visit: 12/23/2020  Last Visit: 09/23/2020  Last Fill: 08/04/2020  [id number] arthritis   Current Dose per office note 09/23/2020: Tremfya (started on 08/04/2020)  Labs: 09/03/2020 CBC and CMP WNL  TB Gold: 04/08/2020 Neg    Okay to refill Tremfya?

## 2020-11-16 ENCOUNTER — Other Ambulatory Visit: Payer: Self-pay | Admitting: Family Medicine

## 2020-11-18 ENCOUNTER — Other Ambulatory Visit: Payer: Self-pay | Admitting: Physician Assistant

## 2020-11-18 DIAGNOSIS — L409 Psoriasis, unspecified: Secondary | ICD-10-CM

## 2020-11-18 DIAGNOSIS — L4050 Arthropathic psoriasis, unspecified: Secondary | ICD-10-CM

## 2020-11-24 ENCOUNTER — Other Ambulatory Visit: Payer: Self-pay

## 2020-11-24 ENCOUNTER — Other Ambulatory Visit (HOSPITAL_COMMUNITY)
Admission: RE | Admit: 2020-11-24 | Discharge: 2020-11-24 | Disposition: A | Discharge status: Home / Self Care | Payer: Commercial Managed Care - PPO | Source: Ambulatory Visit | Attending: Family Medicine | Admitting: Family Medicine

## 2020-11-24 ENCOUNTER — Encounter: Payer: Self-pay | Admitting: Family Medicine

## 2020-11-24 ENCOUNTER — Ambulatory Visit: Payer: Commercial Managed Care - PPO | Admitting: Family Medicine

## 2020-11-24 VITALS — BP 130/70 | HR 62 | Temp 98.1°F | Ht 64.0 in | Wt 156.2 lb

## 2020-11-24 DIAGNOSIS — Z124 Encounter for screening for malignant neoplasm of cervix: Secondary | ICD-10-CM

## 2020-11-24 DIAGNOSIS — Z0001 Encounter for general adult medical examination with abnormal findings: Secondary | ICD-10-CM

## 2020-11-24 DIAGNOSIS — Z23 Encounter for immunization: Secondary | ICD-10-CM | POA: Diagnosis not present

## 2020-11-24 DIAGNOSIS — N950 Postmenopausal bleeding: Secondary | ICD-10-CM

## 2020-11-24 DIAGNOSIS — E559 Vitamin D deficiency, unspecified: Secondary | ICD-10-CM

## 2020-11-24 DIAGNOSIS — E039 Hypothyroidism, unspecified: Secondary | ICD-10-CM

## 2020-11-24 DIAGNOSIS — E785 Hyperlipidemia, unspecified: Secondary | ICD-10-CM

## 2020-11-24 DIAGNOSIS — Z853 Personal history of malignant neoplasm of breast: Secondary | ICD-10-CM | POA: Diagnosis not present

## 2020-11-24 DIAGNOSIS — R7303 Prediabetes: Secondary | ICD-10-CM | POA: Diagnosis not present

## 2020-11-24 LAB — COMPREHENSIVE METABOLIC PANEL
ALT: 12 U/L (ref 0–35)
AST: 14 U/L (ref 0–37)
Albumin: 4.3 g/dL (ref 3.5–5.2)
Alkaline Phosphatase: 81 U/L (ref 39–117)
BUN: 9 mg/dL (ref 6–23)
CO2: 30 mEq/L (ref 19–32)
Calcium: 9.6 mg/dL (ref 8.4–10.5)
Chloride: 101 mEq/L (ref 96–112)
Creatinine, Ser: 0.71 mg/dL (ref 0.40–1.20)
GFR: 94.91 mL/min (ref >60.00)
Glucose, Bld: 101 mg/dL — ABNORMAL HIGH (ref 70–99)
Potassium: 4.5 mEq/L (ref 3.5–5.1)
Sodium: 140 mEq/L (ref 135–145)
Total Bilirubin: 0.4 mg/dL (ref 0.2–1.2)
Total Protein: 6.9 g/dL (ref 6.0–8.3)

## 2020-11-24 LAB — VITAMIN D 25 HYDROXY (VIT D DEFICIENCY, FRACTURES): VITD: 29.16 ng/mL — ABNORMAL LOW (ref 30.00–100.00)

## 2020-11-24 LAB — LIPID PANEL
Cholesterol: 260 mg/dL — ABNORMAL HIGH (ref 0–200)
HDL: 59.8 mg/dL (ref >39.00)
LDL Cholesterol: 165 mg/dL — ABNORMAL HIGH (ref 0–99)
NonHDL: 199.83
Total CHOL/HDL Ratio: 4
Triglycerides: 175 mg/dL — ABNORMAL HIGH (ref 0.0–149.0)
VLDL: 35 mg/dL (ref 0.0–40.0)

## 2020-11-24 LAB — TSH: TSH: 1.47 u[IU]/mL (ref 0.35–5.50)

## 2020-11-24 LAB — HEMOGLOBIN A1C: Hgb A1c MFr Bld: 6.2 % (ref 4.6–6.5)

## 2020-11-24 NOTE — Assessment & Plan Note (Signed)
Single episode of postmenopausal bleeding.  She has a benign exam.  She will be referred to GYN.  She will contact us immediately if she has recurrence of bleeding.

## 2020-11-24 NOTE — Progress Notes (Signed)
Tommi Rumps, MD Phone: [phone]  Erica Woods is a 55 y.o. female who presents today for CPE.  Diet: normal, cutting sweets and sodas, some fried foods, does eat fruits and vegetables Exercise: walks 30 minutes 3-4x/week Pap smear: due Colonoscopy: UTD 10/23/14, patient notes she was advised 10 year recall Mammogram: UTD 08/28/20, patient does have a history of breast cancer.  Her oncologist has released her for yearly follow-up with [place].  She reports chronic issues with breast tenderness since having had her prior surgery for breast cancer. 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 11/22/2014   Migraines 11/25/2014   GERD (gastroesophageal reflux disease) 11/25/2014   Encounter for general adult medical examination with abnormal findings 05/02/2015   History of breast cancer 11/19/2015   Arthralgia of both hands 05/04/2016   Hypothyroidism 11/08/2016   Muscle cramps 11/08/2016   Plantar fasciitis, left 07/05/2016   Psoriasis (a type of skin inflammation) 07/05/2016   Hot flashes 07/29/2017   Hyperlipidemia 07/29/2017   Stress 11/14/2017   Prediabetes 09/20/2018   Pain of right hand 09/20/2018   Rash 03/26/2020   Decreased energy 03/26/2020   Postmenopausal bleeding 11/24/2020   Resolved Ambulatory Problems    Diagnosis Date Noted   Neoplasm of breast, 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/2014   Sinusitis, acute frontal 01/28/2016   Dysuria 10/08/2016   Past Medical History:   Diagnosis Date   Allergic rhinitis    Breast cancer ([place])    Chickenpox    Headache    Hemorrhoids    History of blood transfusion    Hypercholesteremia    Hyperthyroidism     Family History  Problem Relation Age of Onset   Diabetes Mother    Heart attack Father    Hypertension Other    Diabetes Other    Diabetes Sister    Cancer Sister    Tuberculosis Brother    Irregular heart beat Brother    Parkinson's disease Sister    Healthy Son    Irregular heart beat Son    Healthy Son     Social History   Socioeconomic History   Marital status: Married    Spouse name: Not on file   Number of children: Not on file   Years of education: Not on file   Highest education level: Not on file  Occupational History   Not on file  Tobacco Use   Smoking status: Never   Smokeless tobacco: Never  Vaping Use   Vaping Use: Never used  Substance and Sexual Activity   Alcohol use: Not Currently   Drug use: No   Sexual activity: Not on file  Other Topics Concern   Not on file  Social History Narrative   Not on file   Social Determinants of Health   Financial Resource Strain: Not on file  Food Insecurity: Not on file  Transportation Needs: Not on file  Physical Activity: Not on file  Stress: Not on file  Social Connections: Not on file  Intimate Partner Violence: Not on file    ROS  General:  Negative for nexplained weight loss, fever Skin: Negative for new or changing mole, sore that won't heal HEENT: Negative for trouble hearing, trouble seeing, ringing in ears, mouth sores, hoarseness, change in voice, dysphagia. CV:  Negative for chest pain, dyspnea, edema, palpitations Resp: Negative for cough, dyspnea, hemoptysis GI: Negative for nausea, vomiting, diarrhea, constipation, abdominal pain, melena, hematochezia. GU: Negative for dysuria, incontinence, urinary hesitance, hematuria, vaginal or penile discharge, polyuria, sexual difficulty, lumps in testicle or  breasts MSK: Negative for muscle cramps or aches, joint pain or swelling Neuro: Negative for headaches, weakness, numbness, dizziness, passing out/fainting Psych: Negative for depression, anxiety, memory problems  Objective  Physical Exam Vitals:   11/24/20 0813  BP: 130/70  Pulse: 62  Temp: 98.1 F (36.7 C)  SpO2: 99%    BP Readings from Last 3 Encounters:  11/24/20 130/70  09/23/20 115/65  07/29/20 107/62   Wt Readings from Last 3 Encounters:  11/24/20 156 lb 3.2 oz (70.9 kg)  09/23/20 157 lb (71.2 kg)  07/29/20 154 lb (69.9 kg)    Physical Exam Constitutional:      General: She is not in acute distress.    Appearance: She is not diaphoretic.  HENT:     Head: Normocephalic and atraumatic.  Cardiovascular:     Rate and Rhythm: Normal rate and regular rhythm.     Heart sounds: Normal heart sounds.  Pulmonary:     Effort: Pulmonary effort is normal.     Breath sounds: Normal breath sounds.  Chest:     Comments: Fulton Mole, CMA served as chaperone, left breast with postsurgical changes in the 3:00 to 4:00 location, slight tenderness scattered throughout both breasts, no masses palpated, no skin changes otherwise, no nipple inversion Abdominal:     General: Bowel sounds are normal. There is no distension.     Palpations: Abdomen is soft.     Tenderness: There is no abdominal tenderness. There is no guarding or rebound.  Genitourinary:    Comments: Fulton Mole, CMA served as chaperone, normal labia, normal vaginal mucosa, normal-appearing cervix, cervix is midline on bimanual exam, no adnexal masses Musculoskeletal:     Right lower leg: No edema.     Left lower leg: No edema.  Lymphadenopathy:     Cervical: No cervical adenopathy.  Skin:    General: Skin is warm and dry.  Neurological:     Mental Status: She is alert.  Psychiatric:        Mood and Affect: Mood normal.     Assessment/Plan:   Problem List Items Addressed This Visit     Encounter for  general adult medical examination with abnormal findings - Primary    Physical exam completed.  I encouraged continued exercise and healthy diet.  Discussed limiting sweets and fried foods.  Pap smear was completed today.  She is going to be referred to gynecology given her postmenopausal bleeding.  Flu vaccine and Prevnar 20 will be given today.  Lab work as outlined.      History of breast cancer    Mammogram up-to-date.  Breast exam completed today.  The patient will monitor for any breast changes and let us know if they occur.      Hyperlipidemia   Relevant Orders   Comp Met (CMET)   Lipid panel   Hypothyroidism   Relevant Orders   TSH   Postmenopausal bleeding  Single episode of postmenopausal bleeding.  She has a benign exam.  She will be referred to GYN.  She will contact us immediately if she has recurrence of bleeding.      Relevant Orders   Ambulatory referral to Gynecology   Prediabetes   Relevant Orders   HgB A1c   Other Visit Diagnoses     Cervical cancer screening       Relevant Orders   Cytology - PAP( [place])   Vitamin D deficiency       Relevant Orders   Vitamin D (25 hydroxy)       Return in about 6 months (around 05/25/2021) for Hypothyroidism.  This visit occurred during the SARS-CoV-2 public health emergency.  Safety protocols were in place, including screening questions prior to the visit, additional usage of staff PPE, and extensive cleaning of exam room while observing appropriate contact time as indicated for disinfecting solutions.    Tommi Rumps, MD [place]

## 2020-11-24 NOTE — Assessment & Plan Note (Signed)
Physical exam completed.  I encouraged continued exercise and healthy diet.  Discussed limiting sweets and fried foods.  Pap smear was completed today.  She is going to be referred to gynecology given her postmenopausal bleeding.  Flu vaccine and Prevnar 20 will be given today.  Lab work as outlined.

## 2020-11-24 NOTE — Patient Instructions (Signed)
Nice to see you. Please work on healthy diet and exercise. We will get lab work today and contact you with the results. GYN should contact you to schedule an evaluation for your postmenopausal bleeding.  If you have recurrent bleeding please let us know.

## 2020-11-24 NOTE — Assessment & Plan Note (Addendum)
Mammogram up-to-date.  Breast exam completed today.  The patient will monitor for any breast changes and let us know if they occur.

## 2020-11-24 NOTE — Addendum Note (Signed)
Addended by: Fulton Mole D on: 11/24/2020 09:09 AM   Modules accepted: Orders

## 2020-11-25 LAB — CYTOLOGY - PAP
Comment: NEGATIVE
Diagnosis: NEGATIVE
High risk HPV: NEGATIVE

## 2020-11-27 ENCOUNTER — Encounter: Payer: Self-pay | Admitting: Obstetrics & Gynecology

## 2020-11-27 ENCOUNTER — Other Ambulatory Visit (HOSPITAL_COMMUNITY)
Admission: RE | Admit: 2020-11-27 | Discharge: 2020-11-27 | Disposition: A | Discharge status: Home / Self Care | Payer: Commercial Managed Care - PPO | Source: Ambulatory Visit | Attending: Obstetrics & Gynecology | Admitting: Obstetrics & Gynecology

## 2020-11-27 ENCOUNTER — Other Ambulatory Visit: Payer: Self-pay

## 2020-11-27 ENCOUNTER — Ambulatory Visit: Payer: Commercial Managed Care - PPO | Admitting: Obstetrics & Gynecology

## 2020-11-27 ENCOUNTER — Telehealth: Payer: Self-pay | Admitting: Family Medicine

## 2020-11-27 VITALS — BP 116/71 | HR 70 | Ht 64.0 in | Wt 154.0 lb

## 2020-11-27 DIAGNOSIS — N950 Postmenopausal bleeding: Secondary | ICD-10-CM

## 2020-11-27 NOTE — Telephone Encounter (Signed)
Patient is returning your call from earlier to go over her labs,please call her at [phone].

## 2020-11-27 NOTE — Progress Notes (Signed)
NGYN patient presents for problem visit today.  Last pap: 11/24/20 WNL  Mammogram: 08/28/20 WNL pt has Hx of Breast Cancer.  Family Hx of Breast Cancer: None   CC: pt states she has had PMB and this is the first episode in the last 6 yrs. Pt states blood is brownish red onset 11/19/20 lasted x 4 days and bleeding was like spotting. And had cramping  Pt denies any pain at this time.   *Pt consents to student in exam room.

## 2020-11-27 NOTE — Progress Notes (Signed)
GYNECOLOGY OFFICE VISIT NOTE  History:   Erica Woods is a 55 y.o. G2P0 here today for evaluation of postmenopausal bleeding.  She had one episode of bleeding lasting 4 days about a month ago, prior to that had not bled in 6 years.  She denies any current abnormal vaginal discharge, bleeding, pelvic pain or other concerns.    Past Medical History:  Diagnosis Date   Allergic rhinitis    Breast cancer ([place])    Chickenpox    GERD (gastroesophageal reflux disease)    Headache    Hemorrhoids    History of blood transfusion    Hypercholesteremia    Hyperthyroidism     Past Surgical History:  Procedure Laterality Date   ABDOMINAL SURGERY     APPENDECTOMY     BALLOON DILATION N/A 02/04/2017   Procedure: BALLOON DILATION;  Surgeon: [name], MD;  Location: [facility] ENDOSCOPY;  Service: Gastroenterology;  Laterality: N/A;   BREAST BIOPSY Left 10/02/2014   Procedure: LEFT BREAST BIOPSY AFTER NEEDLE LOCALIZATION X TWO;  Surgeon: [name], MD;  Location: [facility] ORS;  Service: General;  Laterality: Left;   BREAST LUMPECTOMY Left 2016   benign   COLONOSCOPY N/A 10/23/2014   Procedure: COLONOSCOPY;  Surgeon: Rogene Houston, MD;  Location: [facility] ENDO SUITE;  Service: Endoscopy;  Laterality: N/A;  730   ESOPHAGOGASTRODUODENOSCOPY (EGD) WITH PROPOFOL N/A 02/04/2017   Procedure: ESOPHAGOGASTRODUODENOSCOPY (EGD) WITH PROPOFOL;  Surgeon: [name], MD;  Location: [facility] ENDOSCOPY;  Service: Gastroenterology;  Laterality: N/A;   LAPAROSCOPIC APPENDECTOMY  03/02/2012   Procedure: APPENDECTOMY LAPAROSCOPIC;  Surgeon: [name], MD;  Location: [facility] ORS;  Service: General;  Laterality: N/A;   OVARIAN CYST REMOVAL Left    TUBAL LIGATION     WISDOM TOOTH EXTRACTION Bilateral     The following portions of the patient's history were reviewed and updated as appropriate: allergies, current medications, past family history, past medical history, past social history, past surgical history and problem list.    Health Maintenance:  Normal pap and negative HRHPV on 11/24/2020.  Normal mammogram on 08/28/2020.   Review of Systems:  Pertinent items noted in HPI and remainder of comprehensive ROS otherwise negative.  Physical Exam:  BP 116/71   Pulse 70   Ht _0  (1.626 m)   Wt 154 lb (69.9 kg)   LMP 10/09/2014   BMI 26.43 kg/m  CONSTITUTIONAL: Well-developed, well-nourished female in no acute distress.  HEENT:  Normocephalic, atraumatic. External right and left ear normal. No scleral icterus.  NECK: Normal range of motion, supple, no masses noted on observation SKIN: No rash noted. Not diaphoretic. No erythema. No pallor. MUSCULOSKELETAL: Normal range of motion. No edema noted. NEUROLOGIC: Alert and oriented to person, place, and time. Normal muscle tone coordination. No cranial nerve deficit noted. PSYCHIATRIC: Normal mood and affect. Normal behavior. Normal judgment and thought content. CARDIOVASCULAR: Normal heart rate noted RESPIRATORY: Effort and breath sounds normal, no problems with respiration noted ABDOMEN: No masses noted. No other overt distention noted.   PELVIC: Normal appearing external genitalia; normal urethral meatus; normal appearing vaginal mucosa and cervix.  No abnormal discharge noted.  Normal uterine size, no other palpable masses, no uterine or adnexal tenderness. Performed in the presence of a chaperone  ENDOMETRIAL BIOPSY     The indications for endometrial biopsy were reviewed.   Risks of the biopsy including cramping, bleeding, infection, uterine perforation, inadequate specimen and need for additional procedures  were discussed. The patient states she  understands and agrees to undergo procedure today. Consent was signed. Time out was performed. Urine HCG was negative. During the pelvic exam, the cervix was prepped with Betadine. A single-toothed tenaculum was placed on the anterior lip of the cervix to stabilize it. The 3 mm pipelle was introduced into the  endometrial cavity without difficulty to a depth of 9 cm, and a small amount of tissue was obtained after two passes and sent to pathology. The instruments were removed from the patient's vagina. Minimal bleeding from the cervix was noted. The patient tolerated the procedure well. Routine post-procedure instructions were given to the patient.     Labs and Imaging Results for orders placed or performed in visit on 11/24/20 (from the past 168 hour(s))  Cytology - PAP( [place])   Collection Time: 11/24/20  8:30 AM  Result Value Ref Range   High risk HPV Negative    Adequacy      Satisfactory for evaluation; transformation zone component PRESENT.   Diagnosis      - Negative for intraepithelial lesion or malignancy (NILM)   Comment Normal Reference Range HPV - Negative   Comp Met (CMET)   Collection Time: 11/24/20  8:41 AM  Result Value Ref Range   Sodium 140 135 - 145 mEq/L   Potassium 4.5 3.5 - 5.1 mEq/L   Chloride 101 96 - 112 mEq/L   CO2 30 19 - 32 mEq/L   Glucose, Bld 101 (H) 70 - 99 mg/dL   BUN 9 6 - 23 mg/dL   Creatinine, Ser 0.71 0.40 - 1.20 mg/dL   Total Bilirubin 0.4 0.2 - 1.2 mg/dL   Alkaline Phosphatase 81 39 - 117 U/L   AST 14 0 - 37 U/L   ALT 12 0 - 35 U/L   Total Protein 6.9 6.0 - 8.3 g/dL   Albumin 4.3 3.5 - 5.2 g/dL   GFR 94.91 >60.00 mL/min   Calcium 9.6 8.4 - 10.5 mg/dL  Lipid panel   Collection Time: 11/24/20  8:41 AM  Result Value Ref Range   Cholesterol 260 (H) 0 - 200 mg/dL   Triglycerides 175.0 (H) 0.0 - 149.0 mg/dL   HDL 59.80 >39.00 mg/dL   VLDL 35.0 0.0 - 40.0 mg/dL   LDL Cholesterol 165 (H) 0 - 99 mg/dL   Total CHOL/HDL Ratio 4    NonHDL 199.83   HgB A1c   Collection Time: 11/24/20  8:41 AM  Result Value Ref Range   Hgb A1c MFr Bld 6.2 4.6 - 6.5 %  TSH   Collection Time: 11/24/20  8:41 AM  Result Value Ref Range   TSH 1.47 0.35 - 5.50 uIU/mL  Vitamin D (25 hydroxy)   Collection Time: 11/24/20  8:41 AM  Result Value Ref Range   VITD 29.16  (L) 30.00 - 100.00 ng/mL   No results found.    Assessment and Plan:     1. Postmenopausal bleeding Discussed etiologies of postmenopausal bleeding, concern about precancerous/hyperplasia or cancerous etiology (5 to 10% percent of cases). Also discussed role of unopposed estrogen exposure in leading to thickened or proliferative endometrium; and its possible correlation with endometrial hyperplasia/carcinoma.  Discussed that obesity is linked to endometrial pathology given that adipose cells produce extra estrogen (estrone) which can cause the endometrium to have a significant amount of estrogen exposure.  However, she was reassured that endometrial atrophy and endometrial polyps are the most common causes of postmenopausal bleeding.  Uterine bleeding in postmenopausal women is usually light and self-limited.  Exclusion of cancer is the main objective; therefore, treatment is usually unnecessary once cancer has been excluded.  The primary goal in the diagnostic evaluation of postmenopausal women with uterine bleeding is to exclude malignancy; this can include endometrial biopsy and pelvic ultrasound.   Further diagnostic evaluation is indicated for recurrent or persistent bleeding - US PELVIC COMPLETE WITH TRANSVAGINAL; Future - Surgical pathology( endometrial biopsy)  Routine preventative health maintenance measures emphasized. Please refer to After Visit Summary for other counseling recommendations.   Return for any gynecologic concerns.    I spent 25 minutes dedicated to the care of this patient including pre-visit review of records, face to face time with the patient discussing her conditions and treatments and post visit orders.    Verita Schneiders, MD, [place] [facility] [employer], [place]

## 2020-11-27 NOTE — Patient Instructions (Signed)

## 2020-11-28 NOTE — Telephone Encounter (Signed)
Called and spoke with the patient see results note.  Erica Woods,cma

## 2020-11-28 NOTE — Progress Notes (Signed)
I called and spoke with the patient and informed her of her lab results and she understood.  Patient is taking a OVC vitamin D tablet she is taking 2 per day.  Isamu Trammel,cma

## 2020-12-01 ENCOUNTER — Telehealth: Payer: Self-pay

## 2020-12-01 LAB — SURGICAL PATHOLOGY

## 2020-12-01 NOTE — Telephone Encounter (Signed)
TC to pt regarding results pt not ava lvm for pt to return call.

## 2020-12-05 ENCOUNTER — Other Ambulatory Visit: Payer: Self-pay | Admitting: *Deleted

## 2020-12-05 DIAGNOSIS — L4050 Arthropathic psoriasis, unspecified: Secondary | ICD-10-CM

## 2020-12-05 DIAGNOSIS — Z79899 Other long term (current) drug therapy: Secondary | ICD-10-CM

## 2020-12-06 LAB — COMPLETE METABOLIC PANEL WITH GFR
AG Ratio: 1.7 (calc) (ref 1.0–2.5)
ALT: 17 U/L (ref 6–29)
AST: 15 U/L (ref 10–35)
Albumin: 4 g/dL (ref 3.6–5.1)
Alkaline phosphatase (APISO): 71 U/L (ref 37–153)
BUN: 10 mg/dL (ref 7–25)
CO2: 27 mmol/L (ref 20–32)
Calcium: 9.5 mg/dL (ref 8.6–10.4)
Chloride: 101 mmol/L (ref 98–110)
Creat: 0.75 mg/dL (ref 0.50–1.03)
Globulin: 2.4 g/dL (calc) (ref 1.9–3.7)
Glucose, Bld: 92 mg/dL (ref 65–99)
Potassium: 4.7 mmol/L (ref 3.5–5.3)
Sodium: 138 mmol/L (ref 135–146)
Total Bilirubin: 0.5 mg/dL (ref 0.2–1.2)
Total Protein: 6.4 g/dL (ref 6.1–8.1)
eGFR: 93 mL/min/{1.73_m2} (ref >=60)

## 2020-12-06 LAB — CBC WITH DIFFERENTIAL/PLATELET
Absolute Monocytes: 534 cells/uL (ref 200–950)
Basophils Absolute: 58 cells/uL (ref 0–200)
Basophils Relative: 1 %
Eosinophils Absolute: 244 cells/uL (ref 15–500)
Eosinophils Relative: 4.2 %
HCT: 40.7 % (ref 35.0–45.0)
Hemoglobin: 13.7 g/dL (ref 11.7–15.5)
Lymphs Abs: 2175 cells/uL (ref 850–3900)
MCH: 30.3 pg (ref 27.0–33.0)
MCHC: 33.7 g/dL (ref 32.0–36.0)
MCV: 90 fL (ref 80.0–100.0)
MPV: 11.7 fL (ref 7.5–12.5)
Monocytes Relative: 9.2 %
Neutro Abs: 2790 cells/uL (ref 1500–7800)
Neutrophils Relative %: 48.1 %
Platelets: 252 10*3/uL (ref 140–400)
RBC: 4.52 10*6/uL (ref 3.80–5.10)
RDW: 12.7 % (ref 11.0–15.0)
Total Lymphocyte: 37.5 %
WBC: 5.8 10*3/uL (ref 3.8–10.8)

## 2020-12-06 NOTE — Progress Notes (Signed)
CBC and CMP are normal.

## 2020-12-09 ENCOUNTER — Ambulatory Visit
Admission: RE | Admit: 2020-12-09 | Discharge: 2020-12-09 | Disposition: A | Discharge status: Home / Self Care | Payer: Commercial Managed Care - PPO | Source: Ambulatory Visit | Attending: Obstetrics & Gynecology | Admitting: Obstetrics & Gynecology

## 2020-12-09 DIAGNOSIS — N950 Postmenopausal bleeding: Secondary | ICD-10-CM | POA: Diagnosis not present

## 2020-12-09 NOTE — Progress Notes (Signed)
Office Visit Note  Patient: Erica Woods             Date of Birth: Mar 28, 1965           MRN: [id number]             PCP: Leone Haven, MD Referring: Leone Haven, MD Visit Date: 12/23/2020 Occupation: @GUAROCC @  Subjective:  Joint pain and psoriasis.   History of Present Illness: Erica Woods is a 55 y.o. female with a history of psoriatic arthritis and psoriasis.  She states she has noticed improvement on Tremfya.  She has better mobility in her hands and not much joint swelling in her hands.  Although she continues to have lower back pain and SI joint pain.  She denies any recent problems with Achilles tendinitis or plantar fasciitis.  She continues to have some trochanteric bursitis.  She has noticed improvement in the psoriasis but she still have psoriasis patches.  Activities of Daily Living:  Patient reports morning stiffness for 10-15 minutes.   Patient Denies nocturnal pain.  Difficulty dressing/grooming: Denies Difficulty climbing stairs: Reports Difficulty getting out of chair: Reports Difficulty using hands for taps, buttons, cutlery, and/or writing: Denies  Review of Systems  Constitutional:  Negative for fatigue.  HENT:  Negative for mouth sores, mouth dryness and nose dryness.   Eyes:  Negative for pain, itching and dryness.  Respiratory:  Negative for shortness of breath and difficulty breathing.   Cardiovascular:  Negative for chest pain and palpitations.  Gastrointestinal:  Negative for blood in stool, constipation and diarrhea.  Endocrine: Negative for increased urination.  Genitourinary:  Negative for difficulty urinating.  Musculoskeletal:  Positive for morning stiffness. Negative for joint pain, joint pain, joint swelling, myalgias, muscle tenderness and myalgias.  Skin:  Positive for rash. Negative for color change.  Allergic/Immunologic: Negative for susceptible to infections.  Neurological:  Negative for dizziness, numbness, headaches,  memory loss and weakness.  Hematological:  Negative for bruising/bleeding tendency.  Psychiatric/Behavioral:  Negative for confusion.    PMFS History:  Patient Active Problem List   Diagnosis Date Noted   Postmenopausal bleeding 11/24/2020   Rash 03/26/2020   Decreased energy 03/26/2020   Prediabetes 09/20/2018   Pain of right hand 09/20/2018   Stress 11/14/2017   Hot flashes 07/29/2017   Hyperlipidemia 07/29/2017   Hypothyroidism 11/08/2016   Muscle cramps 11/08/2016   Plantar fasciitis, left 07/05/2016   Psoriasis (a type of skin inflammation) 07/05/2016   Arthralgia of both hands 05/04/2016   History of breast cancer 11/19/2015   Encounter for general adult medical examination with abnormal findings 05/02/2015   Migraines 11/25/2014   GERD (gastroesophageal reflux disease) 11/25/2014   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 11/22/2014    Past Medical History:  Diagnosis Date   Allergic rhinitis    Breast cancer (HCC)    Chickenpox    GERD (gastroesophageal reflux disease)    Headache    Hemorrhoids    History of blood transfusion    Hypercholesteremia    Hyperthyroidism     Family History  Problem Relation Age of Onset   Diabetes Mother    Heart attack Father    Hypertension Other    Diabetes Other    Diabetes Sister    Cancer Sister    Tuberculosis Brother    Irregular heart beat Brother    Parkinson's disease Sister    Healthy Son    Irregular heart beat Son  Healthy Son    Past Surgical History:  Procedure Laterality Date   ABDOMINAL SURGERY     APPENDECTOMY     BALLOON DILATION N/A 02/04/2017   Procedure: Larrie Kass DILATION;  Surgeon: [name], MD;  Location: [facility] ENDOSCOPY;  Service: Gastroenterology;  Laterality: N/A;   BREAST BIOPSY Left 10/02/2014   Procedure: LEFT BREAST BIOPSY AFTER NEEDLE LOCALIZATION X TWO;  Surgeon: [name], MD;  Location: [facility] ORS;  Service: General;  Laterality: Left;   BREAST LUMPECTOMY Left 2016    benign   COLONOSCOPY N/A 10/23/2014   Procedure: COLONOSCOPY;  Surgeon: Rogene Houston, MD;  Location: [facility] ENDO SUITE;  Service: Endoscopy;  Laterality: N/A;  730   ESOPHAGOGASTRODUODENOSCOPY (EGD) WITH PROPOFOL N/A 02/04/2017   Procedure: ESOPHAGOGASTRODUODENOSCOPY (EGD) WITH PROPOFOL;  Surgeon: [name], MD;  Location: [facility] ENDOSCOPY;  Service: Gastroenterology;  Laterality: N/A;   LAPAROSCOPIC APPENDECTOMY  03/02/2012   Procedure: APPENDECTOMY LAPAROSCOPIC;  Surgeon: [name], MD;  Location: [facility] ORS;  Service: General;  Laterality: N/A;   OVARIAN CYST REMOVAL Left    TUBAL LIGATION     WISDOM TOOTH EXTRACTION Bilateral    Social History   Social History Narrative   Not on file   Immunization History  Administered Date(s) Administered   Influenza,inj,Quad PF,6+ Mos 11/14/2017, 11/24/2020   Influenza-Unspecified 11/17/2015, 12/12/2018, 12/03/2019   Moderna Sars-Covid-2 Vaccination 06/05/2019, 07/03/2019, 01/18/2020, 07/04/2020   PNEUMOCOCCAL CONJUGATE-20 11/24/2020   Tdap 05/02/2015   Zoster Recombinat (Shingrix) 05/09/2017, 07/12/2017     Objective: Vital Signs: BP 129/81 (BP Location: Left Arm, Patient Position: Sitting, Cuff Size: Normal)   Pulse 65   Ht 5\' 4"  (1.626 m)   Wt 155 lb 12.8 oz (70.7 kg)   LMP 10/09/2014   BMI 26.74 kg/m    Physical Exam Vitals and nursing note reviewed.  Constitutional:      Appearance: She is well-developed.  HENT:     Head: Normocephalic and atraumatic.  Eyes:     Conjunctiva/sclera: Conjunctivae normal.  Cardiovascular:     Rate and Rhythm: Normal rate and regular rhythm.     Heart sounds: Normal heart sounds.  Pulmonary:     Effort: Pulmonary effort is normal.     Breath sounds: Normal breath sounds.  Abdominal:     General: Bowel sounds are normal.     Palpations: Abdomen is soft.  Musculoskeletal:     Cervical back: Normal range of motion.  Lymphadenopathy:     Cervical: No cervical adenopathy.  Skin:     General: Skin is warm and dry.     Capillary Refill: Capillary refill takes less than 2 seconds.     Comments: Psoriasis patches were noted on her extremities.  Neurological:     Mental Status: She is alert and oriented to person, place, and time.  Psychiatric:        Behavior: Behavior normal.     Musculoskeletal Exam: C-spine was in good range of motion.  She had discomfort with range of motion of her lumbar spine.  She had tenderness over bilateral SI joints more prominent on the right SI joint.  Shoulder joints, elbow joints, wrist joints, MCPs PIPs and DIPs with good range of motion with no synovitis.  Hip joints, knee joints, ankles, MTPs and PIPs in good range of motion with no synovitis.  There is no evidence of Achilles tendinitis or plantar fasciitis.  CDAI Exam: CDAI Score: -- Patient Global: --; Provider Global: -- Swollen: --; Tender: -- Joint Exam  12/23/2020   No joint exam has been documented for this visit   There is currently no information documented on the homunculus. Go to the Rheumatology activity and complete the homunculus joint exam.  Investigation: No additional findings.  Imaging: US PELVIC COMPLETE WITH TRANSVAGINAL  Result Date: 12/09/2020 CLINICAL DATA:  Post menopausal bleeding EXAM: TRANSABDOMINAL AND TRANSVAGINAL ULTRASOUND OF PELVIS TECHNIQUE: Both transabdominal and transvaginal ultrasound examinations of the pelvis were performed. Transabdominal technique was performed for global imaging of the pelvis including uterus, ovaries, adnexal regions, and pelvic cul-de-sac. It was necessary to proceed with endovaginal exam following the transabdominal exam to visualize the uterus endometrium ovaries. COMPARISON:  CT 03/02/2012 FINDINGS: Uterus Measurements: 9.3 x 5 x 5.5 cm = volume: 134 mL. Vague submucosal mass within the posterior uterine corpus measuring 2.7 by 2.4 x 2.8 cm. Difficult to distinguish from adjacent endometrial stripe. Endometrium Thickness: 4  mm.  No focal abnormality visualized. Right ovary Measurements: 1.2 x 1.2 x 1 cm = volume: 0.8 mL. Normal appearance/no adnexal mass. Left ovary Measurements: 1.4 x 1.1 x 1.3 cm = volume: 1.0 mL. Normal appearance/no adnexal mass. Other findings No abnormal free fluid. IMPRESSION: 1. Endometrial thickness of 4 mm. In the setting of post-menopausal bleeding, this is consistent with a benign etiology such as endometrial atrophy. If bleeding remains unresponsive to hormonal or medical therapy, sonohysterogram should be considered for focal lesion work-up. (Ref: Radiological Reasoning: Algorithmic Workup of Abnormal Vaginal Bleeding with Endovaginal Sonography and Sonohysterography. AJR 2008; [id number]) 2. Ill-defined sub mucosal mass within the posterior uterus measuring 2.8 cm, either representing adenomyoma versus sub mucosal fibroid. Electronically Signed   By: Donavan Foil M.D.   On: 12/09/2020 15:11    Recent Labs: Lab Results  Component Value Date   WBC 5.8 12/05/2020   HGB 13.7 12/05/2020   PLT 252 12/05/2020   NA 138 12/05/2020   K 4.7 12/05/2020   CL 101 12/05/2020   CO2 27 12/05/2020   GLUCOSE 92 12/05/2020   BUN 10 12/05/2020   CREATININE 0.75 12/05/2020   BILITOT 0.5 12/05/2020   ALKPHOS 81 11/24/2020   AST 15 12/05/2020   ALT 17 12/05/2020   PROT 6.4 12/05/2020   ALBUMIN 4.3 11/24/2020   CALCIUM 9.5 12/05/2020   GFRAA 115 06/16/2020   QFTBGOLDPLUS NEGATIVE 03/19/2020    Speciality Comments: MTX tabs 04/14/20-07/26/20 (stopped d/t nausea from folic acid); switched to Tremfya 08/04/20  Procedures:  No procedures performed Allergies: Gold-containing drug products   Assessment / Plan:     Visit Diagnoses: Psoriatic arthritis (HCC)-she has noticed improvement in her arthritis since she has been on Tremfya.  Although her symptoms have not completely resolved.  She has noticed improvement in the swelling in her hands and feet.  She continues to have a lot of discomfort in her SI  joint and her lower back.  She also has bilateral trochanteric bursitis.  I discussed the option of adding methotrexate to [place] which her pain is mostly in the axial skeleton.  I also discussed switching from Tremfya to [place].  Indications side effects contraindications were discussed at length.  Patient is inclined towards switching to Taltz.  Handout was given and consent was taken.  We will apply for Taltz.  She will discontinue Tremfya.  Medication counseling:  Baseline Immunosuppressant Therapy Labs TB GOLD Quantiferon TB Gold Latest Ref Rng & Units 03/19/2020  Quantiferon TB Gold Plus NEGATIVE NEGATIVE   Hepatitis Panel Hepatitis Latest Ref Rng & Units 03/19/2020  Hep  B Surface Ag NON-REACTI NON-REACTIVE  Hep B IgM NON-REACTI NON-REACTIVE  Hep C Ab NON-REACTI -  Hep C Ab NON-REACTI -   HIV Lab Results  Component Value Date   HIV NON-REACTIVE 03/19/2020   HIV NONREACTIVE 05/02/2015   Immunoglobulins Immunoglobulin Electrophoresis Latest Ref Rng & Units 03/19/2020  IgA  47 - 310 mg/dL 40(L)  IgG 600 - 1,640 mg/dL 955  IgM 50 - 300 mg/dL 197   SPEP Serum Protein Electrophoresis Latest Ref Rng & Units 12/05/2020  Total Protein 6.1 - 8.1 g/dL 6.4  Albumin 3.8 - 4.8 g/dL -  Alpha-1 0.2 - 0.3 g/dL -  Alpha-2 0.5 - 0.9 g/dL -  Beta Globulin 0.4 - 0.6 g/dL -  Beta 2 0.2 - 0.5 g/dL -  Gamma Globulin 0.8 - 1.7 g/dL -    Chest Xray: March 19, 2020  Does patient have a history of inflammatory bowel disease? No  Counseled patient that Donnetta Hail is a IL-17 inhibitor that works to reduce pain and inflammation associated with arthritis.  Counseled patient on purpose, proper use, and adverse effects of Taltz. Reviewed the most common adverse effects of infection, inflammatory bowel disease, and allergic reaction. Counseled patient that Donnetta Hail should be held for infection and prior to scheduled surgery.  Counseled patient to avoid live vaccines while on Taltz.  Advised patient to get annual  influenza vaccine, pneumococcal vaccine, and Shingrix as indicated.  Reviewed storage information for Taltz.  Reviewed the importance of regular labs while on [place]. Standing orders placed and is to return in 1 month and then every 3 months after initiation.  Provided patient with medication education material and answered all questions.  Patient consented to [place].  Will upload consent into patient's chart.  Will apply for Taltz through patient's insurance and update when we receive a response.  Advised initial injection must be administered in office.  Patient voiced understanding.    Taltz dose will be: For psoriatic arthritis and plaque psoriasis overlap load of 160 mg then 80 mg on weeks 2,4,6,8,10,12 then 80 mg every 28 days  Prescription will be sent to pharmacy pending lab results and insurance approval.   Psoriasis-he still have active psoriasis patches on her extremities.  She has been using topical agents.  High risk medication use - Tremfya (started on 08/04/2020).  She was advised to discontinue methotrexate since she is not willing to retry taking folic acid as prescribed.  Labs from December 05, 2020 were reviewed which were within normal limits.  TB gold was negative on March 19, 2020.  Information regarding immunization was placed in the AVS.  She was advised to stop Tremfya in case she develops an infection and resume after the infection resolves.- Plan: QuantiFERON-TB Gold Plus with next labs.  Primary osteoarthritis of both hands-she had no synovitis on examination today.  Chronic right SI joint pain - X-ray was unremarkable.  She continues to have a lot of discomfort in the right SI joint.  Plantar fasciitis, left - History of recurrent plantar fasciitis for the last 3 years.  She has undergone cortisone injections in the past.  She had no [place] fasciitis today.  Trochanteric bursitis of both hips-she continues to have some discomfort over bilateral trochanteric  region.  Primary osteoarthritis of both feet - Osteoarthritis was noted on the x-rays.  History of right ankle joint fracture in the past.  No synovitis was noted.  Arthropathy of lumbar facet joint - History of lower back painx 10 years.  History of right-sided sciatica.  X-ray showed mild facet joint arthropathy.  She continues to have lower back pain.  Other medical problems are listed as follows:  Vitamin D deficiency  History of hyperlipidemia  History of hypothyroidism  Hx of migraines  Prediabetes  History of gastroesophageal reflux (GERD)  Lobular carcinoma in situ (LCIS) of left breast  Orders: Orders Placed This Encounter  Procedures   QuantiFERON-TB Gold Plus    Meds ordered this encounter  Medications   triamcinolone cream (KENALOG) 0.1 %    Sig: Apply 1 application topically 2 (two) times daily.    Dispense:  30 g    Refill:  0     Follow-Up Instructions: Return in about 3 months (around 03/25/2021) for Psoriatic arthritis.   Bo Merino, MD  Note - This record has been created using Editor, commissioning.  Chart creation errors have been sought, but may not always  have been located. Such creation errors do not reflect on  the standard of medical care.

## 2020-12-23 ENCOUNTER — Telehealth: Payer: Self-pay | Admitting: Pharmacist

## 2020-12-23 ENCOUNTER — Other Ambulatory Visit: Payer: Self-pay

## 2020-12-23 ENCOUNTER — Encounter: Payer: Self-pay | Admitting: Rheumatology

## 2020-12-23 ENCOUNTER — Ambulatory Visit: Payer: Commercial Managed Care - PPO | Admitting: Rheumatology

## 2020-12-23 VITALS — BP 129/81 | HR 65 | Ht 64.0 in | Wt 155.8 lb

## 2020-12-23 DIAGNOSIS — Z79899 Other long term (current) drug therapy: Secondary | ICD-10-CM

## 2020-12-23 DIAGNOSIS — M533 Sacrococcygeal disorders, not elsewhere classified: Secondary | ICD-10-CM

## 2020-12-23 DIAGNOSIS — M47816 Spondylosis without myelopathy or radiculopathy, lumbar region: Secondary | ICD-10-CM

## 2020-12-23 DIAGNOSIS — L409 Psoriasis, unspecified: Secondary | ICD-10-CM | POA: Diagnosis not present

## 2020-12-23 DIAGNOSIS — R7303 Prediabetes: Secondary | ICD-10-CM

## 2020-12-23 DIAGNOSIS — M19071 Primary osteoarthritis, right ankle and foot: Secondary | ICD-10-CM

## 2020-12-23 DIAGNOSIS — M19072 Primary osteoarthritis, left ankle and foot: Secondary | ICD-10-CM

## 2020-12-23 DIAGNOSIS — L4050 Arthropathic psoriasis, unspecified: Secondary | ICD-10-CM | POA: Diagnosis not present

## 2020-12-23 DIAGNOSIS — E559 Vitamin D deficiency, unspecified: Secondary | ICD-10-CM

## 2020-12-23 DIAGNOSIS — M722 Plantar fascial fibromatosis: Secondary | ICD-10-CM

## 2020-12-23 DIAGNOSIS — G8929 Other chronic pain: Secondary | ICD-10-CM

## 2020-12-23 DIAGNOSIS — M19041 Primary osteoarthritis, right hand: Secondary | ICD-10-CM

## 2020-12-23 DIAGNOSIS — M19042 Primary osteoarthritis, left hand: Secondary | ICD-10-CM

## 2020-12-23 DIAGNOSIS — D0502 Lobular carcinoma in situ of left breast: Secondary | ICD-10-CM

## 2020-12-23 DIAGNOSIS — M7061 Trochanteric bursitis, right hip: Secondary | ICD-10-CM

## 2020-12-23 DIAGNOSIS — Z8719 Personal history of other diseases of the digestive system: Secondary | ICD-10-CM

## 2020-12-23 DIAGNOSIS — Z8639 Personal history of other endocrine, nutritional and metabolic disease: Secondary | ICD-10-CM

## 2020-12-23 DIAGNOSIS — Z8669 Personal history of other diseases of the nervous system and sense organs: Secondary | ICD-10-CM

## 2020-12-23 DIAGNOSIS — M7062 Trochanteric bursitis, left hip: Secondary | ICD-10-CM

## 2020-12-23 MED ORDER — TRIAMCINOLONE ACETONIDE 0.1 % EX CREA: 1.0000 "application " | TOPICAL_CREAM | Freq: Two times a day (BID) | CUTANEOUS | 0 refills | Status: DC

## 2020-12-23 NOTE — Patient Instructions (Addendum)
Standing Labs We placed an order today for your standing lab work.   Please have your standing labs drawn in January and every 3 months  If possible, please have your labs drawn 2 weeks prior to your appointment so that the provider can discuss your results at your appointment.  Please note that you may see your imaging and lab results in [place] before we have reviewed them. We may be awaiting multiple results to interpret others before contacting you. Please allow our office up to 72 hours to thoroughly review all of the results before contacting the office for clarification of your results.  We have open lab daily: Monday through Thursday from 1:30-4:30 PM and Friday from 1:30-4:00 PM at the office of Dr. Bo Merino, [facility].   Please be advised, all patients with office appointments requiring lab work will take precedent over walk-in lab work.  If possible, please come for your lab work on Monday and Friday afternoons, as you may experience shorter wait times. The office is located at [address] [place], [place], [place] [address] No appointment is necessary.   Labs are drawn by [facility]. Please bring your co-pay at the time of your lab draw.  You may receive a bill from [facility] for your lab work.  If you wish to have your labs drawn at another location, please call the office 24 hours in advance to send orders.  If you have any questions regarding directions or hours of operation,  please call [phone].   As a reminder, please drink plenty of water prior to coming for your lab work. Thanks!   Vaccines You are taking a medication(s) that can suppress your immune system.  The following immunizations are recommended: Flu annually Covid-19  Td/Tdap (tetanus, diphtheria, pertussis) every 10 years Pneumonia (Prevnar 15 then Pneumovax 23 at least 1 year apart.  Alternatively, can take Prevnar 20 without needing additional dose) Shingrix: 2 doses from 4 weeks  to 6 months apart  Please check with your PCP to make sure you are up to date.   If you have signs or symptoms of an infection or start antibiotics: First, call your PCP for workup of your infection. Hold your medication through the infection, until you complete your antibiotics, and until symptoms resolve if you take the following: Injectable medication (Actemra, Benlysta, Cimzia, Cosentyx, Enbrel, Humira, Kevzara, Orencia, Remicade, Simponi, Stelara, Taltz, Tremfya) Methotrexate Leflunomide (Arava) Mycophenolate (Cellcept) Morrie Sheldon, Olumiant, or Rinvoq   Ixekizumab injection What is this medication? IXEKIZUMAB (ix e KIZ ue mab) is used to treat plaque psoriasis, psoriatic arthritis, ankylosing spondylitis, and active non-radiographic axial spondyloarthritis. This medicine may be used for other purposes; ask your health care provider or pharmacist if you have questions. COMMON BRAND NAME(S): TALTZ What should I tell my care team before I take this medication? They need to know if you have any of these conditions: immune system problems infection (especially a viral infection such as chickenpox, cold sores, or herpes) recently received or are scheduled to receive a vaccine tuberculosis, a positive skin test for tuberculosis, or have recently been in close contact with someone who has tuberculosis an unusual or allergic reaction to ixekizumab, other medicines, foods, dyes or preservatives pregnant or trying to get pregnant breast-feeding How should I use this medication? This medicine is for injection under the skin. It may be administered by a healthcare professional in a hospital or clinic setting or at home. If you get this medicine at home, you will  be taught how to prepare and give this medicine. Use exactly as directed. Take your medicine at regular intervals. Do not take your medicine more often than directed. It is important that you put your used needles and syringes in a special  sharps container. Do not put them in a trash can. If you do not have a sharps container, call your pharmacist or healthcare provider to get one. A special MedGuide will be given to you by the pharmacist with each prescription and refill. Be sure to read this information carefully each time. Talk to your pediatrician regarding the use of this medicine in children. While this drug may be prescribed for children as young as 6 years for selected conditions, precautions do apply. Overdosage: If you think you have taken too much of this medicine contact a poison control center or emergency room at once. NOTE: This medicine is only for you. Do not share this medicine with others. What if I miss a dose? It is important not to miss your dose. Call your doctor of health care professional if you are unable to keep an appointment. If you give yourself the medicine and you miss a dose, take it as soon as you can. Then be sure to take your next doses on your regular schedule. Do not take double or extra doses. If you have questions about a missed injection, call your health care professional. What may interact with this medication? Do not take this medicine with any of the following medications: live virus vaccines This medicine may also interact with the following medications: inactivated vaccines This list may not describe all possible interactions. Give your health care provider a list of all the medicines, herbs, non-prescription drugs, or dietary supplements you use. Also tell them if you smoke, drink alcohol, or use illegal drugs. Some items may interact with your medicine. What should I watch for while using this medication? Tell your doctor or healthcare professional if your symptoms do not start to get better or if they get worse. You will be tested for tuberculosis (TB) before you start this medicine. If your doctor prescribes any medicine for TB, you should start taking the TB medicine before starting  this medicine. Make sure to finish the full course of TB medicine. Call your doctor or healthcare professional for advice if you get a fever, chills or sore throat, or other symptoms of a cold or flu. Do not treat yourself. This drug decreases your body's ability to fight infections. Try to avoid being around people who are sick. This medicine can decrease the response to a vaccine. If you need to get vaccinated, tell your healthcare professional if you have received this medicine within the last 6 months. Extra booster doses may be needed. Talk to your doctor to see if a different vaccination schedule is needed. What side effects may I notice from receiving this medication? Side effects that you should report to your doctor or health care professional as soon as possible: allergic reactions like skin rash, itching or hives, swelling of the face, lips, or tongue signs and symptoms of infection like fever or chills; cough; sore throat; pain or trouble passing urine signs and symptoms of bowel problems like abdominal pain, diarrhea, blood in the stool, and weight loss white patches in the mouth or throat vaginal discharge, itching, or odor in women Side effects that usually do not require medical attention (report to your doctor or health care professional if they continue or are bothersome): nausea runny  nose sinus trouble This list may not describe all possible side effects. Call your doctor for medical advice about side effects. You may report side effects to FDA at 1-800-FDA-1088. Where should I keep my medication? Keep out of the reach of children. Store the prefilled syringe or injection pen in a refrigerator between 2 to 8 degrees C (36 to 46 degrees F). Keep the syringe or the pen in the original carton until ready for use. Protect from light. Do not freeze. Do not shake. Prior to use, remove the syringe or pen from the refrigerator and use within 30 minutes. Throw away any unused medicine  after the expiration date on the label. NOTE: This sheet is a summary. It may not cover all possible information. If you have questions about this medicine, talk to your doctor, pharmacist, or health care provider.  2022 Elsevier/Gold Standard (2020-10-21 00:00:00)

## 2020-12-23 NOTE — Telephone Encounter (Addendum)
Please start [place].  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  Last Tremfya dose on 12/22/20. Will be able to start [place] on 02/16/21

## 2020-12-23 NOTE — Progress Notes (Signed)
Pharmacy Note  Subjective:  Patient presents today to [facility] for follow up office visit.  Patient was seen by the pharmacist for counseling on [place] for psoriatic arthritis and plaque psoriasis..  Prior therapy includes:Tremfya x 5 months. Last Tremfya dose was 12/22/20   History of inflammatory bowel disease: No  Objective:  CBC    Component Value Date/Time   WBC 5.8 12/05/2020 1359   RBC 4.52 12/05/2020 1359   HGB 13.7 12/05/2020 1359   HGB 12.8 11/29/2019 0843   HGB 13.4 11/22/2016 1228   HCT 40.7 12/05/2020 1359   HCT 40.3 11/22/2016 1228   PLT 252 12/05/2020 1359   PLT 239 11/29/2019 0843   PLT 269 11/22/2016 1228   MCV 90.0 12/05/2020 1359   MCV 95.0 11/22/2016 1228   MCH 30.3 12/05/2020 1359   MCHC 33.7 12/05/2020 1359   RDW 12.7 12/05/2020 1359   RDW 12.5 11/22/2016 1228   LYMPHSABS 2,175 12/05/2020 1359   LYMPHSABS 2.2 11/22/2016 1228   MONOABS 0.5 03/27/2020 0947   MONOABS 0.6 11/22/2016 1228   EOSABS 244 12/05/2020 1359   EOSABS 0.2 11/22/2016 1228   BASOSABS 58 12/05/2020 1359   BASOSABS 0.1 11/22/2016 1228    CMP     Component Value Date/Time   NA 138 12/05/2020 1359   NA 141 11/22/2016 1228   K 4.7 12/05/2020 1359   K 4.2 11/22/2016 1228   CL 101 12/05/2020 1359   CO2 27 12/05/2020 1359   CO2 28 11/22/2016 1228   GLUCOSE 92 12/05/2020 1359   GLUCOSE 97 11/22/2016 1228   BUN 10 12/05/2020 1359   BUN 9.1 11/22/2016 1228   CREATININE 0.75 12/05/2020 1359   CREATININE 0.8 11/22/2016 1228   CALCIUM 9.5 12/05/2020 1359   CALCIUM 9.4 11/22/2016 1228   PROT 6.4 12/05/2020 1359   PROT 7.1 11/22/2016 1228   ALBUMIN 4.3 11/24/2020 0841   ALBUMIN 3.5 11/22/2016 1228   AST 15 12/05/2020 1359   AST 19 11/29/2019 0843   AST 23 11/22/2016 1228   ALT 17 12/05/2020 1359   ALT 19 11/29/2019 0843   ALT 27 11/22/2016 1228   ALKPHOS 81 11/24/2020 0841   ALKPHOS 69 11/22/2016 1228   BILITOT 0.5 12/05/2020 1359   BILITOT 0.3 11/29/2019 0843    BILITOT 0.42 11/22/2016 1228   GFRNONAA 99 06/16/2020 1405   GFRAA 115 06/16/2020 1405    Baseline Immunosuppressant Therapy Labs  Quantiferon TB Gold Latest Ref Rng & Units 03/19/2020  Quantiferon TB Gold Plus NEGATIVE NEGATIVE    Hepatitis Latest Ref Rng & Units 03/19/2020  Hep B Surface Ag NON-REACTI NON-REACTIVE  Hep B IgM NON-REACTI NON-REACTIVE  Hep C Ab NON-REACTI -  Hep C Ab NON-REACTI -    Lab Results  Component Value Date   HIV NON-REACTIVE 03/19/2020   HIV NONREACTIVE 05/02/2015    Immunoglobulin Electrophoresis Latest Ref Rng & Units 03/19/2020  IgA  47 - 310 mg/dL 40(L)  IgG 600 - 1,640 mg/dL 955  IgM 50 - 300 mg/dL 197    Serum Protein Electrophoresis Latest Ref Rng & Units 12/05/2020  Total Protein 6.1 - 8.1 g/dL 6.4  Albumin 3.8 - 4.8 g/dL -  Alpha-1 0.2 - 0.3 g/dL -  Alpha-2 0.5 - 0.9 g/dL -  Beta Globulin 0.4 - 0.6 g/dL -  Beta 2 0.2 - 0.5 g/dL -  Gamma Globulin 0.8 - 1.7 g/dL -    No results found for: G6PDH  No results found  for: TPMT  Chest Xray: 03/19/20 no active cardiopulmonary disease Had colonoscopy 10/23/2014 that was wnl - Normal mucosa of cecum, ascending colon, hepatic flexure, transverse colon, splenic flexure, descending and sigmoid colon. Normal rectal mucosa. Prominent hemorrhoids below the dentate line.  Assessment/Plan:  Counseled patient that Donnetta Hail is a IL-17 inhibitor that works to reduce pain and inflammation associated with arthritis.  Counseled patient on purpose, proper use, and adverse effects of Taltz. Reviewed the most common adverse effects of infection (more commonly nasopharyngitis, URTI), inflammatory bowel disease, and allergic reaction. Counseled patient that Donnetta Hail should be held for infection and prior to scheduled surgery.  Counseled patient to avoid live vaccines while on Taltz. Recommend annual influenza, PCV 15 or PCV20 or Pneumovax 23, and Shingrix as indicated. Reviewed storage information for Taltz.  Reviewed the  importance of regular labs while on [place]. Will monitor CBC and CMP 1 month after starting and every 3 months routinely thereafter. Will monitor TB gold annually. Standing orders placed. Provided patient with medication education material and answered all questions.  Patient consented to [place].  Will upload consent into patient's chart.  Will apply for Taltz through patient's insurance and update when we receive a response.  Advised initial injection must be administered in office.  Patient voiced understanding.    Taltz dose will be: For psoriatic arthritis and plaque psoriasis overlap load of 160 mg then 80 mg on weeks 2,4,6,8,10,12 then 80 mg every 28 days  Prescription will be sent to pharmacy pending lab results and insurance approval.  Knox Saliva, PharmD, MPH, BCPS Clinical Pharmacist (Rheumatology and Pulmonology)

## 2020-12-29 NOTE — Telephone Encounter (Addendum)
Submitted a Prior Authorization request to [facility] for TALTZ via [email or web address] Will update once we receive a response.  PA #: [id number]  Can check status at : [email or web address]  Knox Saliva, PharmD, MPH, BCPS Clinical Pharmacist (Rheumatology and Pulmonology)

## 2020-12-31 ENCOUNTER — Other Ambulatory Visit (HOSPITAL_COMMUNITY): Payer: Self-pay

## 2020-12-31 NOTE — Telephone Encounter (Addendum)
Received notification from [facility] regarding a prior authorization for [place]. Authorization has been APPROVED from 12/30/20 through 12/29/21  Unable to run test claim as patient must fill through [place]: [phone]   Authorization # [id number] Phone: [phone]  Patient is unable to start [place] until 02/16/21 earliest. Will f/u in on 02/15/21 to ensure authorization remains in place and schedule new start for Taltz.  Sent myChart message to advise patient about approval and to encourage her to enroll into savings card prior to starting Taltz.  Knox Saliva, PharmD, MPH, BCPS Clinical Pharmacist (Rheumatology and Pulmonology)

## 2021-01-02 ENCOUNTER — Other Ambulatory Visit: Payer: Self-pay | Admitting: Physician Assistant

## 2021-01-02 DIAGNOSIS — L409 Psoriasis, unspecified: Secondary | ICD-10-CM

## 2021-01-02 DIAGNOSIS — L4050 Arthropathic psoriasis, unspecified: Secondary | ICD-10-CM

## 2021-01-05 ENCOUNTER — Other Ambulatory Visit: Payer: Commercial Managed Care - PPO

## 2021-01-05 ENCOUNTER — Encounter: Payer: Self-pay | Admitting: Family Medicine

## 2021-01-05 ENCOUNTER — Telehealth: Payer: Commercial Managed Care - PPO | Admitting: Family Medicine

## 2021-01-05 VITALS — Ht 64.0 in | Wt 153.0 lb

## 2021-01-05 DIAGNOSIS — J029 Acute pharyngitis, unspecified: Secondary | ICD-10-CM

## 2021-01-05 LAB — POCT INFLUENZA A/B
Influenza A, POC: NEGATIVE
Influenza B, POC: NEGATIVE

## 2021-01-05 LAB — POCT RAPID STREP A (OFFICE): Rapid Strep A Screen: NEGATIVE

## 2021-01-05 NOTE — Progress Notes (Signed)
Virtual Visit via video Note  This visit type was conducted due to national recommendations for restrictions regarding the COVID-19 pandemic (e.g. social distancing).  This format is felt to be most appropriate for this patient at this time.  All issues noted in this document were discussed and addressed.  No physical exam was performed (except for noted visual exam findings with Video Visits).   I connected with Erica Woods today at  3:30 PM EST by a video enabled telemedicine application and verified that I am speaking with the correct person using two identifiers. Location patient: home Location provider: work Persons participating in the virtual visit: patient, provider  I discussed the limitations, risks, security and privacy concerns of performing an evaluation and management service by telephone and the availability of in person appointments. I also discussed with the patient that there may be a patient responsible charge related to this service. The patient expressed understanding and agreed to proceed.  Reason for visit: Same-day visit.  HPI: Sore throat: Patient notes onset of this 2 days ago.  Has some postnasal drip when she lays down.  Some cough when she lays down.  Notes her left ear hurts.  No significant congestion.  No fevers.  No shortness of breath or taste or smell disturbances.  No COVID exposures.  She notes her husband has been sick recently and states he has the flu though notes he was not tested for this.  She has been using cough drops as well as Alka-Seltzer cold.   ROS: See pertinent positives and negatives per HPI.  Past Medical History:  Diagnosis Date   Allergic rhinitis    Breast cancer ([place])    Chickenpox    GERD (gastroesophageal reflux disease)    Headache    Hemorrhoids    History of blood transfusion    Hypercholesteremia    Hyperthyroidism     Past Surgical History:  Procedure Laterality Date   ABDOMINAL SURGERY     APPENDECTOMY      BALLOON DILATION N/A 02/04/2017   Procedure: BALLOON DILATION;  Surgeon: [name], MD;  Location[facility] ENDOSCOPY;  Service: Gastroenterology;  Laterality: N/A;   BREAST BIOPSY Left 10/02/2014   Procedure: LEFT BREAST BIOPSY AFTER NEEDLE LOCALIZATION X TWO;  Surgeon: [name], MD;  Location: [facility] ORS;  Service: General;  Laterality: Left;   BREAST LUMPECTOMY Left 2016   benign   COLONOSCOPY N/A 10/23/2014   Procedure: COLONOSCOPY;  Surgeon: Rogene Houston, MD;  Location: [facility] ENDO SUITE;  Service: Endoscopy;  Laterality: N/A;  730   ESOPHAGOGASTRODUODENOSCOPY (EGD) WITH PROPOFOL N/A 02/04/2017   Procedure: ESOPHAGOGASTRODUODENOSCOPY (EGD) WITH PROPOFOL;  Surgeon: [name], MD;  Location: [facility] ENDOSCOPY;  Service: Gastroenterology;  Laterality: N/A;   LAPAROSCOPIC APPENDECTOMY  03/02/2012   Procedure: APPENDECTOMY LAPAROSCOPIC;  Surgeon: [name], MD;  Location: [facility] ORS;  Service: General;  Laterality: N/A;   OVARIAN CYST REMOVAL Left    TUBAL LIGATION     WISDOM TOOTH EXTRACTION Bilateral     Family History  Problem Relation Age of Onset   Diabetes Mother    Heart attack Father    Hypertension Other    Diabetes Other    Diabetes Sister    Cancer Sister    Tuberculosis Brother    Irregular heart beat Brother    Parkinson's disease Sister    Healthy Son    Irregular heart beat Son    Healthy Son     SOCIAL HX: Non-smoker  Current Outpatient Medications:    aspirin-acetaminophen-caffeine (EXCEDRIN MIGRAINE) 250-250-65 MG tablet, Take 1 tablet by mouth every 6 (six) hours as needed for headache., Disp: 30 tablet, Rfl: 0   omeprazole (PRILOSEC) 40 MG capsule, Take 1 capsule (40 mg total) by mouth daily., Disp: 90 capsule, Rfl: 3   thyroid (NP THYROID) 30 MG tablet, TAKE 1 & 1/2 (ONE & ONE-HALF) TABLETS BY MOUTH ONCE DAILY BEFORE BREAKFAST, Disp: 135 tablet, Rfl: 0   triamcinolone cream (KENALOG) 0.1 %, Apply 1 application topically 2 (two) times daily., Disp: 30 g,  Rfl: 0  Current Facility-Administered Medications:    cyanocobalamin ((VITAMIN B-12)) injection 1,000 mcg, 1,000 mcg, Intramuscular, Once, Caryl Bis, Angela Adam, MD  EXAM:  VITALS per patient if applicable:  GENERAL: alert, oriented, appears well and in no acute distress  HEENT: atraumatic, conjunttiva clear, no obvious abnormalities on inspection of external nose and ears  NECK: normal movements of the head and neck  LUNGS: on inspection no signs of respiratory distress, breathing rate appears normal, no obvious gross SOB, gasping or wheezing  CV: no obvious cyanosis  MS: moves all visible extremities without noticeable abnormality  PSYCH/NEURO: pleasant and cooperative, no obvious depression or anxiety, speech and thought processing grossly intact  ASSESSMENT AND PLAN:  Discussed the following assessment and plan:  Problem List Items Addressed This Visit     Sore throat - Primary    Differential includes viral illness, COVID, flu, and strep throat.  She will come to be tested for COVID, flu, and strep throat.  Treatment will depend on results.  Advised to seek medical attention for shortness of breath and fevers of 103 F or higher.      Relevant Orders   Novel Coronavirus, NAA ([facility])   POCT Influenza A/B   POCT rapid strep A    No follow-ups on file.   I discussed the assessment and treatment plan with the patient. The patient was provided an opportunity to ask questions and all were answered. The patient agreed with the plan and demonstrated an understanding of the instructions.   The patient was advised to call back or seek an in-person evaluation if the symptoms worsen or if the condition fails to improve as anticipated.    Tommi Rumps, MD

## 2021-01-05 NOTE — Assessment & Plan Note (Signed)
Differential includes viral illness, COVID, flu, and strep throat.  She will come to be tested for COVID, flu, and strep throat.  Treatment will depend on results.  Advised to seek medical attention for shortness of breath and fevers of 103 F or higher.

## 2021-01-06 ENCOUNTER — Telehealth: Payer: Self-pay | Admitting: Family Medicine

## 2021-01-06 LAB — SARS-COV-2, NAA 2 DAY TAT

## 2021-01-06 LAB — NOVEL CORONAVIRUS, NAA: SARS-CoV-2, NAA: DETECTED — AB

## 2021-01-06 MED ORDER — MOLNUPIRAVIR EUA 200MG CAPSULE: 4.0000 | ORAL_CAPSULE | Freq: Two times a day (BID) | ORAL | 0 refills | Status: AC

## 2021-01-06 NOTE — Telephone Encounter (Addendum)
Called and spoke with the patient regarding her positive COVID result.  We discussed quarantine lasting for 5 days after the onset of her symptoms.  Discussed that she could come off of her quarantine on 01/09/2021 as long as she is feeling better.  From 01/09/2021-01/13/21 she will have to wear a mask if she leaves the house.  I discussed that she should avoid anyone that lives with her.  Discussed wearing a mask around them if she does have to be around them.  Advised that they get tested if they develop symptoms.  Advised that they get tested 5 days after exposure to her.  Discussed that she seek medical attention for any worsening symptoms or if she develops chest pain, shortness of breath, cough productive of blood, or elevated fevers.  Discussed treatment with Paxlovid or molnupiravir.  Discussed the potential for rebound on Paxil elevated.  Molnupiravir was sent to the pharmacy.  A work note will be provided to the patient.  She works in a healthcare facility and I advised that they would determine when she would be able to return to work.

## 2021-01-06 NOTE — Telephone Encounter (Signed)
Note placed up front for patient to pick up.  Arshan Jabs,cma

## 2021-01-13 ENCOUNTER — Telehealth: Payer: Self-pay | Admitting: Family Medicine

## 2021-01-13 NOTE — Telephone Encounter (Signed)
I called and spoke with the patient and she stated that the medication that given to her was Molnupiravir for covid, she finished the medication and after her last dose she broke out in a rash allover her body, it is better now and she wanted it noted in her chart. She used benadryl every 4 hours and hydrocodone creme.  Cathern Tahir,cma

## 2021-01-13 NOTE — Telephone Encounter (Signed)
Pt is having a reaction to medication she has begun taking. Pt says she is experiencing a rash. She has been taking benadryl and using hydrocortisone cream. Pt asks we call her back on her cellphone

## 2021-01-14 NOTE — Telephone Encounter (Signed)
Molnupiravir added to the patient's allergy list.  Jerriann Schrom,cma

## 2021-01-14 NOTE — Telephone Encounter (Signed)
Noted. Please add this to her allergy list.

## 2021-02-12 ENCOUNTER — Other Ambulatory Visit: Payer: Self-pay | Admitting: Family Medicine

## 2021-02-19 ENCOUNTER — Other Ambulatory Visit (HOSPITAL_COMMUNITY): Payer: Self-pay

## 2021-02-19 NOTE — Telephone Encounter (Signed)
Patient returned call to the office. Patient would like you to give her a call back.

## 2021-02-19 NOTE — Telephone Encounter (Signed)
Taltz test claim still processing. Patient must fill through [facility].  ATC patient to schedule new start appointment - unable to reach. Left VM requesting return call  Knox Saliva, PharmD, MPH, BCPS Clinical Pharmacist (Rheumatology and Pulmonology)

## 2021-02-20 NOTE — Telephone Encounter (Signed)
Returned call to patient. Unable to reach. Will attempt again next week to schedule Taltz new start  Knox Saliva, PharmD, MPH, BCPS Clinical Pharmacist (Rheumatology and Pulmonology)

## 2021-02-23 NOTE — Telephone Encounter (Signed)
Patient returned call. She states that she held her Tremfya dose that was due.  She signed up for copay card but has been unable to open email.  Advised her to bring email with her to clinic and we will go through it together.  Scheduled for Taltz new start on 02/24/21  Knox Saliva, PharmD, MPH, BCPS Clinical Pharmacist (Rheumatology and Pulmonology)

## 2021-02-24 ENCOUNTER — Other Ambulatory Visit: Payer: Self-pay

## 2021-02-24 ENCOUNTER — Ambulatory Visit (INDEPENDENT_AMBULATORY_CARE_PROVIDER_SITE_OTHER): Payer: Commercial Managed Care - PPO | Admitting: Pharmacist

## 2021-02-24 VITALS — BP 118/79 | HR 73

## 2021-02-24 DIAGNOSIS — Z79899 Other long term (current) drug therapy: Secondary | ICD-10-CM

## 2021-02-24 DIAGNOSIS — L4050 Arthropathic psoriasis, unspecified: Secondary | ICD-10-CM

## 2021-02-24 DIAGNOSIS — L409 Psoriasis, unspecified: Secondary | ICD-10-CM

## 2021-02-24 DIAGNOSIS — Z7189 Other specified counseling: Secondary | ICD-10-CM | POA: Diagnosis not present

## 2021-02-24 MED ORDER — TALTZ 80 MG/ML ~~LOC~~ SOAJ: SUBCUTANEOUS | 0 refills | Status: DC

## 2021-02-24 NOTE — Telephone Encounter (Signed)
[place] card:   Erica Woods: [id number] PCN: [id number] GRP: [id number] ID: [id number] Expiration Date: 02/15/2024

## 2021-02-24 NOTE — Patient Instructions (Addendum)
Your next TALTZ dose is due on 03/10/21, 03/24/21, 04/07/21, 04/21/21, 05/05/21, 05/19/21, 06/16/21 and every 4 weeks thereafter  HOLD TALTZ if you have signs or symptoms of an infection. You can resume once you feel better or back to your baseline. HOLD TALTZ if you start antibiotics to treat an infection. HOLD TALTZ around the time of surgery/procedures. Your surgeon will be able to provide recommendations on when to hold BEFORE and when you are cleared to [place].  Pharmacy information: Your prescription will be shipped from [facility]. Their phone number is [phone] Please call to schedule shipment and confirm address. They will mail your medication to your home.  Cost information: Your copay should be affordable. If you call the pharmacy and it is not affordable, please double-check that they are billing through your copay card as secondary coverage. That copay card information is: RXBIN: [id number] PCN: [id number] GRP: [id number] ID: [id number] Expiration Date: 02/15/2024  Labs are due in 1 month then every 3 months. Lab hours are from Monday to Thursday 1:30-4:30pm and Friday 1:30-4pm. You do not need an appointment if you come for labs during these times.  How to manage an injection site reaction: Remember the 5 C's: COUNTER - leave on the counter at least 30 minutes but up to overnight to bring medication to room temperature. This may help prevent stinging COLD - place something cold (like an ice gel pack or cold water bottle) on the injection site just before cleansing with alcohol. This may help reduce pain CLARITIN - use Claritin (generic name is loratadine) for the first two weeks of treatment or the day of, the day before, and the day after injecting. This will help to minimize injection site reactions CORTISONE CREAM - apply if injection site is irritated and itching CALL ME - if injection site reaction is bigger than the size of your fist, looks infected, blisters, or if you  develop hives

## 2021-02-24 NOTE — Progress Notes (Signed)
Pharmacy Note  Subjective:   Patient presents to clinic today to receive first dose of Taltz for psoriatic arthritis/psoriasis overlap. She is switching therapy from [facility]. Her last dose of Tremfya was 12/22/20  Patient running a fever or have signs/symptoms of infection? No  Patient currently on antibiotics for the treatment of infection? No  Patient have any upcoming invasive procedures/surgeries? No  Objective: CMP     Component Value Date/Time   NA 138 12/05/2020 1359   NA 141 11/22/2016 1228   K 4.7 12/05/2020 1359   K 4.2 11/22/2016 1228   CL 101 12/05/2020 1359   CO2 27 12/05/2020 1359   CO2 28 11/22/2016 1228   GLUCOSE 92 12/05/2020 1359   GLUCOSE 97 11/22/2016 1228   BUN 10 12/05/2020 1359   BUN 9.1 11/22/2016 1228   CREATININE 0.75 12/05/2020 1359   CREATININE 0.8 11/22/2016 1228   CALCIUM 9.5 12/05/2020 1359   CALCIUM 9.4 11/22/2016 1228   PROT 6.4 12/05/2020 1359   PROT 7.1 11/22/2016 1228   ALBUMIN 4.3 11/24/2020 0841   ALBUMIN 3.5 11/22/2016 1228   AST 15 12/05/2020 1359   AST 19 11/29/2019 0843   AST 23 11/22/2016 1228   ALT 17 12/05/2020 1359   ALT 19 11/29/2019 0843   ALT 27 11/22/2016 1228   ALKPHOS 81 11/24/2020 0841   ALKPHOS 69 11/22/2016 1228   BILITOT 0.5 12/05/2020 1359   BILITOT 0.3 11/29/2019 0843   BILITOT 0.42 11/22/2016 1228   GFRNONAA 99 06/16/2020 1405   GFRAA 115 06/16/2020 1405    CBC    Component Value Date/Time   WBC 5.8 12/05/2020 1359   RBC 4.52 12/05/2020 1359   HGB 13.7 12/05/2020 1359   HGB 12.8 11/29/2019 0843   HGB 13.4 11/22/2016 1228   HCT 40.7 12/05/2020 1359   HCT 40.3 11/22/2016 1228   PLT 252 12/05/2020 1359   PLT 239 11/29/2019 0843   PLT 269 11/22/2016 1228   MCV 90.0 12/05/2020 1359   MCV 95.0 11/22/2016 1228   MCH 30.3 12/05/2020 1359   MCHC 33.7 12/05/2020 1359   RDW 12.7 12/05/2020 1359   RDW 12.5 11/22/2016 1228   LYMPHSABS 2,175 12/05/2020 1359   LYMPHSABS 2.2 11/22/2016 1228   MONOABS 0.5  03/27/2020 0947   MONOABS 0.6 11/22/2016 1228   EOSABS 244 12/05/2020 1359   EOSABS 0.2 11/22/2016 1228   BASOSABS 58 12/05/2020 1359   BASOSABS 0.1 11/22/2016 1228    Baseline Immunosuppressant Therapy Labs TB GOLD Quantiferon TB Gold Latest Ref Rng & Units 03/19/2020  Quantiferon TB Gold Plus NEGATIVE NEGATIVE   Hepatitis Panel Hepatitis Latest Ref Rng & Units 03/19/2020  Hep B Surface Ag NON-REACTI NON-REACTIVE  Hep B IgM NON-REACTI NON-REACTIVE  Hep C Ab NON-REACTI -  Hep C Ab NON-REACTI -   HIV Lab Results  Component Value Date   HIV NON-REACTIVE 03/19/2020   HIV NONREACTIVE 05/02/2015   Immunoglobulins Immunoglobulin Electrophoresis Latest Ref Rng & Units 03/19/2020  IgA  47 - 310 mg/dL 40(L)  IgG 600 - 1,640 mg/dL 955  IgM 50 - 300 mg/dL 197   SPEP Serum Protein Electrophoresis Latest Ref Rng & Units 12/05/2020  Total Protein 6.1 - 8.1 g/dL 6.4  Albumin 3.8 - 4.8 g/dL -  Alpha-1 0.2 - 0.3 g/dL -  Alpha-2 0.5 - 0.9 g/dL -  Beta Globulin 0.4 - 0.6 g/dL -  Beta 2 0.2 - 0.5 g/dL -  Gamma Globulin 0.8 - 1.7 g/dL -  Chest x-ray: 03/19/20 - No active cardiopulmonary disease  Assessment/Plan:  Demonstrated proper injection technique with Taltz demo device  Patient able to demonstrate proper injection technique using the teach back method.  Patient self injected in the right and left thigh with:  Sample Medication: Taltz 80mg /mL autoinjector x 2 = 160mg  Lot: [id number] Expiration: 08/10/2022  Patient tolerated well.  Observed for 30 mins in office for adverse reaction and some redness at injection site noted.   Patient is to return in 1 month for labs and 6-8 weeks for follow-up appointment.  Standing orders placed.   Taltz approved through insurance .   Rx sent to: [facility]: [phone] .  Patient provided with pharmacy phone number and advised to call later this week to schedule shipment to home.  She will continue Taltz for psoriatic arthritis and  plaque psoriasis overlap load of 160 mg (received in clinic today), then 80 mg on weeks 2, 4, 6, 8, 10, 12, then 80 mg every 28 days  . Patient provided with calendar with dose due dates.  All questions encouraged and answered. Instructed patient to call with any further questions or concerns.  Knox Saliva, PharmD, MPH, BCPS Clinical Pharmacist (Rheumatology and Pulmonology)  02/24/2021 10:01 AM

## 2021-03-20 ENCOUNTER — Other Ambulatory Visit (HOSPITAL_COMMUNITY): Payer: Self-pay

## 2021-03-20 ENCOUNTER — Telehealth: Payer: Self-pay | Admitting: Pharmacist

## 2021-03-20 NOTE — Telephone Encounter (Signed)
Submitted a Prior Authorization RENEWAL request to [facility] for TALTZ via CoverMyMeds. Will update once we receive a response.  Key: [id number]  Per CMM response, must process PA through [employer]. Attempted to complete an urgent authorization, but portal keeps stating error occurred. Will have to re-attempt   Case ID: [id number]  ATC [employer] but on hold for > 30 minutes waiting for rep. Will call again on Monday, 03/23/21.  Phone: [phone]  Knox Saliva, PharmD, MPH, BCPS Clinical Pharmacist (Rheumatology and Pulmonology)

## 2021-03-24 ENCOUNTER — Ambulatory Visit: Payer: Commercial Managed Care - PPO | Admitting: Physician Assistant

## 2021-03-24 ENCOUNTER — Other Ambulatory Visit: Payer: Self-pay | Admitting: *Deleted

## 2021-03-24 DIAGNOSIS — Z79899 Other long term (current) drug therapy: Secondary | ICD-10-CM

## 2021-03-24 DIAGNOSIS — L4050 Arthropathic psoriasis, unspecified: Secondary | ICD-10-CM

## 2021-03-24 NOTE — Telephone Encounter (Signed)
Per [employer] portal, prior authorization is in process.    Knox Saliva, PharmD, MPH, BCPS Clinical Pharmacist (Rheumatology and Pulmonology)

## 2021-03-24 NOTE — Progress Notes (Signed)
Office Visit Note  Patient: Erica Woods             Date of Birth: April 22, 1965           MRN: [id number]             PCP: Leone Haven, MD Referring: Leone Haven, MD Visit Date: 04/07/2021 Occupation: @GUAROCC @  Subjective:  Medication monitoring  History of Present Illness: Erica Woods is a 56 y.o. female with history of psoriatic arthritis and osteoarthritis.  Patient initiated Donnetta Hail on 02/24/2021.  She is tolerating taltz without any side effects or injection site reactions. She previously had an inadequate response to Tremfya.  She reports over the past 3 weeks she has started to have a flare of psoriasis on both upper extremities especially her right hand.  She has been using triamcinolone cream topically as needed and aquaphor.  She attributes this flare of psoriasis due to weather changes.  She continues to experience pain and stiffness involving multiple joints including both shoulders, both hands, both knees, and both feet.  She has ongoing discomfort in both SI joints as well as in both trochanteric bursa.  She has been having difficulty sleeping at night due to nocturnal pain especially in both hands.  She denies any recent infections.    Activities of Daily Living:  Patient reports morning stiffness for 2 hours  Patient Reports nocturnal pain.  Difficulty dressing/grooming: Denies Difficulty climbing stairs: Reports Difficulty getting out of chair: Reports Difficulty using hands for taps, buttons, cutlery, and/or writing: Reports  Review of Systems  Constitutional:  Negative for fatigue.  HENT:  Negative for mouth sores, mouth dryness and nose dryness.   Eyes:  Negative for pain, visual disturbance and dryness.  Respiratory:  Negative for cough, hemoptysis, shortness of breath and difficulty breathing.   Cardiovascular:  Negative for chest pain, palpitations, hypertension and swelling in legs/feet.  Gastrointestinal:  Negative for blood in stool,  constipation and diarrhea.  Endocrine: Negative for increased urination.  Genitourinary:  Negative for painful urination.  Musculoskeletal:  Positive for joint pain, joint pain, joint swelling and morning stiffness. Negative for myalgias, muscle weakness, muscle tenderness and myalgias.  Skin:  Positive for rash. Negative for color change, pallor, hair loss, nodules/bumps, skin tightness, ulcers and sensitivity to sunlight.  Allergic/Immunologic: Negative for susceptible to infections.  Neurological:  Negative for dizziness, numbness, headaches and weakness.  Hematological:  Negative for swollen glands.  Psychiatric/Behavioral:  Positive for sleep disturbance. Negative for depressed mood. The patient is not nervous/anxious.    PMFS History:  Patient Active Problem List   Diagnosis Date Noted   Sore throat 01/05/2021   Postmenopausal bleeding 11/24/2020   Rash 03/26/2020   Decreased energy 03/26/2020   Prediabetes 09/20/2018   Pain of right hand 09/20/2018   Stress 11/14/2017   Hot flashes 07/29/2017   Hyperlipidemia 07/29/2017   Hypothyroidism 11/08/2016   Muscle cramps 11/08/2016   Plantar fasciitis, left 07/05/2016   Psoriasis (a type of skin inflammation) 07/05/2016   Arthralgia of both hands 05/04/2016   History of breast cancer 11/19/2015   Encounter for general adult medical examination with abnormal findings 05/02/2015   Migraines 11/25/2014   GERD (gastroesophageal reflux disease) 11/25/2014   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 11/22/2014    Past Medical History:  Diagnosis Date   Allergic rhinitis    Breast cancer ([place])    Chickenpox    GERD (gastroesophageal reflux disease)    Headache  Hemorrhoids    History of blood transfusion    Hypercholesteremia    Hyperthyroidism     Family History  Problem Relation Age of Onset   Diabetes Mother    Heart attack Father    Hypertension Other    Diabetes Other    Diabetes Sister    Cancer Sister     Tuberculosis Brother    Irregular heart beat Brother    Parkinson's disease Sister    Healthy Son    Irregular heart beat Son    Healthy Son    Past Surgical History:  Procedure Laterality Date   ABDOMINAL SURGERY     APPENDECTOMY     BALLOON DILATION N/A 02/04/2017   Procedure: [place];  Surgeon: [name], MD;  Location[facility] ENDOSCOPY;  Service: Gastroenterology;  Laterality: N/A;   BREAST BIOPSY Left 10/02/2014   Procedure: LEFT BREAST BIOPSY AFTER NEEDLE LOCALIZATION X TWO;  Surgeon: [name], MD;  Location: [facility] ORS;  Service: General;  Laterality: Left;   BREAST LUMPECTOMY Left 2016   benign   COLONOSCOPY N/A 10/23/2014   Procedure: COLONOSCOPY;  Surgeon: Rogene Houston, MD;  Location: [facility] ENDO SUITE;  Service: Endoscopy;  Laterality: N/A;  730   ESOPHAGOGASTRODUODENOSCOPY (EGD) WITH PROPOFOL N/A 02/04/2017   Procedure: ESOPHAGOGASTRODUODENOSCOPY (EGD) WITH PROPOFOL;  Surgeon: [name], MD;  Location[facility] ENDOSCOPY;  Service: Gastroenterology;  Laterality: N/A;   LAPAROSCOPIC APPENDECTOMY  03/02/2012   Procedure: APPENDECTOMY LAPAROSCOPIC;  Surgeon: [name], MD;  Location: [facility] ORS;  Service: General;  Laterality: N/A;   OVARIAN CYST REMOVAL Left    TUBAL LIGATION     WISDOM TOOTH EXTRACTION Bilateral    Social History   Social History Narrative   Not on file   Immunization History  Administered Date(s) Administered   Influenza,inj,Quad PF,6+ Mos 11/14/2017, 11/24/2020   Influenza-Unspecified 11/17/2015, 12/12/2018, 12/03/2019   Moderna Sars-Covid-2 Vaccination 06/05/2019, 07/03/2019, 01/18/2020, 07/04/2020   PNEUMOCOCCAL CONJUGATE-20 11/24/2020   Tdap 05/02/2015   Zoster Recombinat (Shingrix) 05/09/2017, 07/12/2017     Objective: Vital Signs: BP 134/89 (BP Location: Left Arm, Patient Position: Sitting, Cuff Size: Normal)    Pulse 71    Ht 5\' 4"  (1.626 m)    Wt 157 lb 6.4 oz (71.4 kg)    LMP 10/09/2014    BMI 27.02 kg/m    Physical  Exam Vitals and nursing note reviewed.  Constitutional:      Appearance: She is well-developed.  HENT:     Head: Normocephalic and atraumatic.  Eyes:     Conjunctiva/sclera: Conjunctivae normal.  Cardiovascular:     Rate and Rhythm: Normal rate and regular rhythm.     Heart sounds: Normal heart sounds.  Pulmonary:     Effort: Pulmonary effort is normal.     Breath sounds: Normal breath sounds.  Abdominal:     General: Bowel sounds are normal.     Palpations: Abdomen is soft.  Musculoskeletal:     Cervical back: Normal range of motion.  Lymphadenopathy:     Cervical: No cervical adenopathy.  Skin:    General: Skin is warm and dry.     Capillary Refill: Capillary refill takes less than 2 seconds.  Neurological:     Mental Status: She is alert and oriented to person, place, and time.  Psychiatric:        Behavior: Behavior normal.     Musculoskeletal Exam: C-spine has good range of motion.  Discomfort in the lumbar spine.  Tenderness over  both SI joints.  Shoulder joints have painful range of motion with stiffness bilaterally.  Elbow joints, wrist joints, MCPs, PIPs, DIPs have good range of motion with no synovitis.  She is able to make a complete fist bilaterally.  Tenderness over PIP and DIP joints but no inflammation was noted.  Hip joints have good range of motion with no groin pain.  Tenderness palpation over bilateral trochanteric bursa.  Knee joints have good range of motion with no warmth or effusion.  Ankle joints have good range of motion with no tenderness or joint swelling.  No tenderness over the Achilles tendon or plantar fascia.  CDAI Exam: CDAI Score: 1.3  Patient Global: 8 mm; Provider Global: 5 mm Swollen: 0 ; Tender: 0  Joint Exam 04/07/2021   No joint exam has been documented for this visit   There is currently no information documented on the homunculus. Go to the Rheumatology activity and complete the homunculus joint exam.  Investigation: No additional  findings.  Imaging: No results found.  Recent Labs: Lab Results  Component Value Date   WBC 6.8 03/24/2021   HGB 13.7 03/24/2021   PLT 247 03/24/2021   NA 139 03/24/2021   K 4.7 03/24/2021   CL 101 03/24/2021   CO2 29 03/24/2021   GLUCOSE 95 03/24/2021   BUN 14 03/24/2021   CREATININE 0.72 03/24/2021   BILITOT 0.5 03/24/2021   ALKPHOS 81 11/24/2020   AST 23 03/24/2021   ALT 24 03/24/2021   PROT 7.1 03/24/2021   ALBUMIN 4.3 11/24/2020   CALCIUM 9.5 03/24/2021   GFRAA 115 06/16/2020   QFTBGOLDPLUS NEGATIVE 03/24/2021    Speciality Comments: MTX tabs 04/14/20-07/26/20 (stopped d/t nausea from folic acid); switched to Tremfya 08/04/20  Procedures:  No procedures performed Allergies: Molnupiravir and Gold-containing drug products    Assessment / Plan:     Visit Diagnoses: Psoriatic arthritis ([place]): No synovitis or dactylitis was noted on examination today.  She presents today with ongoing pain and stiffness involving multiple joints including both shoulders, both hands, both knees, and both feet.  She has tenderness of patient over both SI joints and over the trochanteric bursa of both hips.  She was started on Taltz on 02/24/2021 and has been tolerating without any side effects or injection site reactions.  She has not had any infections since initiating Taltz.  She previously had an inadequate response to Tremfya.  She remains on the loading dose of taltz but has not noticed any improvement in her symptoms yet. She has been having a flare of psoriasis for the past 3 weeks and has been using triamcinolone cream topically and Aquaphor as needed.  Discussed that she should get Donnetta Hail more time to assess the full efficacy.  A prednisone taper starting at 20 mg tapering by 5 mg every 4 days but sent to the pharmacy.  She was advised to notify us if she develops any new or worsening symptoms.  She will follow-up in the office in 6 to 8 weeks to reassess her response.   Psoriasis: She has  been having a flare of psoriasis on both upper extremities for the past 3 weeks.  She has been using triamcinolone cream and Aquaphor topically as needed.  She was advised to remain on Taltz as prescribed.  High risk medication use - Taltz-initiated on 02/24/2021., previously on tremfya. She was advised to discontinue methotrexate since she is not willing to retry taking folic acid as prescribed. CBC and CMP within normal limits  on 03/24/2021.  Her next lab work will be due in May and every 3 months to monitor for toxicity.  Standing orders for CBC and CMP remain in place.  TB Gold negative on 03/24/2021 and will continue to be monitored yearly. Discussed the importance of holding Taltz if she develops signs or symptoms of an infection and to resume once the infection has completely cleared.  Primary osteoarthritis of both hands: She has ongoing pain and stiffness in both hands.  She has tenderness over PIP and DIP joints but no synovitis or dactylitis was noted.  She was able to make a complete fist bilaterally.  Chronic right SI joint pain - X-ray was unremarkable.  She has tenderness palpation over both SI joints, right greater than left.  She declined a cortisone injection today.  A prednisone taper starting at 20 mg tapering by 5 mg every 4 days was sent to the pharmacy.  Plantar fasciitis, left - History of recurrent plantar fasciitis for the last 3 years.  She has undergone cortisone injections in the past.  Discussed the importance of wearing proper fitting shoes.  Trochanteric bursitis of both hips: She has tenderness palpation over bilateral trochanteric bursa.  She has difficulty sleeping at night due to nocturnal pain.  Different treatment options were discussed today.  She was encouraged to perform home exercises on daily basis.  I also discussed referral to physical therapy but she would like to hold off at this time.  I also discussed proceeding with a cortisone injection but she declined at  this time.  Primary osteoarthritis of both feet - Osteoarthritis was noted on the x-rays.  History of right ankle joint fracture in the past.  She continues to have chronic pain in both feet she experiences intermittent discomfort due to planter fasciitis of the left foot.  Discussed the importance of wearing proper fitting shoes.  Arthropathy of lumbar facet joint - History of lower back painx 10 years.  History of right-sided sciatica.  X-ray showed mild facet joint arthropathy.  She has ongoing discomfort in her lower back.  No midline spinal tenderness was noted.  No symptoms of radiculopathy at this time.  Other medical conditions are listed as follows:   Vitamin D deficiency  Prediabetes  Lobular carcinoma in situ (LCIS) of left breast  History of gastroesophageal reflux (GERD)  History of hypothyroidism  Hx of migraines  History of hyperlipidemia  Orders: No orders of the defined types were placed in this encounter.    Follow-Up Instructions: Return in about 6 weeks (around 05/19/2021) for Psoriatic arthritis, Osteoarthritis.   Ofilia Neas, PA-C  Note - This record has been created using Dragon software.  Chart creation errors have been sought, but may not always  have been located. Such creation errors do not reflect on  the standard of medical care.

## 2021-03-25 NOTE — Progress Notes (Signed)
CBC and CMP normal

## 2021-03-26 NOTE — Telephone Encounter (Signed)
Received fax from [employer] requesting further information showing positive response to Taltz. However patient started Taltz in Janaury 2023 and unable to determine response. Will send chart notes and clearly notate that patient is still on titration dose.  Fax: [phone] Phone: [phone] EOC ID: [id number]  Knox Saliva, PharmD, MPH, BCPS Clinical Pharmacist (Rheumatology and Pulmonology)

## 2021-03-27 ENCOUNTER — Other Ambulatory Visit (HOSPITAL_COMMUNITY): Payer: Self-pay

## 2021-03-27 LAB — COMPLETE METABOLIC PANEL WITH GFR
AG Ratio: 1.5 (calc) (ref 1.0–2.5)
ALT: 24 U/L (ref 6–29)
AST: 23 U/L (ref 10–35)
Albumin: 4.3 g/dL (ref 3.6–5.1)
Alkaline phosphatase (APISO): 81 U/L (ref 37–153)
BUN: 14 mg/dL (ref 7–25)
CO2: 29 mmol/L (ref 20–32)
Calcium: 9.5 mg/dL (ref 8.6–10.4)
Chloride: 101 mmol/L (ref 98–110)
Creat: 0.72 mg/dL (ref 0.50–1.03)
Globulin: 2.8 g/dL (calc) (ref 1.9–3.7)
Glucose, Bld: 95 mg/dL (ref 65–99)
Potassium: 4.7 mmol/L (ref 3.5–5.3)
Sodium: 139 mmol/L (ref 135–146)
Total Bilirubin: 0.5 mg/dL (ref 0.2–1.2)
Total Protein: 7.1 g/dL (ref 6.1–8.1)
eGFR: 98 mL/min/{1.73_m2} (ref >=60)

## 2021-03-27 LAB — CBC WITH DIFFERENTIAL/PLATELET
Absolute Monocytes: 666 cells/uL (ref 200–950)
Basophils Absolute: 41 cells/uL (ref 0–200)
Basophils Relative: 0.6 %
Eosinophils Absolute: 204 cells/uL (ref 15–500)
Eosinophils Relative: 3 %
HCT: 40.7 % (ref 35.0–45.0)
Hemoglobin: 13.7 g/dL (ref 11.7–15.5)
Lymphs Abs: 2305 cells/uL (ref 850–3900)
MCH: 30.4 pg (ref 27.0–33.0)
MCHC: 33.7 g/dL (ref 32.0–36.0)
MCV: 90.4 fL (ref 80.0–100.0)
MPV: 11.1 fL (ref 7.5–12.5)
Monocytes Relative: 9.8 %
Neutro Abs: 3584 cells/uL (ref 1500–7800)
Neutrophils Relative %: 52.7 %
Platelets: 247 10*3/uL (ref 140–400)
RBC: 4.5 10*6/uL (ref 3.80–5.10)
RDW: 13.4 % (ref 11.0–15.0)
Total Lymphocyte: 33.9 %
WBC: 6.8 10*3/uL (ref 3.8–10.8)

## 2021-03-27 LAB — QUANTIFERON-TB GOLD PLUS
Mitogen-NIL: >10 IU/mL
NIL: 0.07 IU/mL
QuantiFERON-TB Gold Plus: NEGATIVE
TB1-NIL: 0.05 IU/mL
TB2-NIL: 0 IU/mL

## 2021-03-27 NOTE — Telephone Encounter (Signed)
Received notification from [facility] regarding a prior authorization for [place]. Authorization request is closed since medication was previously approved through 12/29/21  Patient must continue to fill through [place]: [phone]   Authorization # [id number]  Knox Saliva, PharmD, MPH, BCPS Clinical Pharmacist (Rheumatology and Pulmonology)

## 2021-03-27 NOTE — Progress Notes (Signed)
TB Gold is negative.

## 2021-04-02 NOTE — Addendum Note (Signed)
Addended by: Cassandria Anger on: 04/02/2021 08:48 AM   Modules accepted: Level of Service

## 2021-04-07 ENCOUNTER — Ambulatory Visit: Payer: Commercial Managed Care - PPO | Admitting: Physician Assistant

## 2021-04-07 ENCOUNTER — Encounter: Payer: Self-pay | Admitting: Physician Assistant

## 2021-04-07 ENCOUNTER — Other Ambulatory Visit: Payer: Self-pay

## 2021-04-07 VITALS — BP 134/89 | HR 71 | Ht 64.0 in | Wt 157.4 lb

## 2021-04-07 DIAGNOSIS — M722 Plantar fascial fibromatosis: Secondary | ICD-10-CM

## 2021-04-07 DIAGNOSIS — M7062 Trochanteric bursitis, left hip: Secondary | ICD-10-CM

## 2021-04-07 DIAGNOSIS — M19042 Primary osteoarthritis, left hand: Secondary | ICD-10-CM

## 2021-04-07 DIAGNOSIS — M47816 Spondylosis without myelopathy or radiculopathy, lumbar region: Secondary | ICD-10-CM

## 2021-04-07 DIAGNOSIS — M19041 Primary osteoarthritis, right hand: Secondary | ICD-10-CM

## 2021-04-07 DIAGNOSIS — Z8669 Personal history of other diseases of the nervous system and sense organs: Secondary | ICD-10-CM

## 2021-04-07 DIAGNOSIS — R7303 Prediabetes: Secondary | ICD-10-CM

## 2021-04-07 DIAGNOSIS — L409 Psoriasis, unspecified: Secondary | ICD-10-CM

## 2021-04-07 DIAGNOSIS — L4050 Arthropathic psoriasis, unspecified: Secondary | ICD-10-CM | POA: Diagnosis not present

## 2021-04-07 DIAGNOSIS — M7061 Trochanteric bursitis, right hip: Secondary | ICD-10-CM

## 2021-04-07 DIAGNOSIS — Z79899 Other long term (current) drug therapy: Secondary | ICD-10-CM | POA: Diagnosis not present

## 2021-04-07 DIAGNOSIS — E559 Vitamin D deficiency, unspecified: Secondary | ICD-10-CM

## 2021-04-07 DIAGNOSIS — M19072 Primary osteoarthritis, left ankle and foot: Secondary | ICD-10-CM

## 2021-04-07 DIAGNOSIS — M19071 Primary osteoarthritis, right ankle and foot: Secondary | ICD-10-CM

## 2021-04-07 DIAGNOSIS — Z8719 Personal history of other diseases of the digestive system: Secondary | ICD-10-CM

## 2021-04-07 DIAGNOSIS — Z8639 Personal history of other endocrine, nutritional and metabolic disease: Secondary | ICD-10-CM

## 2021-04-07 DIAGNOSIS — G8929 Other chronic pain: Secondary | ICD-10-CM

## 2021-04-07 DIAGNOSIS — M533 Sacrococcygeal disorders, not elsewhere classified: Secondary | ICD-10-CM

## 2021-04-07 DIAGNOSIS — D0502 Lobular carcinoma in situ of left breast: Secondary | ICD-10-CM

## 2021-04-07 MED ORDER — PREDNISONE 5 MG PO TABS: ORAL_TABLET | ORAL | 0 refills | Status: DC

## 2021-05-12 NOTE — Progress Notes (Signed)
? ?Office Visit Note ? ?Patient: Erica Woods             ?Date of Birth: Feb 23, 1965           ?MRN: [id number]             ?PCP: Leone Haven, MD ?Referring: Leone Haven, MD ?Visit Date: 05/26/2021 ?Occupation: '@GUAROCC'$ @ ? ?Subjective:  ?Medication monitoring  ? ?History of Present Illness: Erica Woods is a 56 y.o. female with history of psoriatic arthritis.  She is on taltz 80 mg sq injections every 4 weeks.  Patient initiated on 02/24/21.  Her last dose of taltz was 05/19/21. Patient reports that she has noticed about a 40% improvement in her joint pain and stiffness since initiating Taltz.  She states that her SI joint discomfort has improved.  She denies any SI joint tenderness at this time.  She denies any plantar fasciitis or Achilles tendinitis.  She denies any nocturnal pain.  She states that about a month ago she had the norovirus at which time she was washing her hands very frequently with antibacterial soap and sanitizer which dried out her hands and worsened her psoriasis.  She states that her psoriasis is gradually started to improve.  She still has some redness of her skin but denies any scaling or itching at this time.  She has been using Aquaphor on a daily basis to moisture.  ? ?Activities of Daily Living:  ?Patient reports morning stiffness for 5 minutes.   ?Patient Denies nocturnal pain.  ?Difficulty dressing/grooming: Denies ?Difficulty climbing stairs: Reports ?Difficulty getting out of chair: Reports ?Difficulty using hands for taps, buttons, cutlery, and/or writing: Reports ? ?Review of Systems  ?Constitutional:  Negative for fatigue.  ?HENT:  Negative for mouth sores, mouth dryness and nose dryness.   ?Eyes:  Positive for dryness. Negative for pain and itching.  ?Respiratory:  Negative for shortness of breath and difficulty breathing.   ?Cardiovascular:  Negative for chest pain and palpitations.  ?Gastrointestinal:  Negative for blood in stool, constipation and diarrhea.   ?Endocrine: Negative for increased urination.  ?Genitourinary:  Negative for difficulty urinating.  ?Musculoskeletal:  Positive for morning stiffness. Negative for joint pain, joint pain, joint swelling, myalgias, muscle tenderness and myalgias.  ?Skin:  Negative for color change, rash and redness.  ?Allergic/Immunologic: Negative for susceptible to infections.  ?Neurological:  Negative for dizziness, numbness, headaches, memory loss and weakness.  ?Hematological:  Negative for bruising/bleeding tendency.  ?Psychiatric/Behavioral:  Negative for confusion.   ? ?PMFS History:  ?Patient Active Problem List  ? Diagnosis Date Noted  ? Vitamin D deficiency 05/25/2021  ? Postmenopausal bleeding 11/24/2020  ? Rash 03/26/2020  ? Prediabetes 09/20/2018  ? Stress 11/14/2017  ? Hot flashes 07/29/2017  ? Hyperlipidemia 07/29/2017  ? Hypothyroidism 11/08/2016  ? Muscle cramps 11/08/2016  ? Plantar fasciitis, left 07/05/2016  ? Psoriasis 07/05/2016  ? Arthralgia of both hands 05/04/2016  ? History of breast cancer 11/19/2015  ? Encounter for general adult medical examination with abnormal findings 05/02/2015  ? Migraines 11/25/2014  ? GERD (gastroesophageal reflux disease) 11/25/2014  ? 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  ?Past Medical History:  ?Diagnosis Date  ? Allergic rhinitis   ? Breast cancer ([place])   ? Chickenpox   ? GERD (gastroesophageal reflux disease)   ? Headache   ? Hemorrhoids   ? History of blood transfusion   ? Hypercholesteremia   ? Hyperthyroidism   ?  ?  Family History  ?Problem Relation Age of Onset  ? Diabetes Mother   ? Heart attack Father   ? Hypertension Other   ? Diabetes Other   ? Diabetes Sister   ? Cancer Sister   ? Tuberculosis Brother   ? Irregular heart beat Brother   ? Parkinson's disease Sister   ? Healthy Son   ? Irregular heart beat Son   ? Healthy Son   ? ?Past Surgical History:  ?Procedure Laterality Date  ? ABDOMINAL SURGERY    ? APPENDECTOMY    ? BALLOON DILATION  N/A 02/04/2017  ? Procedure: BALLOON DILATION;  Surgeon: [name], MD;  Location: [facility] ENDOSCOPY;  Service: Gastroenterology;  Laterality: N/A;  ? BREAST BIOPSY Left 10/02/2014  ? Procedure: LEFT BREAST BIOPSY AFTER NEEDLE LOCALIZATION X TWO;  Surgeon: [name], MD;  Location: [facility] ORS;  Service: General;  Laterality: Left;  ? BREAST LUMPECTOMY Left 2016  ? benign  ? COLONOSCOPY N/A 10/23/2014  ? Procedure: COLONOSCOPY;  Surgeon: Rogene Houston, MD;  Location: [facility] ENDO SUITE;  Service: Endoscopy;  Laterality: N/A;  730  ? ESOPHAGOGASTRODUODENOSCOPY (EGD) WITH PROPOFOL N/A 02/04/2017  ? Procedure: ESOPHAGOGASTRODUODENOSCOPY (EGD) WITH PROPOFOL;  Surgeon: [name], MD;  Location[facility] ENDOSCOPY;  Service: Gastroenterology;  Laterality: N/A;  ? LAPAROSCOPIC APPENDECTOMY  03/02/2012  ? Procedure: APPENDECTOMY LAPAROSCOPIC;  Surgeon: [name], MD;  Location: [facility] ORS;  Service: General;  Laterality: N/A;  ? OVARIAN CYST REMOVAL Left   ? TUBAL LIGATION    ? WISDOM TOOTH EXTRACTION Bilateral   ? ?Social History  ? ?Social History Narrative  ? Not on file  ? ?Immunization History  ?Administered Date(s) Administered  ? Influenza,inj,Quad PF,6+ Mos 11/14/2017, 11/24/2020  ? Influenza-Unspecified 11/17/2015, 12/12/2018, 12/03/2019  ? Moderna Sars-Covid-2 Vaccination 06/05/2019, 07/03/2019, 01/18/2020, 07/04/2020  ? PNEUMOCOCCAL CONJUGATE-20 11/24/2020  ? Tdap 05/02/2015  ? Zoster Recombinat (Shingrix) 05/09/2017, 07/12/2017  ?  ? ?Objective: ?Vital Signs: BP 129/81 (BP Location: Left Arm, Patient Position: Sitting, Cuff Size: Normal)   Pulse (!) 58   Ht '5\' 4"'$  (1.626 m)   Wt 161 lb 12.8 oz (73.4 kg)   LMP 10/09/2014   BMI 27.77 kg/m?   ? ?Physical Exam ?Vitals and nursing note reviewed.  ?Constitutional:   ?   Appearance: She is well-developed.  ?HENT:  ?   Head: Normocephalic and atraumatic.  ?Eyes:  ?   Conjunctiva/sclera: Conjunctivae normal.  ?Cardiovascular:  ?   Rate and Rhythm: Normal rate and regular  rhythm.  ?   Heart sounds: Normal heart sounds.  ?Pulmonary:  ?   Effort: Pulmonary effort is normal.  ?   Breath sounds: Normal breath sounds.  ?Abdominal:  ?   General: Bowel sounds are normal.  ?   Palpations: Abdomen is soft.  ?Musculoskeletal:  ?   Cervical back: Normal range of motion.  ?Skin: ?   General: Skin is warm and dry.  ?   Capillary Refill: Capillary refill takes less than 2 seconds.  ?Neurological:  ?   Mental Status: She is alert and oriented to person, place, and time.  ?Psychiatric:     ?   Behavior: Behavior normal.  ?  ? ?Musculoskeletal Exam: C-spine, thoracic spine, lumbar spine have good range of motion.  No midline spinal tenderness or SI joint tenderness.  Shoulder joints, elbow joints, wrist joints, MCPs, PIPs, DIPs have good range of motion with no synovitis.  She was able to make a complete fist bilaterally.  Hip  joints have good range of motion with no groin pain.  Knee joints have good range of motion with no warmth or effusion.  Ankle joints have good range of motion with no tenderness or joint swelling.  No evidence of Achilles tendinitis or plantar fasciitis. ? ?CDAI Exam: ?CDAI Score: -- ?Patient Global: --; Provider Global: -- ?Swollen: --; Tender: -- ?Joint Exam 05/26/2021  ? ?No joint exam has been documented for this visit  ? ?There is currently no information documented on the homunculus. Go to the Rheumatology activity and complete the homunculus joint exam. ? ?Investigation: ?No additional findings. ? ?Imaging: ?No results found. ? ?Recent Labs: ?Lab Results  ?Component Value Date  ? WBC 6.8 03/24/2021  ? HGB 13.7 03/24/2021  ? PLT 247 03/24/2021  ? NA 139 03/24/2021  ? K 4.7 03/24/2021  ? CL 101 03/24/2021  ? CO2 29 03/24/2021  ? GLUCOSE 95 03/24/2021  ? BUN 14 03/24/2021  ? CREATININE 0.72 03/24/2021  ? BILITOT 0.5 03/24/2021  ? ALKPHOS 81 11/24/2020  ? AST 23 03/24/2021  ? ALT 24 03/24/2021  ? PROT 7.1 03/24/2021  ? ALBUMIN 4.3 11/24/2020  ? CALCIUM 9.5 03/24/2021  ?  GFRAA 115 06/16/2020  ? QFTBGOLDPLUS NEGATIVE 03/24/2021  ? ? ?Speciality Comments: MTX tabs 04/14/20-07/26/20 (stopped d/t nausea from folic acid); switched to Tremfya 08/04/20 ? ?Procedures:  ?No procedures performed ?A

## 2021-05-14 ENCOUNTER — Other Ambulatory Visit: Payer: Self-pay | Admitting: Rheumatology

## 2021-05-14 DIAGNOSIS — L409 Psoriasis, unspecified: Secondary | ICD-10-CM

## 2021-05-14 DIAGNOSIS — Z79899 Other long term (current) drug therapy: Secondary | ICD-10-CM

## 2021-05-14 DIAGNOSIS — L4050 Arthropathic psoriasis, unspecified: Secondary | ICD-10-CM

## 2021-05-14 NOTE — Telephone Encounter (Signed)
Next Visit: 05/26/2021 ? ?Last Visit: 04/07/2021 ? ?Last Fill: 02/24/2021 ? ?EU:MPNTIRWER arthritis  ? ?Current Dose per office note 04/07/2021: Taltz-initiated on 02/24/2021 ? ?Labs: 03/24/2021 CBC and CMP normal. ? ?TB Gold: 03/24/2021 Neg   ? ?Okay to refill Donnetta Hail?  ?

## 2021-05-15 ENCOUNTER — Other Ambulatory Visit: Payer: Self-pay

## 2021-05-15 DIAGNOSIS — E039 Hypothyroidism, unspecified: Secondary | ICD-10-CM

## 2021-05-15 MED ORDER — THYROID 30 MG PO TABS: ORAL_TABLET | ORAL | 1 refills | Status: DC

## 2021-05-25 ENCOUNTER — Ambulatory Visit: Payer: Commercial Managed Care - PPO | Admitting: Family Medicine

## 2021-05-25 ENCOUNTER — Encounter: Payer: Self-pay | Admitting: Family Medicine

## 2021-05-25 VITALS — BP 110/70 | HR 66 | Temp 98.7°F | Ht 64.0 in | Wt 160.6 lb

## 2021-05-25 DIAGNOSIS — E039 Hypothyroidism, unspecified: Secondary | ICD-10-CM | POA: Diagnosis not present

## 2021-05-25 DIAGNOSIS — E559 Vitamin D deficiency, unspecified: Secondary | ICD-10-CM | POA: Diagnosis not present

## 2021-05-25 DIAGNOSIS — R1310 Dysphagia, unspecified: Secondary | ICD-10-CM | POA: Diagnosis not present

## 2021-05-25 DIAGNOSIS — L409 Psoriasis, unspecified: Secondary | ICD-10-CM

## 2021-05-25 DIAGNOSIS — K219 Gastro-esophageal reflux disease without esophagitis: Secondary | ICD-10-CM | POA: Diagnosis not present

## 2021-05-25 LAB — TSH: TSH: 1.72 u[IU]/mL (ref 0.35–5.50)

## 2021-05-25 LAB — VITAMIN D 25 HYDROXY (VIT D DEFICIENCY, FRACTURES): VITD: 41.43 ng/mL (ref 30.00–100.00)

## 2021-05-25 MED ORDER — OMEPRAZOLE 40 MG PO CPDR: 40.0000 mg | DELAYED_RELEASE_CAPSULE | Freq: Every day | ORAL | 3 refills | Status: DC

## 2021-05-25 NOTE — Assessment & Plan Note (Signed)
Check vitamin D.  She will continue vitamin D 2000 international units once daily. ?

## 2021-05-25 NOTE — Patient Instructions (Signed)
Nice to see you. ?We will get lab work today and contact you with the results. ?Please call your GI physician to schedule follow-up. ?

## 2021-05-25 NOTE — Progress Notes (Signed)
?Tommi Rumps, MD ?Phone: [phone] ? ?Erica Woods is a 56 y.o. female who presents today for follow-up. ? ?HYPOTHYROIDISM ?Disease Monitoring ?Weight changes: no  Skin Changes: no Heat/Cold intolerance: no ? ?Medication Monitoring ?Compliance:  taking NP thyroid ?Last TSH:   ?Lab Results  ?Component Value Date  ? TSH 1.47 11/24/2020  ? ?GERD:   ?Reflux symptoms: yes, intermittent burning that does respond to OTC omeprazole, though recurs about 1 month after finishing the over the counter dose   ?Abd pain: no   ?Blood in stool: no  ?Dysphagia: yes, occasionally feels like food get stuckin her lower esophagus   ?EGD: 02/04/17 -nonobstructing Schatzki ring that was dilated  ?Medication: Intermittent use of over-the-counter omeprazole ? ?Vitamin D deficiency: Patient notes she is now just on 1 over-the-counter vitamin D tablet daily.  Notes that she believes this is 2000 international units. ? ?Psoriasis: Patient reports this is getting better.  She has been on Taltz and feels as though she is tolerating this well.  She follows up with rheumatology tomorrow.  She does not see dermatology. ? ? ?Social History  ? ?Tobacco Use  ?Smoking Status Never  ?Smokeless Tobacco Never  ? ? ?Current Outpatient Medications on File Prior to Visit  ?Medication Sig Dispense Refill  ? aspirin-acetaminophen-caffeine (EXCEDRIN MIGRAINE) 250-250-65 MG tablet Take 1 tablet by mouth every 6 (six) hours as needed for headache. 30 tablet 0  ? Ixekizumab (TALTZ) 80 MG/ML SOAJ INJECT '80MG'$  SUBCUTANEOUSLY AT  WEEKS 10 AND 12 THEN EVERY 4  WEEKS THEREAFTER 3 mL 0  ? predniSONE (DELTASONE) 5 MG tablet Take 4 tablets by mouth daily x4 days, 3 tablets daily x4 days, 2 tablets daily x4 days, 1 tablet daily x4 days. 40 tablet 0  ? thyroid (NP THYROID) 30 MG tablet TAKE 1 & 1/2 (ONE & ONE-HALF) TABLETS BY MOUTH ONCE DAILY BEFORE BREAKFAST 135 tablet 1  ? triamcinolone cream (KENALOG) 0.1 % Apply 1 application topically 2 (two) times daily. 30 g 0   ? VITAMIN D PO Take by mouth daily.    ? ?Current Facility-Administered Medications on File Prior to Visit  ?Medication Dose Route Frequency Provider Last Rate Last Admin  ? cyanocobalamin ((VITAMIN B-12)) injection 1,000 mcg  1,000 mcg Intramuscular Once Leone Haven, MD      ? ? ? ?ROS see history of present illness ? ?Objective ? ?Physical Exam ?Vitals:  ? 05/25/21 0810  ?BP: 110/70  ?Pulse: 66  ?Temp: 98.7 ?F (37.1 ?C)  ?SpO2: 97%  ? ? ?BP Readings from Last 3 Encounters:  ?05/25/21 110/70  ?04/07/21 134/89  ?02/24/21 118/79  ? ?Wt Readings from Last 3 Encounters:  ?05/25/21 160 lb 9.6 oz (72.8 kg)  ?04/07/21 157 lb 6.4 oz (71.4 kg)  ?01/05/21 153 lb (69.4 kg)  ? ? ?Physical Exam ?Constitutional:   ?   General: She is not in acute distress. ?   Appearance: She is not diaphoretic.  ?Cardiovascular:  ?   Rate and Rhythm: Normal rate and regular rhythm.  ?   Heart sounds: Normal heart sounds.  ?Pulmonary:  ?   Effort: Pulmonary effort is normal.  ?   Breath sounds: Normal breath sounds.  ?Skin: ?   General: Skin is warm and dry.  ?Neurological:  ?   Mental Status: She is alert.  ? ? ? ?Assessment/Plan: Please see individual problem list. ? ?Problem List Items Addressed This Visit   ? ? GERD (gastroesophageal reflux disease)  ?  Inadequately  controlled.  We will restart her omeprazole 40 mg once daily.  She reported she would contact GI to schedule follow-up.  I encouraged her to do so so that she could be evaluated for the occasional dysphagia. ?  ?  ? Relevant Medications  ? omeprazole (PRILOSEC) 40 MG capsule  ? Hypothyroidism - Primary  ?  Check TSH.  Continue NP thyroid 45 mg daily. ?  ?  ? Relevant Orders  ? TSH  ? Psoriasis  ?  She will continue to follow with her rheumatologist who is managing this issue. ?  ?  ? Vitamin D deficiency  ?  Check vitamin D.  She will continue vitamin D 2000 international units once daily. ?  ?  ? Relevant Orders  ? Vitamin D (25 hydroxy)  ? ?Other Visit Diagnoses   ? ?  Dysphagia, unspecified type      ? Relevant Medications  ? omeprazole (PRILOSEC) 40 MG capsule  ? ?  ? ? ? ?Return in about 6 months (around 11/24/2021) for CPE. ? ?This visit occurred during the SARS-CoV-2 public health emergency.  Safety protocols were in place, including screening questions prior to the visit, additional usage of staff PPE, and extensive cleaning of exam room while observing appropriate contact time as indicated for disinfecting solutions.  ? ? ?Tommi Rumps, MD ?[place] ? ?

## 2021-05-25 NOTE — Assessment & Plan Note (Signed)
She will continue to follow with her rheumatologist who is managing this issue. ?

## 2021-05-25 NOTE — Assessment & Plan Note (Signed)
Inadequately controlled.  We will restart her omeprazole 40 mg once daily.  She reported she would contact GI to schedule follow-up.  I encouraged her to do so so that she could be evaluated for the occasional dysphagia. ?

## 2021-05-25 NOTE — Assessment & Plan Note (Signed)
Check TSH.  Continue NP thyroid 45 mg daily. ?

## 2021-05-26 ENCOUNTER — Ambulatory Visit: Payer: Commercial Managed Care - PPO | Admitting: Physician Assistant

## 2021-05-26 ENCOUNTER — Encounter: Payer: Self-pay | Admitting: Physician Assistant

## 2021-05-26 VITALS — BP 129/81 | HR 58 | Ht 64.0 in | Wt 161.8 lb

## 2021-05-26 DIAGNOSIS — L4050 Arthropathic psoriasis, unspecified: Secondary | ICD-10-CM | POA: Diagnosis not present

## 2021-05-26 DIAGNOSIS — Z79899 Other long term (current) drug therapy: Secondary | ICD-10-CM | POA: Diagnosis not present

## 2021-05-26 DIAGNOSIS — L409 Psoriasis, unspecified: Secondary | ICD-10-CM

## 2021-05-26 DIAGNOSIS — G8929 Other chronic pain: Secondary | ICD-10-CM

## 2021-05-26 DIAGNOSIS — M533 Sacrococcygeal disorders, not elsewhere classified: Secondary | ICD-10-CM

## 2021-05-26 DIAGNOSIS — M7062 Trochanteric bursitis, left hip: Secondary | ICD-10-CM

## 2021-05-26 DIAGNOSIS — Z8639 Personal history of other endocrine, nutritional and metabolic disease: Secondary | ICD-10-CM

## 2021-05-26 DIAGNOSIS — M19042 Primary osteoarthritis, left hand: Secondary | ICD-10-CM

## 2021-05-26 DIAGNOSIS — E559 Vitamin D deficiency, unspecified: Secondary | ICD-10-CM

## 2021-05-26 DIAGNOSIS — M47816 Spondylosis without myelopathy or radiculopathy, lumbar region: Secondary | ICD-10-CM

## 2021-05-26 DIAGNOSIS — M19072 Primary osteoarthritis, left ankle and foot: Secondary | ICD-10-CM

## 2021-05-26 DIAGNOSIS — Z8719 Personal history of other diseases of the digestive system: Secondary | ICD-10-CM

## 2021-05-26 DIAGNOSIS — R7303 Prediabetes: Secondary | ICD-10-CM

## 2021-05-26 DIAGNOSIS — M722 Plantar fascial fibromatosis: Secondary | ICD-10-CM

## 2021-05-26 DIAGNOSIS — Z8669 Personal history of other diseases of the nervous system and sense organs: Secondary | ICD-10-CM

## 2021-05-26 DIAGNOSIS — D0502 Lobular carcinoma in situ of left breast: Secondary | ICD-10-CM

## 2021-05-26 DIAGNOSIS — M19041 Primary osteoarthritis, right hand: Secondary | ICD-10-CM | POA: Diagnosis not present

## 2021-05-26 DIAGNOSIS — M7061 Trochanteric bursitis, right hip: Secondary | ICD-10-CM

## 2021-05-26 DIAGNOSIS — M19071 Primary osteoarthritis, right ankle and foot: Secondary | ICD-10-CM

## 2021-05-26 NOTE — Patient Instructions (Signed)
Standing Labs °We placed an order today for your standing lab work.  ° °Please have your standing labs drawn in May and every 3 months  ° ° °If possible, please have your labs drawn 2 weeks prior to your appointment so that the provider can discuss your results at your appointment. ° °Please note that you may see your imaging and lab results in MyChart before we have reviewed them. °We may be awaiting multiple results to interpret others before contacting you. °Please allow our office up to 72 hours to thoroughly review all of the results before contacting the office for clarification of your results. ° °We have open lab daily: °Monday through Thursday from 1:30-4:30 PM and Friday from 1:30-4:00 PM °at the office of Dr. Shaili Deveshwar, [facility].   °Please be advised, all patients with office appointments requiring lab work will take precedent over walk-in lab work.  °If possible, please come for your lab work on Monday and Friday afternoons, as you may experience shorter wait times. °The office is located at [address], [address], [place], [place] [address] °No appointment is necessary.   °Labs are drawn by [facility]. Please bring your co-pay at the time of your lab draw.  You may receive a bill from [facility] for your lab work. ° °Please note if you are on Hydroxychloroquine and and an order has been placed for a Hydroxychloroquine level, you will need to have it drawn 4 hours or more after your last dose. ° °If you wish to have your labs drawn at another location, please call the office 24 hours in advance to send orders. ° °If you have any questions regarding directions or hours of operation,  °please call [phone].   °As a reminder, please drink plenty of water prior to coming for your lab work. Thanks! ° °

## 2021-06-22 ENCOUNTER — Other Ambulatory Visit: Payer: Self-pay | Admitting: *Deleted

## 2021-06-22 DIAGNOSIS — Z79899 Other long term (current) drug therapy: Secondary | ICD-10-CM

## 2021-06-23 LAB — CBC WITH DIFFERENTIAL/PLATELET
Absolute Monocytes: 521 cells/uL (ref 200–950)
Basophils Absolute: 62 cells/uL (ref 0–200)
Basophils Relative: 1 %
Eosinophils Absolute: 192 cells/uL (ref 15–500)
Eosinophils Relative: 3.1 %
HCT: 43.8 % (ref 35.0–45.0)
Hemoglobin: 14.3 g/dL (ref 11.7–15.5)
Lymphs Abs: 2096 cells/uL (ref 850–3900)
MCH: 30.2 pg (ref 27.0–33.0)
MCHC: 32.6 g/dL (ref 32.0–36.0)
MCV: 92.4 fL (ref 80.0–100.0)
MPV: 11.6 fL (ref 7.5–12.5)
Monocytes Relative: 8.4 %
Neutro Abs: 3329 cells/uL (ref 1500–7800)
Neutrophils Relative %: 53.7 %
Platelets: 257 10*3/uL (ref 140–400)
RBC: 4.74 10*6/uL (ref 3.80–5.10)
RDW: 12.9 % (ref 11.0–15.0)
Total Lymphocyte: 33.8 %
WBC: 6.2 10*3/uL (ref 3.8–10.8)

## 2021-06-23 LAB — COMPLETE METABOLIC PANEL WITH GFR
AG Ratio: 1.7 (calc) (ref 1.0–2.5)
ALT: 16 U/L (ref 6–29)
AST: 14 U/L (ref 10–35)
Albumin: 4.1 g/dL (ref 3.6–5.1)
Alkaline phosphatase (APISO): 78 U/L (ref 37–153)
BUN: 15 mg/dL (ref 7–25)
CO2: 27 mmol/L (ref 20–32)
Calcium: 9.2 mg/dL (ref 8.6–10.4)
Chloride: 106 mmol/L (ref 98–110)
Creat: 0.94 mg/dL (ref 0.50–1.03)
Globulin: 2.4 g/dL (calc) (ref 1.9–3.7)
Glucose, Bld: 115 mg/dL — ABNORMAL HIGH (ref 65–99)
Potassium: 4.7 mmol/L (ref 3.5–5.3)
Sodium: 142 mmol/L (ref 135–146)
Total Bilirubin: 0.3 mg/dL (ref 0.2–1.2)
Total Protein: 6.5 g/dL (ref 6.1–8.1)
eGFR: 71 mL/min/{1.73_m2} (ref >=60)

## 2021-06-23 NOTE — Progress Notes (Signed)
Glucose is 115.  Rest CMP WNL.  CBC WNL.

## 2021-07-06 ENCOUNTER — Other Ambulatory Visit: Payer: Self-pay | Admitting: Rheumatology

## 2021-07-06 MED ORDER — TRIAMCINOLONE ACETONIDE 0.1 % EX CREA: TOPICAL_CREAM | CUTANEOUS | 0 refills | Status: DC

## 2021-07-06 NOTE — Telephone Encounter (Signed)
Next Visit: 09/04/2021  Last Visit: 05/26/2021  Last Fill: 12/23/2020  DX: Psoriasis  Current Dose per office note on 05/26/2021: not discussed.   Okay to refill triamcinolone cream?

## 2021-08-21 NOTE — Progress Notes (Signed)
Office Visit Note  Patient: Erica Woods             Date of Birth: [birth year]           MRN: [id number]             PCP: Leone Haven, MD Referring: Leone Haven, MD Visit Date: 09/04/2021 Occupation: '@GUAROCC'$ @  Subjective:  Medication management  History of Present Illness: Erica Woods is a 56 y.o. female with history of psoriatic arthritis.  She states that she had a good response to [employer] injections.  She has noticed improvement in her rash and also joint pain.  She has occasional SI joint discomfort.  She states she has not been doing the stretching exercises.  She  denies any joint swelling.  There is no history of Planter fasciitis or Achilles tendinitis.  She has been taking injections Taltz 80 mg subcu every 4 weeks since January 2023.  She has not experienced any side effects.  Activities of Daily Living:  Patient reports morning stiffness for a few minutes.   Patient Denies nocturnal pain.  Difficulty dressing/grooming: Denies Difficulty climbing stairs: Denies Difficulty getting out of chair: Denies Difficulty using hands for taps, buttons, cutlery, and/or writing: Denies  Review of Systems  Constitutional:  Negative for fatigue.  HENT:  Negative for mouth sores and mouth dryness.   Eyes:  Positive for dryness.  Respiratory:  Negative for shortness of breath.   Cardiovascular:  Negative for chest pain and palpitations.  Gastrointestinal:  Negative for blood in stool, constipation and diarrhea.  Endocrine: Negative for increased urination.  Genitourinary:  Negative for involuntary urination.  Musculoskeletal:  Positive for morning stiffness. Negative for joint pain, joint pain, joint swelling, myalgias, muscle weakness, muscle tenderness and myalgias.  Skin:  Positive for sensitivity to sunlight. Negative for color change, rash and hair loss.  Allergic/Immunologic: Negative for susceptible to infections.  Neurological:  Negative for dizziness and  headaches.  Hematological:  Negative for swollen glands.  Psychiatric/Behavioral:  Positive for sleep disturbance. Negative for depressed mood. The patient is not nervous/anxious.     PMFS History:  Patient Active Problem List   Diagnosis Date Noted   Vitamin D deficiency 05/25/2021   Postmenopausal bleeding 11/24/2020   Rash 03/26/2020   Prediabetes 09/20/2018   Stress 11/14/2017   Hot flashes 07/29/2017   Hyperlipidemia 07/29/2017   Hypothyroidism 11/08/2016   Muscle cramps 11/08/2016   Plantar fasciitis, left 07/05/2016   Psoriasis 07/05/2016   Arthralgia of both hands 05/04/2016   History of breast cancer 11/19/2015   Encounter for general adult medical examination with abnormal findings 05/02/2015   Migraines 11/25/2014   GERD (gastroesophageal reflux disease) 11/25/2014   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 11/22/2014    Past Medical History:  Diagnosis Date   Allergic rhinitis    Breast cancer ([place])    Chickenpox    GERD (gastroesophageal reflux disease)    Headache    Hemorrhoids    History of blood transfusion    Hypercholesteremia    Hyperthyroidism     Family History  Problem Relation Age of Onset   Diabetes Mother    Heart attack Father    Hypertension Other    Diabetes Other    Diabetes Sister    Cancer Sister    Tuberculosis Brother    Irregular heart beat Brother    Parkinson's disease Sister    Healthy Son    Irregular heart beat Son  Healthy Son    Past Surgical History:  Procedure Laterality Date   ABDOMINAL SURGERY     APPENDECTOMY     BALLOON DILATION N/A 02/04/2017   Procedure: Larrie Kass DILATION;  Surgeon: [name], MD;  Location: [facility] ENDOSCOPY;  Service: Gastroenterology;  Laterality: N/A;   BREAST BIOPSY Left 10/02/2014   Procedure: LEFT BREAST BIOPSY AFTER NEEDLE LOCALIZATION X TWO;  Surgeon: [name], MD;  Location: [facility] ORS;  Service: General;  Laterality: Left;   BREAST LUMPECTOMY Left 2016   benign    COLONOSCOPY N/A 10/23/2014   Procedure: COLONOSCOPY;  Surgeon: Rogene Houston, MD;  Location: [facility] ENDO SUITE;  Service: Endoscopy;  Laterality: N/A;  730   ESOPHAGOGASTRODUODENOSCOPY (EGD) WITH PROPOFOL N/A 02/04/2017   Procedure: ESOPHAGOGASTRODUODENOSCOPY (EGD) WITH PROPOFOL;  Surgeon: [name], MD;  Location: [facility] ENDOSCOPY;  Service: Gastroenterology;  Laterality: N/A;   LAPAROSCOPIC APPENDECTOMY  03/02/2012   Procedure: APPENDECTOMY LAPAROSCOPIC;  Surgeon: [name], MD;  Location: [facility] ORS;  Service: General;  Laterality: N/A;   OVARIAN CYST REMOVAL Left    TUBAL LIGATION     WISDOM TOOTH EXTRACTION Bilateral    Social History   Social History Narrative   Not on file   Immunization History  Administered Date(s) Administered   Influenza,inj,Quad PF,6+ Mos 11/14/2017, 11/24/2020   Influenza-Unspecified 11/17/2015, 12/12/2018, 12/03/2019   Moderna Sars-Covid-2 Vaccination 06/05/2019, 07/03/2019, 01/18/2020, 07/04/2020   PNEUMOCOCCAL CONJUGATE-20 11/24/2020   Tdap 05/02/2015   Zoster Recombinat (Shingrix) 05/09/2017, 07/12/2017     Objective: Vital Signs: BP 125/74 (BP Location: Left Arm, Patient Position: Sitting, Cuff Size: Normal)   Pulse (!) 54   Ht '5\' 4"'$  (1.626 m)   Wt 163 lb 3.2 oz (74 kg)   LMP 09/17/2014   BMI 28.01 kg/m    Physical Exam Vitals and nursing note reviewed.  Constitutional:      Appearance: She is well-developed.  HENT:     Head: Normocephalic and atraumatic.  Eyes:     Conjunctiva/sclera: Conjunctivae normal.  Cardiovascular:     Rate and Rhythm: Normal rate and regular rhythm.     Heart sounds: Normal heart sounds.  Pulmonary:     Effort: Pulmonary effort is normal.     Breath sounds: Normal breath sounds.  Abdominal:     General: Bowel sounds are normal.     Palpations: Abdomen is soft.  Musculoskeletal:     Cervical back: Normal range of motion.  Lymphadenopathy:     Cervical: No cervical adenopathy.  Skin:    General: Skin is  warm and dry.     Capillary Refill: Capillary refill takes less than 2 seconds.     Findings: Rash present.     Comments: Dry scales were noted over right wrist and palm.  Neurological:     Mental Status: She is alert and oriented to person, place, and time.  Psychiatric:        Behavior: Behavior normal.      Musculoskeletal Exam: Cervical, thoracic and lumbar spine were in good range of motion.  She had some tenderness over right SI joint.  Shoulder joints, elbow joints, wrist joints, MCPs PIPs and DIPs with good range of motion with no synovitis.  Hip joints and knee joints with good range of motion.  There was no evidence of Achilles tendinitis or planter fasciitis.  She had no tenderness over ankles or MTPs.  CDAI Exam: CDAI Score: -- Patient Global: --; Provider Global: -- Swollen: --; Tender: -- Joint Exam  09/04/2021   No joint exam has been documented for this visit   There is currently no information documented on the homunculus. Go to the Rheumatology activity and complete the homunculus joint exam.  Investigation: No additional findings.  Imaging: MM 3D SCREEN BREAST BILATERAL  Result Date: 08/31/2021 CLINICAL DATA:  Screening. EXAM: DIGITAL SCREENING BILATERAL MAMMOGRAM WITH TOMOSYNTHESIS AND CAD TECHNIQUE: Bilateral screening digital craniocaudal and mediolateral oblique mammograms were obtained. Bilateral screening digital breast tomosynthesis was performed. The images were evaluated with computer-aided detection. COMPARISON:  Previous exam(s). ACR Breast Density Category b: There are scattered areas of fibroglandular density. FINDINGS: There are no findings suspicious for malignancy. IMPRESSION: No mammographic evidence of malignancy. A result letter of this screening mammogram will be mailed directly to the patient. RECOMMENDATION: Screening mammogram in one year. (Code:SM-B-01Y) BI-RADS CATEGORY  1: Negative. Electronically Signed   By: Zerita Boers M.D.   On:  08/31/2021 16:14    Recent Labs: Lab Results  Component Value Date   WBC 6.2 06/22/2021   HGB 14.3 06/22/2021   PLT 257 06/22/2021   NA 142 06/22/2021   K 4.7 06/22/2021   CL 106 06/22/2021   CO2 27 06/22/2021   GLUCOSE 115 (H) 06/22/2021   BUN 15 06/22/2021   CREATININE 0.94 06/22/2021   BILITOT 0.3 06/22/2021   ALKPHOS 81 11/24/2020   AST 14 06/22/2021   ALT 16 06/22/2021   PROT 6.5 06/22/2021   ALBUMIN 4.3 11/24/2020   CALCIUM 9.2 06/22/2021   GFRAA 115 06/16/2020   QFTBGOLDPLUS NEGATIVE 03/24/2021    Speciality Comments: MTX tabs 04/14/20-07/26/20 (stopped d/t nausea from folic acid); switched to Tremfya 08/04/20  Procedures:  No procedures performed Allergies: Molnupiravir and Gold-containing drug products   Assessment / Plan:     Visit Diagnoses: Psoriatic arthritis (HCC)-she has been doing well on Taltz 80 mg subcu every 4 weeks.  She has not noticed any side effects.  She denies any joint pain or joint swelling.  She has some stiffness and discomfort in the SI joints.  There is no history of uveitis, Achilles tendinitis or planter fasciitis.    Psoriasis-she states her skin has been improving gradually.  She has some dry skin was noted on the right wrist and palm.  Use of topical moisturizing agents were discussed.  High risk medication use - Taltz-initiated on 02/24/2021.  She is on Taltz 80 mg subcu every 4 weeks which she has been tolerating well.   Labs obtained on Jun 22, 2021 CBC with differential and CMP with GFR were within normal limits.  TB gold was negative on March 24, 2021.  She has been advised to get labs every 3 months to monitor for drug toxicity.  Information on immunization was placed in the AVS.  She was also advised to hold Taltz if she develops an infection and resume after the infection resolves.  Primary osteoarthritis of both hands-she denies any discomfort today.  Chronic right SI joint pain - X-ray was unremarkable.  She continues to have some  SI joint discomfort.  Stretching exercises were demonstrated in the office.  Plantar fasciitis, left -she denies any recent episodes  of Planter fasciitis.  She had problems with recurrent Planter fasciitis in the last 3 years which was treated with cortisone injections.    Trochanteric bursitis of both hips-she complains of intermittent discomfort.  She had no tenderness on my examination today.  Primary osteoarthritis of both feet -she denies any discomfort in her feet.  There was  no evidence of Planter fasciitis or Achilles tendinitis.  Osteoarthritis was noted on the x-rays.  History of right ankle joint fracture in the past.   Arthropathy of lumbar facet joint -core strengthening exercise or emphasized.  History of lower back painx 10 years.  History of right-sided sciatica.  X-ray showed mild facet joint arthropathy.   Vitamin D deficiency-vitamin D was normal on May 25, 2021.  She was encouraged to take vitamin D on a regular basis.  Other medical problems are listed as follows:  Prediabetes  History of hyperlipidemia  History of gastroesophageal reflux (GERD)  Lobular carcinoma in situ (LCIS) of left breast  Hx of migraines  History of hypothyroidism  Orders: No orders of the defined types were placed in this encounter.  No orders of the defined types were placed in this encounter.    Follow-Up Instructions: Return in about 5 months (around 02/04/2022) for Psoriatic arthritis.   Bo Merino, MD  Note - This record has been created using Editor, commissioning.  Chart creation errors have been sought, but may not always  have been located. Such creation errors do not reflect on  the standard of medical care.

## 2021-08-24 ENCOUNTER — Other Ambulatory Visit: Payer: Self-pay | Admitting: Family Medicine

## 2021-08-24 DIAGNOSIS — Z1231 Encounter for screening mammogram for malignant neoplasm of breast: Secondary | ICD-10-CM

## 2021-08-27 ENCOUNTER — Other Ambulatory Visit: Payer: Self-pay | Admitting: Rheumatology

## 2021-08-27 DIAGNOSIS — L4050 Arthropathic psoriasis, unspecified: Secondary | ICD-10-CM

## 2021-08-27 DIAGNOSIS — Z79899 Other long term (current) drug therapy: Secondary | ICD-10-CM

## 2021-08-27 DIAGNOSIS — L409 Psoriasis, unspecified: Secondary | ICD-10-CM

## 2021-08-27 NOTE — Telephone Encounter (Signed)
Next Visit: 09/04/2021   Last Visit: 05/26/2021   Last Fill: 05/14/2021  DX: Psoriatic arthritis    Current Dose per office note on 05/26/2021: taltz 80 mg sq injections every 4 weeks  Labs: 06/22/2021 Glucose is 115.  Rest CMP WNL.  CBC WNL.   Tb Gold: 03/24/2021 Neg   Okay to refill Taltz?

## 2021-08-31 ENCOUNTER — Ambulatory Visit
Admission: RE | Admit: 2021-08-31 | Discharge: 2021-08-31 | Disposition: A | Discharge status: Home / Self Care | Payer: Commercial Managed Care - PPO | Source: Ambulatory Visit | Attending: Family Medicine | Admitting: Family Medicine

## 2021-08-31 DIAGNOSIS — Z1231 Encounter for screening mammogram for malignant neoplasm of breast: Secondary | ICD-10-CM

## 2021-09-04 ENCOUNTER — Ambulatory Visit: Payer: Commercial Managed Care - PPO | Admitting: Rheumatology

## 2021-09-04 ENCOUNTER — Encounter: Payer: Self-pay | Admitting: Rheumatology

## 2021-09-04 VITALS — BP 125/74 | HR 54 | Ht 64.0 in | Wt 163.2 lb

## 2021-09-04 DIAGNOSIS — M19071 Primary osteoarthritis, right ankle and foot: Secondary | ICD-10-CM

## 2021-09-04 DIAGNOSIS — D0502 Lobular carcinoma in situ of left breast: Secondary | ICD-10-CM

## 2021-09-04 DIAGNOSIS — Z79899 Other long term (current) drug therapy: Secondary | ICD-10-CM | POA: Diagnosis not present

## 2021-09-04 DIAGNOSIS — R7303 Prediabetes: Secondary | ICD-10-CM

## 2021-09-04 DIAGNOSIS — M19041 Primary osteoarthritis, right hand: Secondary | ICD-10-CM

## 2021-09-04 DIAGNOSIS — G8929 Other chronic pain: Secondary | ICD-10-CM

## 2021-09-04 DIAGNOSIS — L4050 Arthropathic psoriasis, unspecified: Secondary | ICD-10-CM | POA: Diagnosis not present

## 2021-09-04 DIAGNOSIS — M722 Plantar fascial fibromatosis: Secondary | ICD-10-CM

## 2021-09-04 DIAGNOSIS — E559 Vitamin D deficiency, unspecified: Secondary | ICD-10-CM

## 2021-09-04 DIAGNOSIS — M7062 Trochanteric bursitis, left hip: Secondary | ICD-10-CM

## 2021-09-04 DIAGNOSIS — L409 Psoriasis, unspecified: Secondary | ICD-10-CM | POA: Diagnosis not present

## 2021-09-04 DIAGNOSIS — M19072 Primary osteoarthritis, left ankle and foot: Secondary | ICD-10-CM

## 2021-09-04 DIAGNOSIS — M19042 Primary osteoarthritis, left hand: Secondary | ICD-10-CM

## 2021-09-04 DIAGNOSIS — M47816 Spondylosis without myelopathy or radiculopathy, lumbar region: Secondary | ICD-10-CM

## 2021-09-04 DIAGNOSIS — Z8719 Personal history of other diseases of the digestive system: Secondary | ICD-10-CM

## 2021-09-04 DIAGNOSIS — M533 Sacrococcygeal disorders, not elsewhere classified: Secondary | ICD-10-CM

## 2021-09-04 DIAGNOSIS — Z8639 Personal history of other endocrine, nutritional and metabolic disease: Secondary | ICD-10-CM

## 2021-09-04 DIAGNOSIS — Z8669 Personal history of other diseases of the nervous system and sense organs: Secondary | ICD-10-CM

## 2021-09-04 DIAGNOSIS — M7061 Trochanteric bursitis, right hip: Secondary | ICD-10-CM

## 2021-09-04 NOTE — Patient Instructions (Signed)
Standing Labs We placed an order today for your standing lab work.   Please have your standing labs drawn in August  and every 3 months  If possible, please have your labs drawn 2 weeks prior to your appointment so that the provider can discuss your results at your appointment.  Please note that you may see your imaging and lab results in MyChart before we have reviewed them. We may be awaiting multiple results to interpret others before contacting you. Please allow our office up to 72 hours to thoroughly review all of the results before contacting the office for clarification of your results.  We have open lab daily: Monday through Thursday from 1:30-4:30 PM and Friday from 1:30-4:00 PM at the office of Dr. Deante Blough, [facility].   Please be advised, all patients with office appointments requiring lab work will take precedent over walk-in lab work.  If possible, please come for your lab work on Monday and Friday afternoons, as you may experience shorter wait times. The office is located at [address], [address], [place], [place] [address] No appointment is necessary.   Labs are drawn by [facility]. Please bring your co-pay at the time of your lab draw.  You may receive a bill from [facility] for your lab work.  Please note if you are on Hydroxychloroquine and and an order has been placed for a Hydroxychloroquine level, you will need to have it drawn 4 hours or more after your last dose.  If you wish to have your labs drawn at another location, please call the office 24 hours in advance to send orders.  If you have any questions regarding directions or hours of operation,  please call [phone].   As a reminder, please drink plenty of water prior to coming for your lab work. Thanks!   Vaccines You are taking a medication(s) that can suppress your immune system.  The following immunizations are recommended: Flu annually Covid-19  Td/Tdap (tetanus, diphtheria,  pertussis) every 10 years Pneumonia (Prevnar 15 then Pneumovax 23 at least 1 year apart.  Alternatively, can take Prevnar 20 without needing additional dose) Shingrix: 2 doses from 4 weeks to 6 months apart  Please check with your PCP to make sure you are up to date.   If you have signs or symptoms of an infection or start antibiotics: First, call your PCP for workup of your infection. Hold your medication through the infection, until you complete your antibiotics, and until symptoms resolve if you take the following: Injectable medication (Actemra, Benlysta, Cimzia, Cosentyx, Enbrel, Humira, Kevzara, Orencia, Remicade, Simponi, Stelara, Taltz, Tremfya) Methotrexate Leflunomide (Arava) Mycophenolate (Cellcept) Xeljanz, Olumiant, or Rinvoq  

## 2021-09-16 ENCOUNTER — Other Ambulatory Visit: Payer: Self-pay

## 2021-09-16 DIAGNOSIS — R1310 Dysphagia, unspecified: Secondary | ICD-10-CM

## 2021-09-17 ENCOUNTER — Ambulatory Visit
Admission: RE | Admit: 2021-09-17 | Discharge: 2021-09-17 | Disposition: A | Discharge status: Home / Self Care | Payer: Commercial Managed Care - PPO | Attending: Gastroenterology | Admitting: Gastroenterology

## 2021-09-17 ENCOUNTER — Ambulatory Visit: Payer: Commercial Managed Care - PPO | Admitting: Certified Registered"

## 2021-09-17 ENCOUNTER — Encounter
Admission: RE | Disposition: A | Discharge status: Home / Self Care | Payer: Self-pay | Source: Home / Self Care | Attending: Gastroenterology

## 2021-09-17 DIAGNOSIS — R1310 Dysphagia, unspecified: Secondary | ICD-10-CM

## 2021-09-17 DIAGNOSIS — K2289 Other specified disease of esophagus: Secondary | ICD-10-CM | POA: Insufficient documentation

## 2021-09-17 DIAGNOSIS — K449 Diaphragmatic hernia without obstruction or gangrene: Secondary | ICD-10-CM | POA: Insufficient documentation

## 2021-09-17 HISTORY — PX: ESOPHAGOGASTRODUODENOSCOPY (EGD) WITH PROPOFOL: SHX5813

## 2021-09-17 SURGERY — ESOPHAGOGASTRODUODENOSCOPY (EGD) WITH PROPOFOL
Anesthesia: General

## 2021-09-17 MED ORDER — SODIUM CHLORIDE 0.9 % IV SOLN: INTRAVENOUS | Status: DC

## 2021-09-17 MED ORDER — PROPOFOL 10 MG/ML IV BOLUS
INTRAVENOUS | Status: DC | PRN
  Administered 2021-09-17: 30 mg via INTRAVENOUS
  Administered 2021-09-17: 70 mg via INTRAVENOUS

## 2021-09-17 MED ORDER — GLYCOPYRROLATE 0.2 MG/ML IJ SOLN
INTRAMUSCULAR | Status: DC | PRN
  Administered 2021-09-17: .2 mg via INTRAVENOUS

## 2021-09-17 MED ORDER — PROPOFOL 500 MG/50ML IV EMUL
INTRAVENOUS | Status: DC | PRN
  Administered 2021-09-17: 120 ug/kg/min via INTRAVENOUS

## 2021-09-17 MED ORDER — MIDAZOLAM HCL 5 MG/5ML IJ SOLN
INTRAMUSCULAR | Status: DC | PRN
  Administered 2021-09-17: 2 mg via INTRAVENOUS

## 2021-09-17 MED ORDER — LIDOCAINE 2% (20 MG/ML) 5 ML SYRINGE
INTRAMUSCULAR | Status: DC | PRN
  Administered 2021-09-17: 20 mg via INTRAVENOUS

## 2021-09-17 MED ORDER — MIDAZOLAM HCL 2 MG/2ML IJ SOLN
INTRAMUSCULAR | Status: AC
  Filled 2021-09-17: qty 2

## 2021-09-17 NOTE — Transfer of Care (Signed)
Immediate Anesthesia Transfer of Care Note  Patient: Erica Woods  Procedure(s) Performed: ESOPHAGOGASTRODUODENOSCOPY (EGD) WITH PROPOFOL  Patient Location: Endoscopy Unit  Anesthesia Type:General  Level of Consciousness: drowsy  Airway & Oxygen Therapy: Patient Spontanous Breathing  Post-op Assessment: Report given to RN and Post -op Vital signs reviewed and stable  Post vital signs: Reviewed  Last Vitals:  Vitals Value Taken Time  BP    Temp    Pulse 83 09/17/21 0943  Resp 17 09/17/21 0943  SpO2 97 % 09/17/21 0943  Vitals shown include unvalidated device data.  Last Pain:  Vitals:   09/17/21 0910  TempSrc: Temporal         Complications: No notable events documented.

## 2021-09-17 NOTE — Anesthesia Postprocedure Evaluation (Signed)
Anesthesia Post Note  Patient: Erica Woods  Procedure(s) Performed: ESOPHAGOGASTRODUODENOSCOPY (EGD) WITH PROPOFOL  Patient location during evaluation: Endoscopy Anesthesia Type: General Level of consciousness: awake and alert Pain management: pain level controlled Vital Signs Assessment: post-procedure vital signs reviewed and stable Respiratory status: spontaneous breathing, nonlabored ventilation, respiratory function stable and patient connected to nasal cannula oxygen Cardiovascular status: blood pressure returned to baseline and stable Postop Assessment: no apparent nausea or vomiting Anesthetic complications: no   No notable events documented.   Last Vitals:  Vitals:   09/17/21 0953 09/17/21 1003  BP: 107/68 109/83  Pulse: 76 75  Resp: 15 20  Temp:    SpO2: 98% 98%    Last Pain:  Vitals:   09/17/21 1003  TempSrc:   PainSc: 0-No pain                 Dimas Millin

## 2021-09-17 NOTE — Anesthesia Preprocedure Evaluation (Signed)
Anesthesia Evaluation  Patient identified by MRN, date of birth, ID band Patient awake    Reviewed: Allergy & Precautions, NPO status , Patient's Chart, lab work & pertinent test results  Airway Mallampati: III  TM Distance: >3 FB Neck ROM: full    Dental  (+) Chipped, Poor Dentition   Pulmonary neg pulmonary ROS, neg shortness of breath, neg sleep apnea, neg recent URI,    Pulmonary exam normal        Cardiovascular (-) CAD, (-) Past MI and (-) CABG negative cardio ROS Normal cardiovascular exam     Neuro/Psych negative neurological ROS  negative psych ROS   GI/Hepatic Neg liver ROS, GERD  ,  Endo/Other  Hypothyroidism   Renal/GU negative Renal ROS  negative genitourinary   Musculoskeletal   Abdominal   Peds  Hematology negative hematology ROS (+)   Anesthesia Other Findings Past Medical History: No date: Allergic rhinitis No date: Breast cancer ([place]) No date: Chickenpox No date: GERD (gastroesophageal reflux disease) No date: Headache No date: Hemorrhoids No date: History of blood transfusion No date: Hypercholesteremia No date: Hyperthyroidism  Past Surgical History: No date: ABDOMINAL SURGERY No date: APPENDECTOMY 02/04/2017: BALLOON DILATION; N/A     Comment:  Procedure: BALLOON DILATION;  Surgeon: [name], MD;               Location: [facility] ENDOSCOPY;  Service: Gastroenterology;                Laterality: N/A; 10/02/2014: BREAST BIOPSY; Left     Comment:  Procedure: LEFT BREAST BIOPSY AFTER NEEDLE LOCALIZATION               X TWO;  Surgeon: [name], MD;  Location: [facility] ORS;                Service: General;  Laterality: Left; 2016: BREAST LUMPECTOMY; Left     Comment:  benign 10/23/2014: COLONOSCOPY; N/A     Comment:  Procedure: COLONOSCOPY;  Surgeon: Rogene Houston, MD;                Location: [facility] ENDO SU[facility];  Service: Endoscopy;                Laterality: N/A;  730 02/04/2017:  ESOPHAGOGASTRODUODENOSCOPY (EGD) WITH PROPOFOL; N/A     Comment:  Procedure: ESOPHAGOGASTRODUODENOSCOPY (EGD) WITH               PROPOFOL;  Surgeon: [name], MD;  Location: [facility]               ENDOSCOPY;  Service: Gastroenterology;  Laterality: N/A; 03/02/2012: LAPAROSCOPIC APPENDECTOMY     Comment:  Procedure: APPENDECTOMY LAPAROSCOPIC;  Surgeon: [name], MD;  Location: [facility] ORS;  Service: General;                Laterality: N/A; No date: OVARIAN CYST REMOVAL; Left No date: TUBAL LIGATION No date: WISDOM TOOTH EXTRACTION; Bilateral  BMI    Body Mass Index: 27.98 kg/m      Reproductive/Obstetrics negative OB ROS                             Anesthesia Physical Anesthesia Plan  ASA: 2  Anesthesia Plan: General   Post-op Pain Management: Minimal or no pain anticipated   Induction: Intravenous  PONV Risk Score and Plan: 3 and  Propofol infusion, TIVA and Ondansetron  Airway Management Planned: Nasal Cannula  Additional Equipment: None  Intra-op Plan:   Post-operative Plan:   Informed Consent: I have reviewed the patients History and Physical, chart, labs and discussed the procedure including the risks, benefits and alternatives for the proposed anesthesia with the patient or authorized representative who has indicated his/her understanding and acceptance.     Dental advisory given  Plan Discussed with: CRNA and Surgeon  Anesthesia Plan Comments: (Discussed risks of anesthesia with patient, including possibility of difficulty with spontaneous ventilation under anesthesia necessitating airway intervention, PONV, and rare risks such as cardiac or respiratory or neurological events, and allergic reactions. Discussed the role of CRNA in patient's perioperative care. Patient understands.)        Anesthesia Quick Evaluation

## 2021-09-17 NOTE — Op Note (Signed)
[facility] Gastroenterology Patient Name: Erica Woods Procedure Date: 09/17/2021 9:28 AM MRN: [id number] Account #: [id number] Date of Birth: Mar 24, 1965 Admit Type: Outpatient Age: 56 Room: [facility] ENDO ROOM 1 Gender: Female Note Status: Finalized Instrument Name: Altamese Cabal Endoscope [id number] Procedure:             Upper GI endoscopy Indications:           Dysphagia Providers:             [name] MD, MD Referring MD:          Angela Adam. Caryl Bis (Referring MD) Medicines:             Monitored Anesthesia Care Complications:         No immediate complications. Procedure:             Pre-Anesthesia Assessment:                        - Prior to the procedure, a History and Physical was                         performed, and patient medications, allergies and                         sensitivities were reviewed. The patient's tolerance                         of previous anesthesia was reviewed.                        - The risks and benefits of the procedure and the                         sedation options and risks were discussed with the                         patient. All questions were answered and informed                         consent was obtained.                        - ASA Grade Assessment: II - A patient with mild                         systemic disease.                        After obtaining informed consent, the endoscope was                         passed under direct vision. Throughout the procedure,                         the patient's blood pressure, pulse, and oxygen                         saturations were monitored continuously. The Endoscope                         was introduced through  the mouth, and advanced to the                         third part of duodenum. The upper GI endoscopy was                         accomplished with ease. The patient tolerated the                         procedure well. Findings:      The examined duodenum was  normal.      The stomach was normal.      A medium-sized hiatal hernia was present.      Mucosal changes including ringed esophagus were found in the entire       esophagus. Esophageal findings were graded using the Eosinophilic       Esophagitis Endoscopic Reference Score (EoE-EREFS) as: Edema Grade 0       Normal (distinct vascular markings), Rings Grade 1 Mild (subtle       circumferential ridges seen on esophageal distension), Exudates Grade 0       None (no white lesions seen), Furrows Grade 0 None (no vertical lines       seen) and Stricture none (no stricture found). Biopsies were taken with       a cold forceps for histology. Impression:            - Normal examined duodenum.                        - Normal stomach.                        - Medium-sized hiatal hernia.                        - Esophageal mucosal changes suggestive of                         eosinophilic esophagitis. Biopsied. Recommendation:        - Await pathology results.                        - Discharge patient to home (with escort).                        - Resume previous diet.                        - Continue present medications.                        - Return to my office in 3 weeks. Procedure Code(s):     --- Professional ---                        [phone], Esophagogastroduodenoscopy, flexible,                         transoral; with biopsy, single or multiple Diagnosis Code(s):     --- Professional ---                        K44.9, Diaphragmatic hernia  without obstruction or                         gangrene                        K22.8, Other specified diseases of esophagus                        R13.10, Dysphagia, unspecified CPT copyright 2019 American Medical Association. All rights reserved. The codes documented in this report are preliminary and upon coder review may  be revised to meet current compliance requirements. [name], MD [name] MD, MD 09/17/2021 9:43:05 AM This report has been  signed electronically. Number of Addenda: 0 Note Initiated On: 09/17/2021 9:28 AM Estimated Blood Loss:  Estimated blood loss: none.      [facility]

## 2021-09-17 NOTE — H&P (Signed)
[name], MD [address], [place], [place], [place], [address] [address], [place], [place], [place], [address] Phone: [phone]  Fax: [phone]  Primary Care Physician:  Leone Haven, MD   Pre-Procedure History & Physical: HPI:  Erica Woods is a 56 y.o. female is here for an endoscopy    Past Medical History:  Diagnosis Date   Allergic rhinitis    Breast cancer ([place])    Chickenpox    GERD (gastroesophageal reflux disease)    Headache    Hemorrhoids    History of blood transfusion    Hypercholesteremia    Hyperthyroidism     Past Surgical History:  Procedure Laterality Date   ABDOMINAL SURGERY     APPENDECTOMY     BALLOON DILATION N/A 02/04/2017   Procedure: [place];  Surgeon: [name], MD;  Location: [facility] ENDOSCOPY;  Service: Gastroenterology;  Laterality: N/A;   BREAST BIOPSY Left 10/02/2014   Procedure: LEFT BREAST BIOPSY AFTER NEEDLE LOCALIZATION X TWO;  Surgeon: [name], MD;  Location: [facility] ORS;  Service: General;  Laterality: Left;   BREAST LUMPECTOMY Left 2016   benign   COLONOSCOPY N/A 10/23/2014   Procedure: COLONOSCOPY;  Surgeon: Rogene Houston, MD;  Location: [facility] ENDO [facility];  Service: Endoscopy;  Laterality: N/A;  730   ESOPHAGOGASTRODUODENOSCOPY (EGD) WITH PROPOFOL N/A 02/04/2017   Procedure: ESOPHAGOGASTRODUODENOSCOPY (EGD) WITH PROPOFOL;  Surgeon: [name], MD;  Location: [facility] ENDOSCOPY;  Service: Gastroenterology;  Laterality: N/A;   LAPAROSCOPIC APPENDECTOMY  03/02/2012   Procedure: APPENDECTOMY LAPAROSCOPIC;  Surgeon: [name], MD;  Location: [facility] ORS;  Service: General;  Laterality: N/A;   OVARIAN CYST REMOVAL Left    TUBAL LIGATION     WISDOM TOOTH EXTRACTION Bilateral     Prior to Admission medications   Medication Sig Start Date End Date Taking? Authorizing Provider  thyroid (NP THYROID) 30 MG tablet TAKE 1 & 1/2 (ONE & ONE-HALF) TABLETS BY MOUTH ONCE DAILY BEFORE BREAKFAST 05/15/21  Yes  Leone Haven, MD  aspirin-acetaminophen-caffeine (EXCEDRIN MIGRAINE) [phone] MG tablet Take 1 tablet by mouth every 6 (six) hours as needed for headache. 11/25/14   Magrinat, Virgie Dad, MD  Cyanocobalamin (VITAMIN B-12 PO) Take by mouth daily. Patient not taking: Reported on 09/04/2021    [provider]  Ixekizumab (TALTZ) 80 MG/ML SOAJ INJECT '80MG'$  SUBCUTANEOUSLY EVERY 4 WEEKS 08/27/21   Ofilia Neas, PA-C  omeprazole (PRILOSEC) 40 MG capsule Take 1 capsule (40 mg total) by mouth daily. 05/25/21   Leone Haven, MD  triamcinolone cream (KENALOG) 0.1 % Apply to affected area twice daily as needed. 07/06/21   Ofilia Neas, PA-C  VITAMIN D PO Take by mouth daily.    [provider]    Allergies as of 09/16/2021 - Review Complete 09/04/2021  Allergen Reaction Noted   Molnupiravir Rash 01/14/2021   Gold-containing drug products  03/14/2017    Family History  Problem Relation Age of Onset   Diabetes Mother    Heart attack Father    Hypertension Other    Diabetes Other    Diabetes Sister    Cancer Sister    Tuberculosis Brother    Irregular heart beat Brother    Parkinson's disease Sister    Healthy Son    Irregular heart beat Son    Healthy Son     Social History   Socioeconomic History   Marital status: Married    Spouse name: Not  on file   Number of children: Not on file   Years of education: Not on file   Highest education level: Not on file  Occupational History   Not on file  Tobacco Use   Smoking status: Never    Passive exposure: Never   Smokeless tobacco: Never  Vaping Use   Vaping Use: Never used  Substance and Sexual Activity   Alcohol use: Not Currently   Drug use: No   Sexual activity: Yes    Partners: Male    Birth control/protection: Post-menopausal    Comment: Husband  Other Topics Concern   Not on file  Social History Narrative   Not on file   Social Determinants of Health   Financial Resource Strain: Not on file   Food Insecurity: Not on file  Transportation Needs: Not on file  Physical Activity: Not on file  Stress: Not on file  Social Connections: Not on file  Intimate Partner Violence: Not on file    Review of Systems: See HPI, otherwise negative ROS  Physical Exam: BP 135/76   Pulse 60   Temp (!) 97.1 F (36.2 C) (Temporal)   Resp 18   Wt 73.9 kg   LMP 09/17/2014   SpO2 100%   BMI 27.98 kg/m  General:   Alert,  pleasant and cooperative in NAD Head:  Normocephalic and atraumatic. Neck:  Supple; no masses or thyromegaly. Lungs:  Clear throughout to auscultation, normal respiratory effort.    Heart:  +S1, +S2, Regular rate and rhythm, No edema. Abdomen:  Soft, nontender and nondistended. Normal bowel sounds, without guarding, and without rebound.   Neurologic:  Alert and  oriented x4;  grossly normal neurologically.  Impression/Plan: Erica Woods is here for an endoscopy  to be performed for  evaluation of dysphagia    Risks, benefits, limitations, and alternatives regarding endoscopy have been reviewed with the patient.  Questions have been answered.  All parties agreeable.   [name], MD  09/17/2021, 9:32 AM

## 2021-09-18 ENCOUNTER — Telehealth: Payer: Self-pay

## 2021-09-18 ENCOUNTER — Encounter: Payer: Self-pay | Admitting: Gastroenterology

## 2021-09-18 LAB — SURGICAL PATHOLOGY

## 2021-09-18 NOTE — Telephone Encounter (Signed)
Called patient back to let her know that her follow up appointment with Dr. Vicente Males is on 10/08/2021 at 2:15 PM and if she needed to reschedule to please call our office to speak with our receptionist at [phone]. Appointment information will also be mailed.

## 2021-09-22 ENCOUNTER — Other Ambulatory Visit: Payer: Self-pay | Admitting: *Deleted

## 2021-09-22 DIAGNOSIS — Z79899 Other long term (current) drug therapy: Secondary | ICD-10-CM

## 2021-09-23 LAB — CBC WITH DIFFERENTIAL/PLATELET
Absolute Monocytes: 557 cells/uL (ref 200–950)
Basophils Absolute: 51 cells/uL (ref 0–200)
Basophils Relative: 0.8 %
Eosinophils Absolute: 179 cells/uL (ref 15–500)
Eosinophils Relative: 2.8 %
HCT: 42.6 % (ref 35.0–45.0)
Hemoglobin: 14.4 g/dL (ref 11.7–15.5)
Lymphs Abs: 2029 cells/uL (ref 850–3900)
MCH: 30.5 pg (ref 27.0–33.0)
MCHC: 33.8 g/dL (ref 32.0–36.0)
MCV: 90.3 fL (ref 80.0–100.0)
MPV: 11.4 fL (ref 7.5–12.5)
Monocytes Relative: 8.7 %
Neutro Abs: 3584 cells/uL (ref 1500–7800)
Neutrophils Relative %: 56 %
Platelets: 261 10*3/uL (ref 140–400)
RBC: 4.72 10*6/uL (ref 3.80–5.10)
RDW: 13.1 % (ref 11.0–15.0)
Total Lymphocyte: 31.7 %
WBC: 6.4 10*3/uL (ref 3.8–10.8)

## 2021-09-23 LAB — COMPLETE METABOLIC PANEL WITH GFR
AG Ratio: 1.7 (calc) (ref 1.0–2.5)
ALT: 17 U/L (ref 6–29)
AST: 14 U/L (ref 10–35)
Albumin: 4.3 g/dL (ref 3.6–5.1)
Alkaline phosphatase (APISO): 84 U/L (ref 37–153)
BUN: 13 mg/dL (ref 7–25)
CO2: 29 mmol/L (ref 20–32)
Calcium: 9.8 mg/dL (ref 8.6–10.4)
Chloride: 104 mmol/L (ref 98–110)
Creat: 0.89 mg/dL (ref 0.50–1.03)
Globulin: 2.5 g/dL (calc) (ref 1.9–3.7)
Glucose, Bld: 104 mg/dL — ABNORMAL HIGH (ref 65–99)
Potassium: 4.8 mmol/L (ref 3.5–5.3)
Sodium: 140 mmol/L (ref 135–146)
Total Bilirubin: 0.3 mg/dL (ref 0.2–1.2)
Total Protein: 6.8 g/dL (ref 6.1–8.1)
eGFR: 76 mL/min/{1.73_m2} (ref >=60)

## 2021-09-23 NOTE — Progress Notes (Signed)
Glucose is 104. Rest of CMP WNL. CBC WNL.

## 2021-10-05 ENCOUNTER — Telehealth: Payer: Self-pay | Admitting: Family Medicine

## 2021-10-05 NOTE — Telephone Encounter (Signed)
Pt came into office to drop off physical form paper that need to be signed by provider. Place in provider folder

## 2021-10-08 ENCOUNTER — Encounter: Payer: Self-pay | Admitting: Gastroenterology

## 2021-10-08 ENCOUNTER — Ambulatory Visit: Payer: Commercial Managed Care - PPO | Admitting: Gastroenterology

## 2021-10-08 VITALS — BP 126/77 | HR 76 | Temp 98.3°F | Wt 163.8 lb

## 2021-10-08 DIAGNOSIS — K200 Eosinophilic esophagitis: Secondary | ICD-10-CM | POA: Diagnosis not present

## 2021-10-08 DIAGNOSIS — R1310 Dysphagia, unspecified: Secondary | ICD-10-CM | POA: Diagnosis not present

## 2021-10-08 MED ORDER — FLUTICASONE PROPIONATE HFA 220 MCG/ACT IN AERO: 2.0000 | INHALATION_SPRAY | Freq: Two times a day (BID) | RESPIRATORY_TRACT | 1 refills | Status: DC

## 2021-10-08 NOTE — Progress Notes (Signed)
[name] MD, MRCP(U.K) [address]  [place]  [place], [place] [address]  Main: [phone]  Fax: [phone]   Gastroenterology Consultation  Referring Provider:     Leone Haven, MD Primary Care Physician:  Leone Haven, MD Primary Gastroenterologist:  Dr. [name]  Reason for Consultation: Eosinophilic esophagitis        HPI:   Erica Woods is a 56 y.o. y/o female here to reestablish care was last seen in our office in December 2019 and she carried a diagnosis of eosinophilic esophagitis based on an endoscopy I performed in 2018.  She also had a mild Schatzki's ring that was dilated.  She is doing well on regular use of omeprazole.  Subsequently called in with dysphagia and upper endoscopy was performed on 09/17/2021 by myself a small hiatal hernia was noted features of eosinophilic esophagitis were noted from the mucosa biopsies showed up to 20 high-power field eosinophils per field.  She is here today to discuss results.  She continues to have difficulty swallowing even small pills.  She has epigastric discomfort when she eats.  Does not recollect taking any fluticasone or budesonide in the past.  She has been on omeprazole 40 mg once a day.  Past Medical History:  Diagnosis Date   Allergic rhinitis    Breast cancer ([place])    Chickenpox    GERD (gastroesophageal reflux disease)    Headache    Hemorrhoids    History of blood transfusion    Hypercholesteremia    Hyperthyroidism     Past Surgical History:  Procedure Laterality Date   ABDOMINAL SURGERY     APPENDECTOMY     BALLOON DILATION N/A 02/04/2017   Procedure: BALLOON DILATION;  Surgeon: [name], MD;  Location: [facility] ENDOSCOPY;  Service: Gastroenterology;  Laterality: N/A;   BREAST BIOPSY Left 10/02/2014   Procedure: LEFT BREAST BIOPSY AFTER NEEDLE LOCALIZATION X TWO;  Surgeon: [name], MD;  Location: [facility] ORS;  Service: General;  Laterality: Left;   BREAST LUMPECTOMY Left 2016    benign   COLONOSCOPY N/A 10/23/2014   Procedure: COLONOSCOPY;  Surgeon: Rogene Houston, MD;  Location: [facility]O [facility];  Service: Endoscopy;  Laterality: N/A;  730   ESOPHAGOGASTRODUODENOSCOPY (EGD) WITH PROPOFOL N/A 02/04/2017   Procedure: ESOPHAGOGASTRODUODENOSCOPY (EGD) WITH PROPOFOL;  Surgeon: [name], MD;  Location: [facility] ENDOSCOPY;  Service: Gastroenterology;  Laterality: N/A;   ESOPHAGOGASTRODUODENOSCOPY (EGD) WITH PROPOFOL N/A 09/17/2021   Procedure: ESOPHAGOGASTRODUODENOSCOPY (EGD) WITH PROPOFOL;  Surgeon: [name], MD;  Location: [facility] ENDOSCOPY;  Service: Endoscopy;  Laterality: N/A;   LAPAROSCOPIC APPENDECTOMY  03/02/2012   Procedure: APPENDECTOMY LAPAROSCOPIC;  Surgeon: [name], MD;  Location: [facility] ORS;  Service: General;  Laterality: N/A;   OVARIAN CYST REMOVAL Left    TUBAL LIGATION     WISDOM TOOTH EXTRACTION Bilateral     Prior to Admission medications   Medication Sig Start Date End Date Taking? Authorizing Provider  aspirin-acetaminophen-caffeine (EXCEDRIN MIGRAINE) [phone] MG tablet Take 1 tablet by mouth every 6 (six) hours as needed for headache. 11/25/14   Magrinat, Virgie Dad, MD  Cyanocobalamin (VITAMIN B-12 PO) Take by mouth daily. Patient not taking: Reported on 09/04/2021    [provider]  Ixekizumab (TALTZ) 80 MG/ML SOAJ INJECT '80MG'$  SUBCUTANEOUSLY EVERY 4 WEEKS 08/27/21   Ofilia Neas, PA-C  omeprazole (PRILOSEC) 40 MG capsule Take 1 capsule (40 mg total) by mouth daily. 05/25/21   Leone Haven, MD  thyroid (NP THYROID) 30 MG tablet TAKE 1 & 1/2 (ONE & ONE-HALF) TABLETS BY MOUTH ONCE DAILY BEFORE BREAKFAST 05/15/21   Leone Haven, MD  triamcinolone cream (KENALOG) 0.1 % Apply to affected area twice daily as needed. 07/06/21   Ofilia Neas, PA-C  VITAMIN D PO Take by mouth daily.    [provider]    Family History  Problem Relation Age of Onset   Diabetes Mother    Heart attack Father    Hypertension Other     Diabetes Other    Diabetes Sister    Cancer Sister    Tuberculosis Brother    Irregular heart beat Brother    Parkinson's disease Sister    Healthy Son    Irregular heart beat Son    Healthy Son      Social History   Tobacco Use   Smoking status: Never    Passive exposure: Never   Smokeless tobacco: Never  Vaping Use   Vaping Use: Never used  Substance Use Topics   Alcohol use: Not Currently   Drug use: No    Allergies as of 10/08/2021 - Review Complete 10/08/2021  Allergen Reaction Noted   Molnupiravir Rash 01/14/2021   Gold-containing drug products  03/14/2017    Review of Systems:    All systems reviewed and negative except where noted in HPI.   Physical Exam:  BP 126/77   Pulse 76   Temp 98.3 F (36.8 C) (Oral)   Wt 163 lb 12.8 oz (74.3 kg)   LMP 09/17/2014   BMI 28.12 kg/m  Patient's last menstrual period was 09/17/2014. Psych:  Alert and cooperative. Normal mood and affect. General:   Alert,  Well-developed, well-nourished, pleasant and cooperative in NAD Head:  Normocephalic and atraumatic. Eyes:  Sclera clear, no icterus.   Conjunctiva pink.    Neurologic:  Alert and oriented x3;  grossly normal neurologically. Psych:  Alert and cooperative. Normal mood and affect.  Imaging Studies: No results found.  Assessment and Plan:   Erica Woods is a 56 y.o. y/o female here to see me today for dysphagia.  EGD and biopsies confirm eosinophilic esophagitis.  Has been on a PPI which has not helped symptomatically discussed options of steroid inhaler and down the road [place].  Plan 1.  Discussed about commencing on fluticasone inhaler, demonstrated technique of inhalation.  We will treat for 8 weeks.  If still symptomatic after that will require Dupixent  2.  Food allergy testing  Follow up in 3 to 4 months  Dr [name] MD,MRCP(U.K)

## 2021-10-08 NOTE — Addendum Note (Signed)
Addended by: Wayna Chalet on: 10/08/2021 02:08 PM   Modules accepted: Orders

## 2021-10-10 LAB — FOOD ALLERGY PROFILE
Allergen Corn, IgE: <0.1 kU/L
Clam IgE: <0.1 kU/L
Codfish IgE: <0.1 kU/L
Egg White IgE: <0.1 kU/L
Milk IgE: <0.1 kU/L
Peanut IgE: <0.1 kU/L
Scallop IgE: <0.1 kU/L
Sesame Seed IgE: <0.1 kU/L
Shrimp IgE: <0.1 kU/L
Soybean IgE: <0.1 kU/L
Walnut IgE: <0.1 kU/L
Wheat IgE: <0.1 kU/L

## 2021-10-10 NOTE — Progress Notes (Signed)
normal

## 2021-11-06 ENCOUNTER — Ambulatory Visit (INDEPENDENT_AMBULATORY_CARE_PROVIDER_SITE_OTHER): Payer: Commercial Managed Care - PPO

## 2021-11-06 ENCOUNTER — Ambulatory Visit: Payer: Commercial Managed Care - PPO | Admitting: Podiatry

## 2021-11-06 DIAGNOSIS — M7751 Other enthesopathy of right foot: Secondary | ICD-10-CM | POA: Diagnosis not present

## 2021-11-06 DIAGNOSIS — M722 Plantar fascial fibromatosis: Secondary | ICD-10-CM | POA: Diagnosis not present

## 2021-11-06 MED ORDER — BETAMETHASONE SOD PHOS & ACET 6 (3-3) MG/ML IJ SUSP
3.0000 mg | Freq: Once | INTRAMUSCULAR | Status: AC
  Administered 2021-11-06: 3 mg via INTRA_ARTICULAR

## 2021-11-06 MED ORDER — MELOXICAM 15 MG PO TABS: 15.0000 mg | ORAL_TABLET | Freq: Every day | ORAL | 1 refills | Status: AC

## 2021-11-06 MED ORDER — METHYLPREDNISOLONE 4 MG PO TBPK: ORAL_TABLET | ORAL | 0 refills | Status: DC

## 2021-11-06 NOTE — Progress Notes (Signed)
Chief Complaint  Patient presents with   Foot Pain    Patient is here for foot pain, ankle pain 1 year heel pain 2 -3 weeks ago.     Subjective: 56 y.o. female presenting today as a reestablish new patient for evaluation of pain and tenderness to the right ankle and right plantar heel.  Patient does have a history of plantar fasciitis to the left heel.  She was last seen here in the office 2018.  Patient states that she has noticed pain and tenderness to the right ankle and heel for about 1 year now.  She has not done anything recently for treatment.   Past Medical History:  Diagnosis Date   Allergic rhinitis    Breast cancer ([place])    Chickenpox    GERD (gastroesophageal reflux disease)    Headache    Hemorrhoids    History of blood transfusion    Hypercholesteremia    Hyperthyroidism    Past Surgical History:  Procedure Laterality Date   ABDOMINAL SURGERY     APPENDECTOMY     BALLOON DILATION N/A 02/04/2017   Procedure: BALLOON DILATION;  Surgeon: [name], MD;  Location: [facility] ENDOSCOPY;  Service: Gastroenterology;  Laterality: N/A;   BREAST BIOPSY Left 10/02/2014   Procedure: LEFT BREAST BIOPSY AFTER NEEDLE LOCALIZATION X TWO;  Surgeon: [name], MD;  Location: [facility] ORS;  Service: General;  Laterality: Left;   BREAST LUMPECTOMY Left 2016   benign   COLONOSCOPY N/A 10/23/2014   Procedure: COLONOSCOPY;  Surgeon: Rogene Houston, MD;  [facility];  Service: Endoscopy;  Laterality: N/A;  730   ESOPHAGOGASTRODUODENOSCOPY (EGD) WITH PROPOFOL N/A 02/04/2017   Procedure: ESOPHAGOGASTRODUODENOSCOPY (EGD) WITH PROPOFOL;  Surgeon: [name], MD;  Location: [facility] ENDOSCOPY;  Service: Gastroenterology;  Laterality: N/A;   ESOPHAGOGASTRODUODENOSCOPY (EGD) WITH PROPOFOL N/A 09/17/2021   Procedure: ESOPHAGOGASTRODUODENOSCOPY (EGD) WITH PROPOFOL;  Surgeon: [name], MD;  Location: [facility] ENDOSCOPY;  Service: Endoscopy;  Laterality: N/A;   LAPAROSCOPIC APPENDECTOMY   03/02/2012   Procedure: APPENDECTOMY LAPAROSCOPIC;  Surgeon: [name], MD;  Location: [facility] ORS;  Service: General;  Laterality: N/A;   OVARIAN CYST REMOVAL Left    TUBAL LIGATION     WISDOM TOOTH EXTRACTION Bilateral    Allergies  Allergen Reactions   Molnupiravir Rash   Gold-Containing Drug Products     Allergic to GOLD      Objective: Physical Exam General: The patient is alert and oriented x3 in no acute distress.  Dermatology: Skin is warm, dry and supple bilateral lower extremities. Negative for open lesions or macerations bilateral.   Vascular: Dorsalis Pedis and Posterior Tibial pulses palpable bilateral.  Capillary fill time is immediate to all digits.  Neurological: Epicritic and protective threshold intact bilateral.   Musculoskeletal: Tenderness to palpation to the plantar aspect of the right heel along the plantar fascia as well as the anterior lateral aspect of the right ankle. All other joints range of motion within normal limits bilateral. Strength 5/5 in all groups bilateral.   Radiographic exam: Normal osseous mineralization.  There is some degenerative changes noted throughout the ankle joint right foot.  Plantar and posterior heel spur noted on lateral view.  No acute fracture.   Assessment: 1. Plantar fasciitis right 2.  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 Patient evaluated. Xrays reviewed.   2. Injection of 0.5cc Celestone soluspan injected into the right plantar fascia and anterolateral aspect of the  right ankle 3. Rx for Medrol Dose Pack placed 4. Rx for Meloxicam ordered for patient. 5. Plantar fascial band(s) dispensed 6. Instructed patient regarding therapies and modalities at home to alleviate symptoms.  Advised against going barefoot.  Recommend good supportive shoes and sneakers 7. Return to clinic in 4 weeks.    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 Edrick Kins, DPM    [address],  [address]                Office [phone]  Fax [phone]

## 2021-11-20 ENCOUNTER — Other Ambulatory Visit: Payer: Self-pay | Admitting: Physician Assistant

## 2021-11-20 DIAGNOSIS — L4050 Arthropathic psoriasis, unspecified: Secondary | ICD-10-CM

## 2021-11-20 DIAGNOSIS — Z79899 Other long term (current) drug therapy: Secondary | ICD-10-CM

## 2021-11-20 DIAGNOSIS — L409 Psoriasis, unspecified: Secondary | ICD-10-CM

## 2021-11-20 NOTE — Telephone Encounter (Signed)
Next Visit: 02/04/2022  Last Visit: 09/04/2021  Last Fill: 08/27/2021  DX: Psoriatic arthritis   Current Dose per office note 09/04/2021: Taltz 80 mg subcu every 4 weeks   Labs: 09/22/2021 Glucose is 104. Rest of CMP WNL. CBC WNL.    TB Gold: 03/24/2021 Neg    Okay to refill Taltz?

## 2021-11-24 ENCOUNTER — Encounter: Payer: Self-pay | Admitting: Family Medicine

## 2021-11-24 ENCOUNTER — Ambulatory Visit (INDEPENDENT_AMBULATORY_CARE_PROVIDER_SITE_OTHER): Payer: Commercial Managed Care - PPO | Admitting: Family Medicine

## 2021-11-24 VITALS — BP 120/70 | HR 64 | Temp 98.0°F | Ht 64.0 in | Wt 164.0 lb

## 2021-11-24 DIAGNOSIS — E785 Hyperlipidemia, unspecified: Secondary | ICD-10-CM

## 2021-11-24 DIAGNOSIS — E039 Hypothyroidism, unspecified: Secondary | ICD-10-CM

## 2021-11-24 DIAGNOSIS — Z780 Asymptomatic menopausal state: Secondary | ICD-10-CM

## 2021-11-24 DIAGNOSIS — Q740 Other congenital malformations of upper limb(s), including shoulder girdle: Secondary | ICD-10-CM | POA: Insufficient documentation

## 2021-11-24 DIAGNOSIS — Z0000 Encounter for general adult medical examination without abnormal findings: Secondary | ICD-10-CM

## 2021-11-24 DIAGNOSIS — R7303 Prediabetes: Secondary | ICD-10-CM | POA: Diagnosis not present

## 2021-11-24 DIAGNOSIS — E559 Vitamin D deficiency, unspecified: Secondary | ICD-10-CM

## 2021-11-24 LAB — LIPID PANEL
Cholesterol: 305 mg/dL — ABNORMAL HIGH (ref 0–200)
HDL: 71.8 mg/dL (ref >39.00)
LDL Cholesterol: 197 mg/dL — ABNORMAL HIGH (ref 0–99)
NonHDL: 233.25
Total CHOL/HDL Ratio: 4
Triglycerides: 179 mg/dL — ABNORMAL HIGH (ref 0.0–149.0)
VLDL: 35.8 mg/dL (ref 0.0–40.0)

## 2021-11-24 LAB — HEMOGLOBIN A1C: Hgb A1c MFr Bld: 6.7 % — ABNORMAL HIGH (ref 4.6–6.5)

## 2021-11-24 NOTE — Progress Notes (Signed)
Tommi Rumps, MD Phone: [phone]  Erica Woods is a 56 y.o. female who presents today for CPE.  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 11/22/2014   Migraines 11/25/2014   GERD (gastroesophageal reflux disease) 11/25/2014   Routine general medical examination at a health care facility 05/02/2015   History of breast cancer 11/19/2015   Arthralgia of both hands 05/04/2016   Hypothyroidism 11/08/2016   Muscle cramps 11/08/2016   Plantar fasciitis, left 07/05/2016   Psoriasis 07/05/2016   Hot flashes 07/29/2017   Hyperlipidemia 07/29/2017   Stress 11/14/2017   Prediabetes 09/20/2018   Rash 03/26/2020   Postmenopausal bleeding 11/24/2020   Vitamin D deficiency 05/25/2021   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   Resolved Ambulatory Problems    Diagnosis Date Noted   Neoplasm of breast, 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/2014   Sinusitis, acute frontal 01/28/2016   Dysuria 10/08/2016   Pain of right hand 09/20/2018   Decreased energy 03/26/2020   Sore throat 01/05/2021   Past Medical History:  Diagnosis Date   Allergic rhinitis    Breast cancer ([place])    Chickenpox    Headache    Hemorrhoids    History of blood transfusion    Hypercholesteremia    Hyperthyroidism     Family History  Problem Relation Age of Onset   Diabetes Mother    Heart attack Father    Hypertension  Other    Diabetes Other    Diabetes Sister    Cancer Sister    Tuberculosis Brother    Irregular heart beat Brother    Parkinson's disease Sister    Healthy Son    Irregular heart beat Son    Healthy Son     Social History   Socioeconomic History   Marital status: Married    Spouse name: Not on file   Number of children: Not on file   Years of education: Not on file   Highest education level: Not on file  Occupational History   Not on file  Tobacco Use   Smoking status: Never    Passive exposure: Never   Smokeless tobacco: Never  Vaping Use   Vaping Use: Never used  Substance and Sexual Activity   Alcohol use: Not Currently   Drug use: No   Sexual activity: Yes    Partners: Male    Birth control/protection: Post-menopausal    Comment: Husband  Other Topics Concern   Not on file  Social History Narrative   Not on file   Social Determinants of Health   Financial Resource Strain: Not on file  Food Insecurity: Not on file  Transportation Needs: Not on file  Physical Activity: Not on file  Stress: Not on file  Social Connections: Not on file  Intimate Partner Violence: Not on file    ROS  General:  Negative for nexplained weight loss, fever Skin: Negative for new or changing  mole, sore that won't heal HEENT: Negative for trouble hearing, trouble seeing, ringing in ears, mouth sores, hoarseness, change in voice, dysphagia. CV:  Negative for chest pain, dyspnea, edema, palpitations Resp: Negative for cough, dyspnea, hemoptysis GI: Negative for nausea, vomiting, diarrhea, constipation, abdominal pain, melena, hematochezia. GU: Negative for dysuria, incontinence, urinary hesitance, hematuria, vaginal or penile discharge, polyuria, sexual difficulty, lumps in testicle or breasts MSK: Negative for muscle cramps or aches, joint pain or swelling Neuro: Negative for headaches, weakness, numbness, dizziness, passing out/fainting Psych: Negative for depression, anxiety,  memory problems  Objective  Physical Exam Vitals:   11/24/21 0904  BP: 120/70  Pulse: 64  Temp: 98 F (36.7 C)  SpO2: 98%    BP Readings from Last 3 Encounters:  11/24/21 120/70  10/08/21 126/77  09/17/21 109/83   Wt Readings from Last 3 Encounters:  11/24/21 164 lb (74.4 kg)  10/08/21 163 lb 12.8 oz (74.3 kg)  09/17/21 163 lb (73.9 kg)    Physical Exam Constitutional:      General: She is not in acute distress.    Appearance: She is not diaphoretic.  HENT:     Head: Normocephalic and atraumatic.  Cardiovascular:     Rate and Rhythm: Normal rate and regular rhythm.     Heart sounds: Normal heart sounds.  Pulmonary:     Effort: Pulmonary effort is normal.     Breath sounds: Normal breath sounds.  Chest:       Comments: Chaperone present, left breast with postsurgical changes as outlined above, otherwise bilateral breast with no skin changes, nipple inversion, masses, or tenderness, no axillary masses bilaterally Abdominal:     General: Bowel sounds are normal. There is no distension.     Palpations: Abdomen is soft.     Tenderness: There is no abdominal tenderness.  Lymphadenopathy:     Cervical: No cervical adenopathy.  Skin:    General: Skin is warm and dry.  Neurological:     Mental Status: She is alert.  Psychiatric:        Mood and Affect: Mood normal.      Assessment/Plan:   Problem List Items Addressed This Visit     Hyperlipidemia   Relevant Orders   Lipid panel   Hypothyroidism   Relevant Orders   TSH   Prediabetes   Relevant Orders   HgB A1c   Routine general medical examination at a health care facility - Primary    Physical exam completed.  I encouraged adding in exercise and reducing fried foods.  Cancer screening is up-to-date.  She will get flu vaccine at the pharmacy work.  She declines further COVID vaccinations.  Discussed yearly mammogram and breast exam given her history of breast cancer.  Lab work as outlined.      Vitamin  D deficiency   Relevant Orders   Vitamin D (25 hydroxy)   Other Visit Diagnoses     Postmenopausal estrogen deficiency       Relevant Orders   DG Bone Density     She will call her insurance to see if they cover a bone density scan.  She notes her rheumatologist advised that she have this completed.  Return in about 1 year (around 11/25/2022) for cpe.   Tommi Rumps, MD [place]

## 2021-11-24 NOTE — Assessment & Plan Note (Signed)
Physical exam completed.  I encouraged adding in exercise and reducing fried foods.  Cancer screening is up-to-date.  She will get flu vaccine at the pharmacy work.  She declines further COVID vaccinations.  Discussed yearly mammogram and breast exam given her history of breast cancer.  Lab work as outlined.

## 2021-11-24 NOTE — Patient Instructions (Signed)
Nice to see you.  Please work on diet and exercise. You should try to start walking. Please reduce fried food intake as well.

## 2021-11-25 LAB — TSH: TSH: 2.53 u[IU]/mL (ref 0.35–5.50)

## 2021-11-25 LAB — VITAMIN D 25 HYDROXY (VIT D DEFICIENCY, FRACTURES): VITD: 38.84 ng/mL (ref 30.00–100.00)

## 2021-11-26 ENCOUNTER — Other Ambulatory Visit: Payer: Self-pay

## 2021-11-26 DIAGNOSIS — E785 Hyperlipidemia, unspecified: Secondary | ICD-10-CM

## 2021-11-26 MED ORDER — ROSUVASTATIN CALCIUM 40 MG PO TABS: 40.0000 mg | ORAL_TABLET | Freq: Every day | ORAL | 1 refills | Status: DC

## 2021-11-27 ENCOUNTER — Other Ambulatory Visit: Payer: Self-pay | Admitting: Family Medicine

## 2021-11-27 DIAGNOSIS — E785 Hyperlipidemia, unspecified: Secondary | ICD-10-CM

## 2021-12-03 ENCOUNTER — Telehealth: Payer: Self-pay | Admitting: Pharmacist

## 2021-12-03 NOTE — Telephone Encounter (Signed)
Received fax from [employer] requesting clarification on dosing despite it being written twice on form.  Confirmed and faxed back Erica Woods: [phone] Phone: [phone] Fax: [phone]  Knox Saliva, PharmD, MPH, BCPS, CPP Clinical Pharmacist (Rheumatology and Pulmonology)

## 2021-12-03 NOTE — Telephone Encounter (Signed)
Received fax from [employer] for PA renewal of patient's Taltz. Completed forms and faxed with clinicals  Fax: [phone]  Knox Saliva, PharmD, MPH, BCPS, CPP Clinical Pharmacist (Rheumatology and Pulmonology)

## 2021-12-08 NOTE — Telephone Encounter (Signed)
Received notification from [facility] regarding a prior authorization for [place]. Authorization has been APPROVED from 12/07/21 to 12/07/22.   Patient must fill through [place]: [phone]   Authorization # [id number]  Knox Saliva, PharmD, MPH, BCPS, CPP Clinical Pharmacist (Rheumatology and Pulmonology)

## 2021-12-11 ENCOUNTER — Ambulatory Visit: Payer: Commercial Managed Care - PPO | Admitting: Podiatry

## 2021-12-11 DIAGNOSIS — M7751 Other enthesopathy of right foot: Secondary | ICD-10-CM

## 2021-12-11 NOTE — Progress Notes (Signed)
No chief complaint on file.   Subjective: 56 y.o. female presenting today for follow-up evaluation of capsulitis to the right ankle as well as plantar fasciitis to the right heel.  Patient states that both are improved.  She says the injections did help.  She continues to have "catching" to her right ankle however.  Although it is not as severe it continues to cause sharp shooting pains elicited by certain movements or walking. Patient states that her plantar fasciitis has improved.  She continues to have some pain and sensitivity especially in the mornings getting out of bed but overall improvement   Past Medical History:  Diagnosis Date   Allergic rhinitis    Breast cancer ([place])    Chickenpox    GERD (gastroesophageal reflux disease)    Headache    Hemorrhoids    History of blood transfusion    Hypercholesteremia    Hyperthyroidism    Past Surgical History:  Procedure Laterality Date   ABDOMINAL SURGERY     APPENDECTOMY     BALLOON DILATION N/A 02/04/2017   Procedure: BALLOON DILATION;  Surgeon: [name], MD;  Location[facility] ENDOSCOPY;  Service: Gastroenterology;  Laterality: N/A;   BREAST BIOPSY Left 10/02/2014   Procedure: LEFT BREAST BIOPSY AFTER NEEDLE LOCALIZATION X TWO;  Surgeon: [name], MD;  Location: [facility] ORS;  Service: General;  Laterality: Left;   BREAST LUMPECTOMY Left 2016   benign   COLONOSCOPY N/A 10/23/2014   Procedure: COLONOSCOPY;  Surgeon: Rogene Houston, MD;  Location: [facility] ENDO SUITE;  Service: Endoscopy;  Laterality: N/A;  730   ESOPHAGOGASTRODUODENOSCOPY (EGD) WITH PROPOFOL N/A 02/04/2017   Procedure: ESOPHAGOGASTRODUODENOSCOPY (EGD) WITH PROPOFOL;  Surgeon: [name], MD;  Location[facility] ENDOSCOPY;  Service: Gastroenterology;  Laterality: N/A;   ESOPHAGOGASTRODUODENOSCOPY (EGD) WITH PROPOFOL N/A 09/17/2021   Procedure: ESOPHAGOGASTRODUODENOSCOPY (EGD) WITH PROPOFOL;  Surgeon: [name], MD;  Location[facility] ENDOSCOPY;  Service: Endoscopy;   Laterality: N/A;   LAPAROSCOPIC APPENDECTOMY  03/02/2012   Procedure: APPENDECTOMY LAPAROSCOPIC;  Surgeon: [name], MD;  Location: [facility] ORS;  Service: General;  Laterality: N/A;   OVARIAN CYST REMOVAL Left    TUBAL LIGATION     WISDOM TOOTH EXTRACTION Bilateral    Allergies  Allergen Reactions   Molnupiravir Rash   Gold-Containing Drug Products     Allergic to GOLD      Objective: Physical Exam General: The patient is alert and oriented x3 in no acute distress.  Dermatology: Skin is warm, dry and supple bilateral lower extremities. Negative for open lesions or macerations bilateral.   Vascular: Dorsalis Pedis and Posterior Tibial pulses palpable bilateral.  Capillary fill time is immediate to all digits.  Neurological: Epicritic and protective threshold intact bilateral.   Musculoskeletal: Tenderness to palpation to the plantar aspect of the right heel along the plantar fascia as well as the anterior lateral aspect of the right ankle. All other joints range of motion within normal limits bilateral. Strength 5/5 in all groups bilateral.   Radiographic exam 11/06/2021 RT foot and ankle: Normal osseous mineralization.  There is some degenerative changes noted throughout the ankle joint right foot.  Plantar and posterior heel spur noted on lateral view.  No acute fracture.   Assessment: 1. Plantar fasciitis right 2.  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 Patient evaluated.  2.  Continue meloxicam 15 mg daily 3.  Continue compression sleeve 4.  Ankle brace dispensed.  Wear daily 5.  Advised against going barefoot around the house.  Patient does admit to going barefoot around the house on hardwood floors 6.   if patient does not notice improvement over the next 3-4 weeks she may need MRI especially of the right ankle.  She will reach out to our office or reach out to me directly via MyChart to order an MRI of the RT ankle prior to the next  follow-up visit.  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 Edrick Kins, DPM    [address], [place] [address]                Office [phone]  Fax [phone]

## 2021-12-23 ENCOUNTER — Other Ambulatory Visit: Payer: Self-pay | Admitting: *Deleted

## 2021-12-23 DIAGNOSIS — Z79899 Other long term (current) drug therapy: Secondary | ICD-10-CM

## 2021-12-23 LAB — COMPLETE METABOLIC PANEL WITH GFR
AG Ratio: 1.7 (calc) (ref 1.0–2.5)
ALT: 12 U/L (ref 6–29)
AST: 13 U/L (ref 10–35)
Albumin: 4.3 g/dL (ref 3.6–5.1)
Alkaline phosphatase (APISO): 82 U/L (ref 37–153)
BUN: 19 mg/dL (ref 7–25)
CO2: 30 mmol/L (ref 20–32)
Calcium: 9.5 mg/dL (ref 8.6–10.4)
Chloride: 104 mmol/L (ref 98–110)
Creat: 0.72 mg/dL (ref 0.50–1.03)
Globulin: 2.5 g/dL (calc) (ref 1.9–3.7)
Glucose, Bld: 93 mg/dL (ref 65–99)
Potassium: 4.6 mmol/L (ref 3.5–5.3)
Sodium: 141 mmol/L (ref 135–146)
Total Bilirubin: 0.5 mg/dL (ref 0.2–1.2)
Total Protein: 6.8 g/dL (ref 6.1–8.1)
eGFR: 97 mL/min/{1.73_m2} (ref >=60)

## 2021-12-23 LAB — CBC WITH DIFFERENTIAL/PLATELET
Absolute Monocytes: 640 cells/uL (ref 200–950)
Basophils Absolute: 58 cells/uL (ref 0–200)
Basophils Relative: 0.9 %
Eosinophils Absolute: 128 cells/uL (ref 15–500)
Eosinophils Relative: 2 %
HCT: 38.6 % (ref 35.0–45.0)
Hemoglobin: 13.3 g/dL (ref 11.7–15.5)
Lymphs Abs: 2240 cells/uL (ref 850–3900)
MCH: 30.5 pg (ref 27.0–33.0)
MCHC: 34.5 g/dL (ref 32.0–36.0)
MCV: 88.5 fL (ref 80.0–100.0)
MPV: 11.9 fL (ref 7.5–12.5)
Monocytes Relative: 10 %
Neutro Abs: 3334 cells/uL (ref 1500–7800)
Neutrophils Relative %: 52.1 %
Platelets: 223 10*3/uL (ref 140–400)
RBC: 4.36 10*6/uL (ref 3.80–5.10)
RDW: 13.7 % (ref 11.0–15.0)
Total Lymphocyte: 35 %
WBC: 6.4 10*3/uL (ref 3.8–10.8)

## 2021-12-24 NOTE — Progress Notes (Signed)
CBC and CMP WNL

## 2022-01-04 ENCOUNTER — Other Ambulatory Visit (INDEPENDENT_AMBULATORY_CARE_PROVIDER_SITE_OTHER): Payer: Commercial Managed Care - PPO

## 2022-01-04 DIAGNOSIS — E785 Hyperlipidemia, unspecified: Secondary | ICD-10-CM

## 2022-01-04 LAB — HEPATIC FUNCTION PANEL
ALT: 12 U/L (ref 0–35)
AST: 12 U/L (ref 0–37)
Albumin: 4 g/dL (ref 3.5–5.2)
Alkaline Phosphatase: 71 U/L (ref 39–117)
Bilirubin, Direct: 0.1 mg/dL (ref 0.0–0.3)
Total Bilirubin: 0.4 mg/dL (ref 0.2–1.2)
Total Protein: 6.3 g/dL (ref 6.0–8.3)

## 2022-01-04 LAB — LDL CHOLESTEROL, DIRECT: Direct LDL: 80 mg/dL

## 2022-01-18 ENCOUNTER — Other Ambulatory Visit: Payer: Self-pay | Admitting: Physician Assistant

## 2022-01-18 DIAGNOSIS — L4050 Arthropathic psoriasis, unspecified: Secondary | ICD-10-CM

## 2022-01-18 DIAGNOSIS — L409 Psoriasis, unspecified: Secondary | ICD-10-CM

## 2022-01-18 DIAGNOSIS — Z79899 Other long term (current) drug therapy: Secondary | ICD-10-CM

## 2022-01-18 NOTE — Telephone Encounter (Signed)
Next Visit: 02/04/2022  Last Visit: 09/04/2021  Last Fill: 11/20/2021  OA:[id number] arthritis   Current Dose per office note 09/04/2021: Taltz 80 mg subcu every 4 weeks   Labs: 12/23/2021 CBC and CMP WNL   TB Gold: 2/72023  TB Gold is negative.   Okay to refill Taltz?

## 2022-01-22 NOTE — Progress Notes (Unsigned)
Office Visit Note  Patient: Erica Woods             Date of Birth: [birth year]           MRN: [id number]             PCP: Leone Haven, MD Referring: Leone Haven, MD Visit Date: 02/04/2022 Occupation: '@GUAROCC'$ @  Subjective:  Medication monitoring   History of Present Illness: Erica Woods is a 56 y.o. female with history of psoriatic arthritis and osteoarthritis.  She is currently on Taltz 80 mg sq injections every 28 days.  She is tolerating Taltz without any side effects or injection site reactions.  She has not missed any doses recently.  Her last dose of Taltz was administered on Tuesday.  Patient reports that her psoriasis had completely cleared while on Taltz but recently she has had a minor flare which she attributes to colder weather temperatures.  She has been trying to moisturize the patches on her elbows and right wrist.  She continues to have intermittent discomfort due to plantar fasciitis of both feet.  She has been wearing orthotics and supportive shoes on a daily basis.  She has had injections in the past performed by Dr. Amalia Hailey which were helpful.  She takes meloxicam very sparingly for pain relief.  She has occasional discomfort in her SI joints.  She has been using Voltaren gel topically as needed for pain relief. She denies any recent or recurrent infections.  She denies any new medical conditions.     Activities of Daily Living:  Patient reports morning stiffness for a few minutes.   Patient Reports nocturnal pain.  Difficulty dressing/grooming: Denies Difficulty climbing stairs: Denies Difficulty getting out of chair: Denies Difficulty using hands for taps, buttons, cutlery, and/or writing: Denies  Review of Systems  Constitutional:  Negative for fatigue.  HENT:  Negative for mouth sores and mouth dryness.   Eyes:  Positive for dryness.  Respiratory:  Negative for shortness of breath.   Cardiovascular:  Negative for chest pain and palpitations.   Gastrointestinal:  Negative for blood in stool, constipation and diarrhea.  Endocrine: Negative for increased urination.  Genitourinary:  Negative for involuntary urination.  Musculoskeletal:  Positive for joint swelling and morning stiffness. Negative for joint pain, gait problem, joint pain, myalgias, muscle weakness, muscle tenderness and myalgias.  Skin:  Negative for color change, rash, hair loss and sensitivity to sunlight.  Allergic/Immunologic: Negative for susceptible to infections.  Neurological:  Negative for dizziness and headaches.  Hematological:  Negative for swollen glands.  Psychiatric/Behavioral:  Negative for depressed mood and sleep disturbance. The patient is not nervous/anxious.     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   Vitamin D deficiency 05/25/2021   Postmenopausal bleeding 11/24/2020   Rash 03/26/2020   Prediabetes 09/20/2018   Stress 11/14/2017   Hot flashes 07/29/2017   Hyperlipidemia 07/29/2017   Hypothyroidism 11/08/2016   Muscle cramps 11/08/2016   Plantar fasciitis, left 07/05/2016   Psoriasis 07/05/2016   Arthralgia of both hands 05/04/2016   History of breast cancer 11/19/2015   Routine general medical examination at a health care facility 05/02/2015   Migraines 11/25/2014   GERD (gastroesophageal reflux disease) 11/25/2014   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 11/22/2014    Past Medical History:  Diagnosis Date   Allergic rhinitis    Breast cancer ([place])    Chickenpox  GERD (gastroesophageal reflux disease)    Headache    Hemorrhoids    History of blood transfusion    Hypercholesteremia    Hyperthyroidism     Family History  Problem Relation Age of Onset   Diabetes Mother    Heart attack Father    Hypertension Other    Diabetes Other    Diabetes Sister    Cancer Sister    Tuberculosis Brother    Irregular heart beat Brother    Parkinson's disease  Sister    Healthy Son    Irregular heart beat Son    Healthy Son    Past Surgical History:  Procedure Laterality Date   ABDOMINAL SURGERY     APPENDECTOMY     BALLOON DILATION N/A 02/04/2017   Procedure: Larrie Kass DILATION;  Surgeon: [name], MD;  Location: [facility] ENDOSCOPY;  Service: Gastroenterology;  Laterality: N/A;   BREAST BIOPSY Left 10/02/2014   Procedure: LEFT BREAST BIOPSY AFTER NEEDLE LOCALIZATION X TWO;  Surgeon: [name], MD;  Location: [facility] ORS;  Service: General;  Laterality: Left;   BREAST LUMPECTOMY Left 2016   benign   COLONOSCOPY N/A 10/23/2014   Procedure: COLONOSCOPY;  Surgeon: Rogene Houston, MD;  Location: [facility] ENDO SUITE;  Service: Endoscopy;  Laterality: N/A;  730   ESOPHAGOGASTRODUODENOSCOPY (EGD) WITH PROPOFOL N/A 02/04/2017   Procedure: ESOPHAGOGASTRODUODENOSCOPY (EGD) WITH PROPOFOL;  Surgeon: [name], MD;  Location: [facility] ENDOSCOPY;  Service: Gastroenterology;  Laterality: N/A;   ESOPHAGOGASTRODUODENOSCOPY (EGD) WITH PROPOFOL N/A 09/17/2021   Procedure: ESOPHAGOGASTRODUODENOSCOPY (EGD) WITH PROPOFOL;  Surgeon: [name], MD;  Location: [facility] ENDOSCOPY;  Service: Endoscopy;  Laterality: N/A;   LAPAROSCOPIC APPENDECTOMY  03/02/2012   Procedure: APPENDECTOMY LAPAROSCOPIC;  Surgeon: [name], MD;  Location: [facility] ORS;  Service: General;  Laterality: N/A;   OVARIAN CYST REMOVAL Left    TUBAL LIGATION     WISDOM TOOTH EXTRACTION Bilateral    Social History   Social History Narrative   Not on file   Immunization History  Administered Date(s) Administered   Influenza,inj,Quad PF,6+ Mos 11/14/2017, 11/24/2020   Influenza-Unspecified 11/17/2015, 12/12/2018, 12/03/2019   Moderna Sars-Covid-2 Vaccination 06/05/2019, 07/03/2019, 01/18/2020, 07/04/2020   PNEUMOCOCCAL CONJUGATE-20 11/24/2020   Tdap 05/02/2015   Zoster Recombinat (Shingrix) 05/09/2017, 07/12/2017     Objective: Vital Signs: BP 126/67 (BP Location: Left Arm, Patient Position: Sitting,  Cuff Size: Normal)   Pulse (!) 52   Resp 15   Ht '5\' 4"'$  (1.626 m)   Wt 166 lb 9.6 oz (75.6 kg)   LMP 09/17/2014   BMI 28.60 kg/m    Physical Exam Vitals and nursing note reviewed.  Constitutional:      Appearance: She is well-developed.  HENT:     Head: Normocephalic and atraumatic.  Eyes:     Conjunctiva/sclera: Conjunctivae normal.  Cardiovascular:     Rate and Rhythm: Normal rate and regular rhythm.     Heart sounds: Normal heart sounds.  Pulmonary:     Effort: Pulmonary effort is normal.     Breath sounds: Normal breath sounds.  Abdominal:     General: Bowel sounds are normal.     Palpations: Abdomen is soft.  Musculoskeletal:     Cervical back: Normal range of motion.  Skin:    General: Skin is warm and dry.     Capillary Refill: Capillary refill takes less than 2 seconds.     Comments: Dry scaling on ulnar aspect of right wrist. Small patches of psoriasis on extensor surface  of elbows   Neurological:     Mental Status: She is alert and oriented to person, place, and time.  Psychiatric:        Behavior: Behavior normal.      Musculoskeletal Exam: C-spine, thoracic spine, lumbar spine have good range of motion.  No midline spinal tenderness.  Mild SI joint tenderness bilaterally.  Shoulder joints, elbow joints, wrist joints, MCPs, PIPs, DIPs have good range of motion with no synovitis.  Complete fist formation bilaterally.  Hip joints have good range of motion with no groin pain.  No tenderness over trochanteric bursa bilaterally.  Knee joints have good range of motion with no warmth or effusion.  Ankle joints have good range of motion with no tenderness or joint swelling.  Tenderness over the plantar fascia bilaterally.  No evidence of Achilles tendinitis.  No tenderness or synovitis over MTP joints.  CDAI Exam: CDAI Score: -- Patient Global: --; Provider Global: -- Swollen: --; Tender: -- Joint Exam 02/04/2022   No joint exam has been documented for this visit    There is currently no information documented on the homunculus. Go to the Rheumatology activity and complete the homunculus joint exam.  Investigation: No additional findings.  Imaging: No results found.  Recent Labs: Lab Results  Component Value Date   WBC 6.4 12/23/2021   HGB 13.3 12/23/2021   PLT 223 12/23/2021   NA 141 12/23/2021   K 4.6 12/23/2021   CL 104 12/23/2021   CO2 30 12/23/2021   GLUCOSE 93 12/23/2021   BUN 19 12/23/2021   CREATININE 0.72 12/23/2021   BILITOT 0.4 01/04/2022   ALKPHOS 71 01/04/2022   AST 12 01/04/2022   ALT 12 01/04/2022   PROT 6.3 01/04/2022   ALBUMIN 4.0 01/04/2022   CALCIUM 9.5 12/23/2021   GFRAA 115 06/16/2020   QFTBGOLDPLUS NEGATIVE 03/24/2021    Speciality Comments: MTX tabs 04/14/20-07/26/20 (stopped d/t nausea from folic acid); switched to Tremfya 08/04/20  Procedures:  No procedures performed Allergies: Molnupiravir and Gold-containing drug products   Assessment / Plan:     Visit Diagnoses: Psoriatic arthritis ([place]): She has no synovitis or dactylitis on examination today.  She has not had any signs or symptoms of a psoriatic arthritis flare.  She has clinically been doing well on Taltz 80 mg subcutaneous injections every 4 weeks.  Her last dose of Taltz was administered on Tuesday.  She has been tolerating Taltz without any side effects or injection site reactions.  She has not missed any doses recently.  She has not had any recent or recurrent infections.  She continues to have intermittent discomfort due to plantar fasciitis bilaterally.  She has been wearing orthotics which have been helpful.  In the past she had bilateral plantar fascia cortisone injections as well as has taken meloxicam as needed when her symptoms were more active.  She has no evidence of Achilles tendinitis at this time.  She has mild SI joint tenderness upon palpation currently.  Overall her psoriatic arthritis remains well-controlled on Toltz.  No medication  changes will be made at this time.  She was advised to notify us if she develops signs or symptoms of a flare.  She will follow-up in the office in 5 months or sooner if needed.  Psoriasis: Small patches of plaque psoriasis on the extensor surface of both elbows.  Patient has a prescription for triamcinolone cream which she can apply topically as needed.  She does not need a refill at this time.  She will remain on Toltz as prescribed.  High risk medication use - Taltz-initiated on 02/24/2021.  She is currently on Taltz 80 mg subcutaneous injections every 4 weeks.   Previous therapy: MTX and tremfya. CBC and CMP updated on 12/23/21. Her next lab work will be due in February and every 3 months.   TB gold negative on 03/24/21.  Future order for TB gold released today.   Discussed the importance of holding taltz if she develops signs or symptoms of an infection and to resume once the infection has completely cleared.  - Plan: QuantiFERON-TB Gold Plus  Screening for tuberculosis - Future order for TB gold released today. Plan: QuantiFERON-TB Gold Plus  Primary osteoarthritis of both hands: No tenderness or synovitis noted.   Chronic right SI joint pain:She has mild SI joint tenderness bilaterally.  Her symptoms have been intermittent and are better controlled on taltz.   Plantar fasciitis, bilateral: She continues to experience intermittent discomfort due to plantar fasciitis of both feet.  She has been wearing orthotics with proper fitting shoes which have been helpful.  She has had cortisone injections performed by Dr. Amalia Hailey previously.  She takes meloxicam sparingly for episodic pain management.   Trochanteric bursitis of both hips: Resolved.  No tenderness upon palpation today.  Primary osteoarthritis of both feet: Under the care of Dr. Amalia Hailey.  She continues to experience intermittent discomfort due to a plantar fasciitis of both feet.  She has been wearing orthotics and supportive shoes as advised.   She has had plantar fascia cortisone injections in the past which have been helpful.  She takes meloxicam sparingly for pain relief.  Arthropathy of lumbar facet joint: No midline spinal tenderness at this time.  No symptoms of radiculopathy.  Other medical conditions are listed as follows:  Vitamin D deficiency: She is taking a daily vitamin D supplement.   Prediabetes  History of gastroesophageal reflux (GERD)  Lobular carcinoma in situ (LCIS) of left breast  History of hyperlipidemia  Hx of migraines  History of hypothyroidism   Orders: Orders Placed This Encounter  Procedures   QuantiFERON-TB Gold Plus   No orders of the defined types were placed in this encounter.    Follow-Up Instructions: Return in about 5 months (around 07/06/2022) for Psoriatic arthritis, Osteoarthritis.   Ofilia Neas, PA-C  Note - This record has been created using Dragon software.  Chart creation errors have been sought, but may not always  have been located. Such creation errors do not reflect on  the standard of medical care.

## 2022-02-01 ENCOUNTER — Other Ambulatory Visit: Payer: Self-pay

## 2022-02-04 ENCOUNTER — Encounter: Payer: Self-pay | Admitting: Physician Assistant

## 2022-02-04 ENCOUNTER — Ambulatory Visit: Payer: Commercial Managed Care - PPO | Attending: Physician Assistant | Admitting: Physician Assistant

## 2022-02-04 VITALS — BP 126/67 | HR 52 | Resp 15 | Ht 64.0 in | Wt 166.6 lb

## 2022-02-04 DIAGNOSIS — M7061 Trochanteric bursitis, right hip: Secondary | ICD-10-CM

## 2022-02-04 DIAGNOSIS — L4050 Arthropathic psoriasis, unspecified: Secondary | ICD-10-CM

## 2022-02-04 DIAGNOSIS — M19041 Primary osteoarthritis, right hand: Secondary | ICD-10-CM

## 2022-02-04 DIAGNOSIS — L409 Psoriasis, unspecified: Secondary | ICD-10-CM | POA: Diagnosis not present

## 2022-02-04 DIAGNOSIS — M722 Plantar fascial fibromatosis: Secondary | ICD-10-CM

## 2022-02-04 DIAGNOSIS — M533 Sacrococcygeal disorders, not elsewhere classified: Secondary | ICD-10-CM

## 2022-02-04 DIAGNOSIS — M19072 Primary osteoarthritis, left ankle and foot: Secondary | ICD-10-CM

## 2022-02-04 DIAGNOSIS — Z79899 Other long term (current) drug therapy: Secondary | ICD-10-CM | POA: Diagnosis not present

## 2022-02-04 DIAGNOSIS — E559 Vitamin D deficiency, unspecified: Secondary | ICD-10-CM

## 2022-02-04 DIAGNOSIS — D0502 Lobular carcinoma in situ of left breast: Secondary | ICD-10-CM

## 2022-02-04 DIAGNOSIS — G8929 Other chronic pain: Secondary | ICD-10-CM

## 2022-02-04 DIAGNOSIS — M7062 Trochanteric bursitis, left hip: Secondary | ICD-10-CM

## 2022-02-04 DIAGNOSIS — M47816 Spondylosis without myelopathy or radiculopathy, lumbar region: Secondary | ICD-10-CM

## 2022-02-04 DIAGNOSIS — Z8719 Personal history of other diseases of the digestive system: Secondary | ICD-10-CM

## 2022-02-04 DIAGNOSIS — Z111 Encounter for screening for respiratory tuberculosis: Secondary | ICD-10-CM

## 2022-02-04 DIAGNOSIS — Z8669 Personal history of other diseases of the nervous system and sense organs: Secondary | ICD-10-CM

## 2022-02-04 DIAGNOSIS — Z8639 Personal history of other endocrine, nutritional and metabolic disease: Secondary | ICD-10-CM

## 2022-02-04 DIAGNOSIS — R7303 Prediabetes: Secondary | ICD-10-CM

## 2022-02-04 DIAGNOSIS — M19071 Primary osteoarthritis, right ankle and foot: Secondary | ICD-10-CM

## 2022-02-04 DIAGNOSIS — M19042 Primary osteoarthritis, left hand: Secondary | ICD-10-CM

## 2022-02-04 NOTE — Patient Instructions (Signed)
Standing Labs We placed an order today for your standing lab work.   Please have your standing labs drawn in February and every 3 months   Please have your labs drawn 2 weeks prior to your appointment so that the provider can discuss your lab results at your appointment.  Please note that you may see your imaging and lab results in MyChart before we have reviewed them. We will contact you once all results are reviewed. Please allow our office up to 72 hours to thoroughly review all of the results before contacting the office for clarification of your results.  Lab hours are:   Monday through Thursday from 8:00 am -12:30 pm and 1:00 pm-5:00 pm and Friday from 8:00 am-12:00 pm.  Please be advised, all patients with office appointments requiring lab work will take precedent over walk-in lab work.   Labs are drawn by [facility]. Please bring your co-pay at the time of your lab draw.  You may receive a bill from [facility] for your lab work.  Please note if you are on Hydroxychloroquine and and an order has been placed for a Hydroxychloroquine level, you will need to have it drawn 4 hours or more after your last dose.  If you wish to have your labs drawn at another location, please call the office 24 hours in advance so we can fax the orders.  The office is located at [address], [address], [place], [place] [address] No appointment is necessary.    If you have any questions regarding directions or hours of operation,  please call [phone].   As a reminder, please drink plenty of water prior to coming for your lab work. Thanks!  

## 2022-02-11 ENCOUNTER — Encounter: Payer: Self-pay | Admitting: Gastroenterology

## 2022-02-11 ENCOUNTER — Ambulatory Visit
Admission: RE | Admit: 2022-02-11 | Discharge: 2022-02-11 | Disposition: A | Discharge status: Home / Self Care | Payer: Commercial Managed Care - PPO | Source: Ambulatory Visit | Attending: Family Medicine | Admitting: Family Medicine

## 2022-02-11 ENCOUNTER — Ambulatory Visit: Payer: Commercial Managed Care - PPO | Admitting: Gastroenterology

## 2022-02-11 VITALS — BP 123/77 | HR 68 | Temp 97.8°F | Wt 169.0 lb

## 2022-02-11 DIAGNOSIS — Z13820 Encounter for screening for osteoporosis: Secondary | ICD-10-CM | POA: Diagnosis not present

## 2022-02-11 DIAGNOSIS — Z780 Asymptomatic menopausal state: Secondary | ICD-10-CM | POA: Insufficient documentation

## 2022-02-11 DIAGNOSIS — K200 Eosinophilic esophagitis: Secondary | ICD-10-CM

## 2022-02-11 DIAGNOSIS — M8589 Other specified disorders of bone density and structure, multiple sites: Secondary | ICD-10-CM | POA: Insufficient documentation

## 2022-02-11 DIAGNOSIS — E559 Vitamin D deficiency, unspecified: Secondary | ICD-10-CM | POA: Insufficient documentation

## 2022-02-11 MED ORDER — ESOMEPRAZOLE MAGNESIUM 20 MG PO CPDR: 20.0000 mg | DELAYED_RELEASE_CAPSULE | Freq: Every day | ORAL | 3 refills | Status: DC

## 2022-02-11 NOTE — Progress Notes (Signed)
[name] MD, MRCP(U.K) [address]  [place]  [place], [place] [address]  Main: [phone]  Fax: [phone]   Primary Care Physician: Leone Haven, MD  Primary Gastroenterologist:  Dr. [name]   Chief Complaint  Patient presents with   Dysphagia    HPI: Erica Woods is a 56 y.o. female   Summary of history :  She has a  diagnosis of eosinophilic esophagitis based on an endoscopy I performed in 2018.  She also had a mild Schatzki's ring that was dilated.  She is doing well on regular use of omeprazole. Called in with dysphagia and upper endoscopy was performed on 09/17/2021 by myself a small hiatal hernia was noted features of eosinophilic esophagitis were noted from the mucosa biopsies showed up to 20 high-power field eosinophils per field.  She is here today to discuss results.   Does not recollect taking any fluticasone or budesonide in the past.  She has been on omeprazole 40 mg once a day.  Interval history   10/08/2021- 02/11/2022   10/08/2021: Food allergy profile - normal  To that she is doing well no significant difficulty with swallowing.  She thinks she is taking Nexium 40 mg once a day first thing in the morning before breakfast no other symptoms.  She recently noticed that once she had some food at a [employer] had some form of an allergic reaction and took Benadryl.   Current Outpatient Medications  Medication Sig Dispense Refill   aspirin-acetaminophen-caffeine (EXCEDRIN MIGRAINE) 250-250-65 MG tablet Take 1 tablet by mouth every 6 (six) hours as needed for headache. 30 tablet 0   Cyanocobalamin (VITAMIN B-12 PO) Take by mouth daily.     esomeprazole (NEXIUM) 20 MG capsule Take 1 capsule (20 mg total) by mouth daily at 12 noon. 90 capsule 3   fluticasone (FLOVENT HFA) 220 MCG/ACT inhaler Inhale 2 puffs into the lungs in the morning and at bedtime. 2 puffs twice a day, swallow and rinse mouth after for 8 weeks. 1 each 1   meloxicam  (MOBIC) 15 MG tablet Take 1 tablet (15 mg total) by mouth daily. 30 tablet 1   rosuvastatin (CRESTOR) 40 MG tablet Take 1 tablet (40 mg total) by mouth daily. 90 tablet 1   TALTZ 80 MG/ML SOAJ INJECT '80MG'$  SUBCUTANEOUSLY EVERY 4 WEEKS 1 mL 2   thyroid (NP THYROID) 30 MG tablet TAKE 1 & 1/2 (ONE & ONE-HALF) TABLETS BY MOUTH ONCE DAILY BEFORE BREAKFAST 135 tablet 1   triamcinolone cream (KENALOG) 0.1 % Apply to affected area twice daily as needed. 30 g 0   VITAMIN D PO Take by mouth daily.     Current Facility-Administered Medications  Medication Dose Route Frequency Provider Last Rate Last Admin   cyanocobalamin ((VITAMIN B-12)) injection 1,000 mcg  1,000 mcg Intramuscular Once Leone Haven, MD        Allergies as of 02/11/2022 - Review Complete 02/11/2022  Allergen Reaction Noted   Molnupiravir Rash 01/14/2021   Gold-containing drug products  03/14/2017    ROS:  General: Negative for anorexia, weight loss, fever, chills, fatigue, weakness. ENT: Negative for hoarseness, difficulty swallowing , nasal congestion. CV: Negative for chest pain, angina, palpitations, dyspnea on exertion, peripheral edema.  Respiratory: Negative for dyspnea at rest, dyspnea on exertion, cough, sputum, wheezing.  GI: See history of present illness. GU:  Negative for dysuria, hematuria, urinary incontinence, urinary frequency, nocturnal urination.  Endo: Negative for unusual weight change.  Physical Examination:   BP 123/77   Pulse 68   Temp 97.8 F (36.6 C) (Oral)   Wt 169 lb (76.7 kg)   LMP 09/17/2014   BMI 29.01 kg/m   General: Well-nourished, well-developed in no acute distress.  Eyes: No icterus. Conjunctivae pink. Neuro: Alert and oriented x 3.  Grossly intact. Skin: Warm and dry, no jaundice.   Psych: Alert and cooperative, normal mood and affect.   Imaging Studies: DG Bone Density  Result Date: 02/11/2022 EXAM: DUAL X-RAY ABSORPTIOMETRY (DXA) FOR BONE MINERAL DENSITY IMPRESSION:  Dear Dr Caryl Bis, Your patient ALANEE TING completed a FRAX assessment on 02/11/2022 using the [place] (analysis version: 14.10) manufactured by [employer]. The following summarizes the results of our evaluation. PATIENT BIOGRAPHICAL: Name: Demetrius, Barrell Patient ID: [id number] Birth Date: 01-Aug-1965 Height:    64.0 in. Gender:     Female    Age:        39.8       Weight:    166.6 lbs. Ethnicity:  White                            Exam Date: 02/11/2022 FRAX* RESULTS:  (version: 3.5) 10-year Probability of Fracture1 Major Osteoporotic Fracture2 Hip Fracture 13.9% 1.5% Population: [place] (Caucasian) Risk Factors: History of Fracture (Adult) Based on Femur (Left) Neck BMD 1 -The 10-year probability of fracture may be lower than reported if the patient has received treatment. 2 -Major Osteoporotic Fracture: Clinical Spine, Forearm, Hip or Shoulder *FRAX is a Materials engineer of the [employer] for Metabolic Bone Disease, a [employer] (WHO) [facility]. ASSESSMENT: The probability of a major osteoporotic fracture is 13.9% within the next ten years. The probability of a hip fracture is 1.5% within the next ten years. Your patient Jacquita Mulhearn completed a BMD test on 02/11/2022 using the [place] (software version: 14.10) manufactured by [employer]. The following summarizes the results of our evaluation. Technologist: MTB PATIENT BIOGRAPHICAL: Name: Rayna, Brenner Patient ID: [id number] Birth Date: Dec 03, 1965 Height: 64.0 in. Gender: Female Exam Date: 02/11/2022 Weight: 166.6 lbs. Indications: Caucasian, History of Fracture (Adult), Hypothyroid, Postmenopausal, Vitamin D Deficiency Fractures: Ankle Right Treatments: NP Thyroid, Vitamin D DENSITOMETRY RESULTS: Site         Region     Measured Date Measured Age WHO Classification Young Adult T-score BMD         %Change vs. Previous Significant Change (*) AP Spine L1-L4 02/11/2022  57.8 Normal -0.3 1.159 g/cm2 - - DualFemur Neck Left 02/11/2022 57.8 Osteopenia -1.8 0.795 g/cm2 - - DualFemur Total Mean 02/11/2022 57.8 Normal -0.8 0.901 g/cm2 - - Left Forearm Radius 33% 02/11/2022 57.8 Osteopenia -1.2 0.770 g/cm2 - - ASSESSMENT: The BMD measured at Femur Neck Left is 0.795 g/cm2 with a T-score of -1.8. This patient is considered osteopenic according to [facility]) criteria. The scan quality is good. World Pharmacologist [facility]) criteria for post-menopausal, Caucasian Women: Normal:                   T-score at or above -1 SD Osteopenia/low bone mass: T-score between -1 and -2.5 SD Osteoporosis:             T-score at or below -2.5 SD RECOMMENDATIONS: 1. All patients should optimize calcium and vitamin D intake. 2. Consider FDA-approved medical therapies in postmenopausal women and men  aged 9 years and older, based on the following: a. A hip or vertebral(clinical or morphometric) fracture b. T-score < -2.5 at the femoral neck or spine after appropriate evaluation to exclude secondary causes c. Low bone mass (T-score between -1.0 and -2.5 at the femoral neck or spine) and a 10-year probability of a hip fracture > 3% or a 10-year probability of a major osteoporosis-related fracture > 20% based on the [place]-adapted WHO algorithm 3. Clinician judgment and/or patient preferences may indicate treatment for people with 10-year fracture probabilities above or below these levels FOLLOW-UP: People with diagnosed cases of osteoporosis or at high risk for fracture should have regular bone mineral density tests. For patients eligible for Medicare, routine testing is allowed once every 2 years. The testing frequency can be increased to one year for patients who have rapidly progressing disease, those who are receiving or discontinuing medical therapy to restore bone mass, or have additional risk factors. I have reviewed this report, and agree with the above findings. [facility] Electronically Signed   By: Ammie Ferrier M.D.   On: 02/11/2022 11:07    Assessment and Plan:   DEJON JUNGMAN is a 56 y.o. y/o female here to see me today for dysphagia.  EGD and biopsies confirm eosinophilic esophagitis.  Had a course of budesonide as she did not respond to PPI alone and since has been doing well presently on 40 mg of Nexium once a day we discussed about the long-term effects of PPI use and the aim to use the lowest dose for the shortest.  Of time and hence she is willing to try to reduce the dose to 20 mg of Nexium once a day for which I will provide her a new prescription.  We have discussed that if she has recurrence of dysphagia in the future we can consider starting her on Dupixent.     Dr [name]  MD,MRCP [facility]) Follow up in as needed

## 2022-02-22 ENCOUNTER — Ambulatory Visit: Payer: Commercial Managed Care - PPO | Admitting: Family Medicine

## 2022-02-22 ENCOUNTER — Encounter: Payer: Self-pay | Admitting: Family Medicine

## 2022-02-22 VITALS — BP 120/80 | HR 62 | Temp 98.5°F | Ht 64.0 in | Wt 167.2 lb

## 2022-02-22 DIAGNOSIS — E785 Hyperlipidemia, unspecified: Secondary | ICD-10-CM

## 2022-02-22 DIAGNOSIS — Z853 Personal history of malignant neoplasm of breast: Secondary | ICD-10-CM

## 2022-02-22 DIAGNOSIS — E039 Hypothyroidism, unspecified: Secondary | ICD-10-CM

## 2022-02-22 DIAGNOSIS — K219 Gastro-esophageal reflux disease without esophagitis: Secondary | ICD-10-CM

## 2022-02-22 DIAGNOSIS — R7303 Prediabetes: Secondary | ICD-10-CM

## 2022-02-22 NOTE — Patient Instructions (Signed)
Nice to see you. Will get lab work in the next couple of weeks.

## 2022-02-22 NOTE — Assessment & Plan Note (Addendum)
Mammogram up to date. Breast exam completed today. The patient will monitor for any breast changes and let us know if any new findings.  Patient does have dense breast tissue.  She is aware that this makes imaging and diagnosis of breast lesions more difficult.

## 2022-02-22 NOTE — Assessment & Plan Note (Addendum)
Given patient's muscle aches on Crestor, we will discontinue it. Explained to patient that current guidelines recommend diabetic patients to be on a stain as well. Patient does not have an official diagnosis of diabetes yet, we will get new A1c and LDL labs today and will switch to pravastatin if she meets criteria.

## 2022-02-22 NOTE — Progress Notes (Signed)
Patient seen along with medical student Zada Zhong.  I personally evaluated this patient along with the student, and verified all aspects of the history, physical exam, and medical decision making as documented by the student.  I agree with the student's documentation and have made all necessary edits.  Keari Miu, MD  

## 2022-02-22 NOTE — Assessment & Plan Note (Addendum)
Chronic issue. Continue NP thyroid '45mg'$  daily.

## 2022-02-22 NOTE — Assessment & Plan Note (Signed)
Will check A1c today.

## 2022-02-22 NOTE — Progress Notes (Signed)
Tommi Rumps, MD Phone: [phone]  Erica Woods is a 57 y.o. female who presents today for f/u.   Hypothyroidism: patient denies any new heat/cold intolerance or skin changes. She takes her NP Thyroid medication daily.  Eosinophilic esophagitis: Patient was prescribed esomeprazole from her GI doctor. She denies dysphagia or reflux symptoms.   Hyperlipidemia: patient stopped taking her Crestor after five weeks of therapy due to severe muscle aches. She states that it felt like "my muscles were being torn away from the bone." She has been trying over-the-counter supplement CholestOff and she is hesitant to try another statin due to the myalgias. She currently denies any muscle aches.  Prediabetes: patient states that her diet has improved since she and her husband stopped eating her mother's food because her mother added butter to everything. She feels discomfort from her plantar fascitis, but tries to do jumping jacks or sit-ups three times a week for exercise. She denies any polyuria or polydipsia.   Social History   Tobacco Use  Smoking Status Never   Passive exposure: Never  Smokeless Tobacco Never    Current Outpatient Medications on File Prior to Visit  Medication Sig Dispense Refill   aspirin-acetaminophen-caffeine (EXCEDRIN MIGRAINE) 250-250-65 MG tablet Take 1 tablet by mouth every 6 (six) hours as needed for headache. 30 tablet 0   Cyanocobalamin (VITAMIN B-12 PO) Take by mouth daily.     esomeprazole (NEXIUM) 20 MG capsule Take 1 capsule (20 mg total) by mouth daily at 12 noon. 90 capsule 3   meloxicam (MOBIC) 15 MG tablet Take 1 tablet (15 mg total) by mouth daily. 30 tablet 1   rosuvastatin (CRESTOR) 40 MG tablet Take 1 tablet (40 mg total) by mouth daily. 90 tablet 1   TALTZ 80 MG/ML SOAJ INJECT '80MG'$  SUBCUTANEOUSLY EVERY 4 WEEKS 1 mL 2   thyroid (NP THYROID) 30 MG tablet TAKE 1 & 1/2 (ONE & ONE-HALF) TABLETS BY MOUTH ONCE DAILY BEFORE BREAKFAST 135 tablet 1    triamcinolone cream (KENALOG) 0.1 % Apply to affected area twice daily as needed. 30 g 0   VITAMIN D PO Take by mouth daily.     Current Facility-Administered Medications on File Prior to Visit  Medication Dose Route Frequency Provider Last Rate Last Admin   cyanocobalamin ((VITAMIN B-12)) injection 1,000 mcg  1,000 mcg Intramuscular Once Leone Haven, MD         ROS see history of present illness  Objective  Physical Exam Vitals:   02/22/22 1005  BP: 120/80  Pulse: 62  Temp: 98.5 F (36.9 C)  SpO2: 99%    BP Readings from Last 3 Encounters:  02/22/22 120/80  02/11/22 123/77  02/04/22 126/67   Wt Readings from Last 3 Encounters:  02/22/22 167 lb 3.2 oz (75.8 kg)  02/11/22 169 lb (76.7 kg)  02/04/22 166 lb 9.6 oz (75.6 kg)    Physical Exam Exam conducted with a chaperone present.  Constitutional:      Appearance: Normal appearance.  HENT:     Head: Normocephalic and atraumatic.  Cardiovascular:     Rate and Rhythm: Normal rate and regular rhythm.  Pulmonary:     Effort: Pulmonary effort is normal.     Breath sounds: Normal breath sounds.  Chest:     Comments: Marisa Cyphers served as chaperone, Tommi Rumps MD performed the breast exam, postsurgical changes at the 3:00 location in the left breast, unchanged, otherwise no masses, tenderness, skin changes, or nipple inversion, no axillary masses  bilaterally, patient's breast tissue is dense Skin:    General: Skin is warm and dry.  Neurological:     Mental Status: She is alert.      Assessment/Plan: Please see individual problem list.  Problem List Items Addressed This Visit     GERD (gastroesophageal reflux disease) (Chronic)    Chronic issue. She will continue nexium 20 mg daily. Patient will see her GI doctor as needed.       Hypothyroidism (Chronic)    Chronic issue. Continue NP thyroid '45mg'$  daily.      History of breast cancer    Mammogram up to date. Breast exam completed today. The patient  will monitor for any breast changes and let us know if any new findings.  Patient does have dense breast tissue.  She is aware that this makes imaging and diagnosis of breast lesions more difficult.      Hyperlipidemia - Primary    Given patient's muscle aches on Crestor, we will discontinue it. Explained to patient that current guidelines recommend diabetic patients to be on a stain as well. Patient does not have an official diagnosis of diabetes yet, we will get new A1c and LDL labs today and will switch to pravastatin if she meets criteria.       Relevant Orders   LDL cholesterol, direct   Prediabetes    Will check A1c today.      Relevant Orders   HgB A1c     Health Maintenance: breast exam completed today, patient will get annual mammogram in July.  Return in 9 months (on 11/26/2022) for physical, labs in 2 weeks.   Marisa Cyphers, Medical Student [place]

## 2022-02-22 NOTE — Assessment & Plan Note (Addendum)
Chronic issue. She will continue nexium 20 mg daily. Patient will see her GI doctor as needed.

## 2022-02-26 ENCOUNTER — Other Ambulatory Visit: Payer: Self-pay

## 2022-02-26 ENCOUNTER — Telehealth: Payer: Self-pay | Admitting: Family Medicine

## 2022-02-26 DIAGNOSIS — E039 Hypothyroidism, unspecified: Secondary | ICD-10-CM

## 2022-02-26 MED ORDER — THYROID 30 MG PO TABS: ORAL_TABLET | ORAL | 1 refills | Status: AC

## 2022-02-26 NOTE — Telephone Encounter (Signed)
Medication sent to pharmacy.  ng

## 2022-02-26 NOTE — Telephone Encounter (Signed)
Pt need a refill on thyroid sent to [facility]

## 2022-03-04 ENCOUNTER — Telehealth: Payer: Self-pay | Admitting: Family Medicine

## 2022-03-04 DIAGNOSIS — E039 Hypothyroidism, unspecified: Secondary | ICD-10-CM

## 2022-03-08 ENCOUNTER — Other Ambulatory Visit: Payer: Self-pay | Admitting: Family Medicine

## 2022-03-08 ENCOUNTER — Other Ambulatory Visit (INDEPENDENT_AMBULATORY_CARE_PROVIDER_SITE_OTHER): Payer: Commercial Managed Care - PPO

## 2022-03-08 ENCOUNTER — Other Ambulatory Visit: Payer: Self-pay

## 2022-03-08 DIAGNOSIS — R7303 Prediabetes: Secondary | ICD-10-CM | POA: Diagnosis not present

## 2022-03-08 DIAGNOSIS — E785 Hyperlipidemia, unspecified: Secondary | ICD-10-CM | POA: Diagnosis not present

## 2022-03-08 LAB — HEMOGLOBIN A1C: Hgb A1c MFr Bld: 6.3 % (ref 4.6–6.5)

## 2022-03-08 LAB — LDL CHOLESTEROL, DIRECT: Direct LDL: 174 mg/dL

## 2022-03-08 MED ORDER — PRAVASTATIN SODIUM 10 MG PO TABS: 10.0000 mg | ORAL_TABLET | Freq: Every day | ORAL | 1 refills | Status: DC

## 2022-03-08 NOTE — Telephone Encounter (Signed)
Pt need a refill on thyroid medication sent to [facility]

## 2022-03-08 NOTE — Telephone Encounter (Signed)
I called the patient and LVM informing her to call her pharmacy because the refill was sent last week.  Secilia Apps,cma

## 2022-03-09 ENCOUNTER — Other Ambulatory Visit: Payer: Self-pay | Admitting: Family Medicine

## 2022-03-09 ENCOUNTER — Telehealth: Payer: Self-pay

## 2022-03-09 DIAGNOSIS — E039 Hypothyroidism, unspecified: Secondary | ICD-10-CM

## 2022-03-09 NOTE — Telephone Encounter (Signed)
Lm for pt to cb re: needs lab appt 6wks

## 2022-03-11 ENCOUNTER — Other Ambulatory Visit: Payer: Self-pay | Admitting: Family Medicine

## 2022-03-11 DIAGNOSIS — E039 Hypothyroidism, unspecified: Secondary | ICD-10-CM

## 2022-03-11 NOTE — Telephone Encounter (Signed)
Prescription Request  03/11/2022  Is this a "Controlled Substance" medicine? No  LOV: 02/22/2022  What is the name of the medication or equipment? thyroid (NP THYROID) 30 MG tablet  Have you contacted your pharmacy to request a refill? Yes   Which pharmacy would you like this sent to?   [place], [place] - [address] [place] [address] Phone: [phone] Fax: [phone]      Patient notified that their request is being sent to the clinical staff for review and that they should receive a response within 2 business days.   Please advise at [facility] [phone]

## 2022-03-12 ENCOUNTER — Telehealth: Payer: Self-pay

## 2022-03-12 NOTE — Telephone Encounter (Signed)
LM for pt to cb   Leone Haven, MD  [facility] Noted. She needs to follow-up in a few months to recheck her cholesterol.

## 2022-03-25 ENCOUNTER — Other Ambulatory Visit: Payer: Self-pay | Admitting: *Deleted

## 2022-03-25 DIAGNOSIS — Z79899 Other long term (current) drug therapy: Secondary | ICD-10-CM

## 2022-03-25 DIAGNOSIS — Z111 Encounter for screening for respiratory tuberculosis: Secondary | ICD-10-CM

## 2022-03-26 NOTE — Progress Notes (Signed)
CBC and CMP WNL

## 2022-03-27 LAB — CBC WITH DIFFERENTIAL/PLATELET
Absolute Monocytes: 558 cells/uL (ref 200–950)
Basophils Absolute: 50 cells/uL (ref 0–200)
Basophils Relative: 0.8 %
Eosinophils Absolute: 198 cells/uL (ref 15–500)
Eosinophils Relative: 3.2 %
HCT: 40.8 % (ref 35.0–45.0)
Hemoglobin: 13.8 g/dL (ref 11.7–15.5)
Lymphs Abs: 2009 cells/uL (ref 850–3900)
MCH: 30.9 pg (ref 27.0–33.0)
MCHC: 33.8 g/dL (ref 32.0–36.0)
MCV: 91.3 fL (ref 80.0–100.0)
MPV: 11.7 fL (ref 7.5–12.5)
Monocytes Relative: 9 %
Neutro Abs: 3385 cells/uL (ref 1500–7800)
Neutrophils Relative %: 54.6 %
Platelets: 242 10*3/uL (ref 140–400)
RBC: 4.47 10*6/uL (ref 3.80–5.10)
RDW: 13.1 % (ref 11.0–15.0)
Total Lymphocyte: 32.4 %
WBC: 6.2 10*3/uL (ref 3.8–10.8)

## 2022-03-27 LAB — COMPLETE METABOLIC PANEL WITH GFR
AG Ratio: 1.8 (calc) (ref 1.0–2.5)
ALT: 13 U/L (ref 6–29)
AST: 16 U/L (ref 10–35)
Albumin: 4.3 g/dL (ref 3.6–5.1)
Alkaline phosphatase (APISO): 88 U/L (ref 37–153)
BUN: 15 mg/dL (ref 7–25)
CO2: 27 mmol/L (ref 20–32)
Calcium: 9.4 mg/dL (ref 8.6–10.4)
Chloride: 104 mmol/L (ref 98–110)
Creat: 0.76 mg/dL (ref 0.50–1.03)
Globulin: 2.4 g/dL (calc) (ref 1.9–3.7)
Glucose, Bld: 90 mg/dL (ref 65–99)
Potassium: 4.1 mmol/L (ref 3.5–5.3)
Sodium: 140 mmol/L (ref 135–146)
Total Bilirubin: 0.3 mg/dL (ref 0.2–1.2)
Total Protein: 6.7 g/dL (ref 6.1–8.1)
eGFR: 91 mL/min/{1.73_m2} (ref >=60)

## 2022-03-27 LAB — QUANTIFERON-TB GOLD PLUS
Mitogen-NIL: >10 IU/mL
NIL: 0.05 IU/mL
QuantiFERON-TB Gold Plus: NEGATIVE
TB1-NIL: 0.01 IU/mL
TB2-NIL: 0.04 IU/mL

## 2022-03-29 NOTE — Progress Notes (Signed)
TB gold negative

## 2022-04-07 HISTORY — PX: ROOT CANAL: SHX2363

## 2022-05-06 ENCOUNTER — Other Ambulatory Visit: Payer: Self-pay | Admitting: Rheumatology

## 2022-05-06 DIAGNOSIS — Z79899 Other long term (current) drug therapy: Secondary | ICD-10-CM

## 2022-05-06 DIAGNOSIS — L4050 Arthropathic psoriasis, unspecified: Secondary | ICD-10-CM

## 2022-05-06 DIAGNOSIS — L409 Psoriasis, unspecified: Secondary | ICD-10-CM

## 2022-05-06 NOTE — Telephone Encounter (Signed)
Last Fill: 01/18/2022  Labs: 03/25/2022 CBC and CMP WNL   TB Gold: 03/25/2022 Neg    Next Visit: 07/08/2022  Last Visit: 02/04/2022  [id number] arthritis   Current Dose per office note 02/04/2022: Taltz 80 mg subcutaneous injections every 4 weeks   Okay to refill Taltz?

## 2022-05-08 ENCOUNTER — Other Ambulatory Visit: Payer: Self-pay | Admitting: Family Medicine

## 2022-05-08 DIAGNOSIS — R1310 Dysphagia, unspecified: Secondary | ICD-10-CM

## 2022-05-10 NOTE — Telephone Encounter (Signed)
Sent mychart message

## 2022-06-14 ENCOUNTER — Other Ambulatory Visit (INDEPENDENT_AMBULATORY_CARE_PROVIDER_SITE_OTHER): Payer: Commercial Managed Care - PPO

## 2022-06-14 DIAGNOSIS — E785 Hyperlipidemia, unspecified: Secondary | ICD-10-CM

## 2022-06-14 LAB — HEPATIC FUNCTION PANEL
ALT: 9 U/L (ref 0–35)
AST: 12 U/L (ref 0–37)
Albumin: 3.9 g/dL (ref 3.5–5.2)
Alkaline Phosphatase: 77 U/L (ref 39–117)
Bilirubin, Direct: 0.1 mg/dL (ref 0.0–0.3)
Total Bilirubin: 0.4 mg/dL (ref 0.2–1.2)
Total Protein: 6.4 g/dL (ref 6.0–8.3)

## 2022-06-14 LAB — LDL CHOLESTEROL, DIRECT: Direct LDL: 183 mg/dL

## 2022-06-15 ENCOUNTER — Ambulatory Visit (INDEPENDENT_AMBULATORY_CARE_PROVIDER_SITE_OTHER): Payer: Commercial Managed Care - PPO | Admitting: Family Medicine

## 2022-06-15 ENCOUNTER — Encounter: Payer: Self-pay | Admitting: Family Medicine

## 2022-06-15 VITALS — BP 124/76 | HR 62 | Temp 97.9°F | Ht 64.0 in | Wt 163.6 lb

## 2022-06-15 DIAGNOSIS — E785 Hyperlipidemia, unspecified: Secondary | ICD-10-CM

## 2022-06-15 NOTE — Progress Notes (Signed)
Marikay Alar, MD Phone: [phone]  Erica Woods is a 57 y.o. female who presents today for f/u.  HYPERLIPIDEMIA Patient notes she is hesitant to start on a statin given the risk of liver issues while being on a statin.  She did have myalgias while on Crestor.  She has not started the pravastatin that was sent in previously for her. Lipid Panel     Component Value Date/Time   CHOL 305 (H) 11/24/2021 0932   TRIG 179.0 (H) 11/24/2021 0932   HDL 71.80 11/24/2021 0932   CHOLHDL 4 11/24/2021 0932   VLDL 35.8 11/24/2021 0932   LDLCALC 197 (H) 11/24/2021 0932   LDLDIRECT 183.0 06/14/2022 0915   The 10-year ASCVD risk score (Arnett DK, et al., 2019) is: 3%   Values used to calculate the score:     Age: 52 years     Sex: Female     Is Non-Hispanic African American: No     Diabetic: No     Tobacco smoker: No     Systolic Blood Pressure: 124 mmHg     Is BP treated: No     HDL Cholesterol: 71.8 mg/dL     Total Cholesterol: 305 mg/dL   Social History   Tobacco Use  Smoking Status Never   Passive exposure: Never  Smokeless Tobacco Never    Current Outpatient Medications on File Prior to Visit  Medication Sig Dispense Refill   aspirin-acetaminophen-caffeine (EXCEDRIN MIGRAINE) 250-250-65 MG tablet Take 1 tablet by mouth every 6 (six) hours as needed for headache. 30 tablet 0   Cyanocobalamin (VITAMIN B-12 PO) Take by mouth daily.     esomeprazole (NEXIUM) 20 MG capsule Take 1 capsule (20 mg total) by mouth daily at 12 noon. 90 capsule 3   meloxicam (MOBIC) 15 MG tablet Take 1 tablet (15 mg total) by mouth daily. 30 tablet 1   pravastatin (PRAVACHOL) 10 MG tablet Take 1 tablet (10 mg total) by mouth daily. 90 tablet 1   TALTZ 80 MG/ML SOAJ INJECT 80MG  SUBCUTANEOUSLY EVERY 4 WEEKS 1 mL 2   thyroid (NP THYROID) 30 MG tablet TAKE 1 & 1/2 (ONE & ONE-HALF) TABLETS BY MOUTH ONCE DAILY BEFORE BREAKFAST 135 tablet 1   triamcinolone cream (KENALOG) 0.1 % Apply to affected area twice  daily as needed. 30 g 0   VITAMIN D PO Take by mouth daily.     Current Facility-Administered Medications on File Prior to Visit  Medication Dose Route Frequency Provider Last Rate Last Admin   cyanocobalamin ((VITAMIN B-12)) injection 1,000 mcg  1,000 mcg Intramuscular Once Glori Luis, MD         ROS see history of present illness  Objective  Physical Exam Vitals:   06/15/22 0918  BP: 124/76  Pulse: 62  Temp: 97.9 F (36.6 C)  SpO2: 95%    BP Readings from Last 3 Encounters:  06/15/22 124/76  02/22/22 120/80  02/11/22 123/77   Wt Readings from Last 3 Encounters:  06/15/22 163 lb 9.6 oz (74.2 kg)  02/22/22 167 lb 3.2 oz (75.8 kg)  02/11/22 169 lb (76.7 kg)    Physical Exam Constitutional:      General: She is not in acute distress. Pulmonary:     Effort: Pulmonary effort is normal.  Neurological:     Mental Status: She is alert.      Assessment/Plan: Please see individual problem list.  Hyperlipidemia, unspecified hyperlipidemia type Assessment & Plan: Chronic issue.  Discussed with the patient  that she should be on a statin given her LDL late last year was above 190.  Discussed risk of stroke and heart attack with poorly controlled cholesterol.  Discussed that we typically do not see any permanent liver damage from statins and if we do see anything occur with the liver it is the liver enzymes going up slightly.  Discussed that those typically returned back to normal once we stop the statin.  We briefly discussed trying Repatha or Praluent if the patient was not willing to go on a statin.  Discussed I did not know what insurance coverage would be for those medications for her.  Patient opted to trial pravastatin 10 mg daily and she will start on the supply of this that she has at home.  We will recheck labs in 6 weeks.  If she notices any side effects with going on the pravastatin she will discontinue it and let us know.  If her liver enzymes go up we will  stop her statin and pursue Repatha or Praluent.  Orders: -     LDL cholesterol, direct; Future -     Hepatic function panel; Future    Return in about 6 weeks (around 07/27/2022) for labs, as scheduled for PCP f/u.   Marikay Alar, MD [facility]

## 2022-06-15 NOTE — Assessment & Plan Note (Signed)
Chronic issue.  Discussed with the patient that she should be on a statin given her LDL late last year was above 190.  Discussed risk of stroke and heart attack with poorly controlled cholesterol.  Discussed that we typically do not see any permanent liver damage from statins and if we do see anything occur with the liver it is the liver enzymes going up slightly.  Discussed that those typically returned back to normal once we stop the statin.  We briefly discussed trying Repatha or Praluent if the patient was not willing to go on a statin.  Discussed I did not know what insurance coverage would be for those medications for her.  Patient opted to trial pravastatin 10 mg daily and she will start on the supply of this that she has at home.  We will recheck labs in 6 weeks.  If she notices any side effects with going on the pravastatin she will discontinue it and let us know.  If her liver enzymes go up we will stop her statin and pursue Repatha or Praluent.

## 2022-06-22 ENCOUNTER — Other Ambulatory Visit: Payer: Self-pay | Admitting: *Deleted

## 2022-06-22 MED ORDER — TRIAMCINOLONE ACETONIDE 0.1 % EX CREA: TOPICAL_CREAM | CUTANEOUS | 0 refills | Status: AC

## 2022-06-22 NOTE — Telephone Encounter (Signed)
Last Fill: 07/06/2021  Next Visit: 07/07/2022  Last Visit: 02/04/2022  Dx: Psoriasis   Current Dose per office note on 02/04/2022: triamcinolone cream which she can apply topically as needed   Okay to refill triamcinolone cream?

## 2022-06-22 NOTE — Telephone Encounter (Signed)
From: Waynette Buttery To: Office of Gearldine Bienen[place], [place] Sent: 06/22/2022 8:52 AM EDT Subject: Medication Renewal Request  Refills have been requested for the following medications:   triamcinolone cream (KENALOG) 0.1 % [place]]  Preferred pharmacy: [facility] [address] [place], [place] - [address] Delivery method: Daryll Drown

## 2022-06-23 ENCOUNTER — Other Ambulatory Visit: Payer: Self-pay | Admitting: *Deleted

## 2022-06-23 DIAGNOSIS — Z79899 Other long term (current) drug therapy: Secondary | ICD-10-CM

## 2022-06-23 LAB — CBC WITH DIFFERENTIAL/PLATELET
Hemoglobin: 13.4 g/dL (ref 11.7–15.5)
Lymphs Abs: 2172 cells/uL (ref 850–3900)

## 2022-06-24 LAB — CBC WITH DIFFERENTIAL/PLATELET
Absolute Monocytes: 494 cells/uL (ref 200–950)
Basophils Absolute: 61 cells/uL (ref 0–200)
Basophils Relative: 1 %
Eosinophils Absolute: 122 cells/uL (ref 15–500)
Eosinophils Relative: 2 %
HCT: 39.6 % (ref 35.0–45.0)
MCH: 30.5 pg (ref 27.0–33.0)
MCHC: 33.8 g/dL (ref 32.0–36.0)
MCV: 90 fL (ref 80.0–100.0)
MPV: 11.7 fL (ref 7.5–12.5)
Monocytes Relative: 8.1 %
Neutro Abs: 3251 cells/uL (ref 1500–7800)
Neutrophils Relative %: 53.3 %
Platelets: 239 10*3/uL (ref 140–400)
RBC: 4.4 10*6/uL (ref 3.80–5.10)
RDW: 13.8 % (ref 11.0–15.0)
Total Lymphocyte: 35.6 %
WBC: 6.1 10*3/uL (ref 3.8–10.8)

## 2022-06-24 LAB — COMPLETE METABOLIC PANEL WITH GFR
AG Ratio: 1.6 (calc) (ref 1.0–2.5)
ALT: 11 U/L (ref 6–29)
AST: 12 U/L (ref 10–35)
Albumin: 4.1 g/dL (ref 3.6–5.1)
Alkaline phosphatase (APISO): 81 U/L (ref 37–153)
BUN: 11 mg/dL (ref 7–25)
CO2: 28 mmol/L (ref 20–32)
Calcium: 9.5 mg/dL (ref 8.6–10.4)
Chloride: 105 mmol/L (ref 98–110)
Creat: 0.71 mg/dL (ref 0.50–1.03)
Globulin: 2.6 g/dL (calc) (ref 1.9–3.7)
Glucose, Bld: 146 mg/dL — ABNORMAL HIGH (ref 65–99)
Potassium: 4.5 mmol/L (ref 3.5–5.3)
Sodium: 142 mmol/L (ref 135–146)
Total Bilirubin: 0.3 mg/dL (ref 0.2–1.2)
Total Protein: 6.7 g/dL (ref 6.1–8.1)
eGFR: 98 mL/min/{1.73_m2} (ref >=60)

## 2022-06-24 NOTE — Progress Notes (Signed)
Office Visit Note  Patient: Erica Woods             Date of Birth: 1965/02/23           MRN: [id number]             PCP: Glori Luis, MD Referring: Glori Luis, MD Visit Date: 07/08/2022 Occupation: @GUAROCC @  Subjective:  Medication management  History of Present Illness: Erica Woods is a 57 y.o. female with history of psoriatic arthritis and psoriasis.  She was switched from Tremfya to Taltz 80 mg subcu every 4 weeks in January 2024.  She states gradually her joint pain and symptoms have improved.  She denies any morning stiffness.  She has not noticed any joint swelling.  She takes meloxicam only on the as needed basis.  She denies any history of uveitis, Planter fasciitis or Achilles tendinitis.  She states her lower back pain has improved since she has been doing stretching exercises.  She denies any SI joint pain today.  She states her psoriasis patches are gradually clearing up.  She only has dry skin now for which she has been using topical agent.  She had a root canal procedure on Jul 06, 2022.  She is holding Runner, broadcasting/film/video for now.  She is also on amoxicillin as prophylactic antibiotic.  She denies any infection from the procedure.    Activities of Daily Living:  Patient reports morning stiffness for 0 minute.   Patient Denies nocturnal pain.  Difficulty dressing/grooming: Denies Difficulty climbing stairs: Denies Difficulty getting out of chair: Denies Difficulty using hands for taps, buttons, cutlery, and/or writing: Denies  Review of Systems  Constitutional:  Negative for fatigue.  HENT:  Negative for mouth sores and mouth dryness.   Eyes:  Negative for dryness.  Respiratory:  Negative for shortness of breath.   Cardiovascular:  Negative for chest pain and palpitations.  Gastrointestinal:  Negative for blood in stool, constipation and diarrhea.  Endocrine: Negative for increased urination.  Genitourinary:  Negative for involuntary urination.   Musculoskeletal:  Positive for joint pain and joint pain. Negative for gait problem, joint swelling, myalgias, muscle weakness, morning stiffness, muscle tenderness and myalgias.  Skin:  Negative for color change, rash, hair loss and sensitivity to sunlight.  Allergic/Immunologic: Negative for susceptible to infections.  Neurological:  Negative for dizziness and headaches.  Hematological:  Negative for swollen glands.  Psychiatric/Behavioral:  Negative for depressed mood and sleep disturbance. The patient is not nervous/anxious.     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   Vitamin D deficiency 05/25/2021   Postmenopausal bleeding 11/24/2020   Rash 03/26/2020   Prediabetes 09/20/2018   Stress 11/14/2017   Hot flashes 07/29/2017   Hyperlipidemia 07/29/2017   Hypothyroidism 11/08/2016   Muscle cramps 11/08/2016   Plantar fasciitis, left 07/05/2016   Psoriasis 07/05/2016   Arthralgia of both hands 05/04/2016   History of breast cancer 11/19/2015   Routine general medical examination at a health care facility 05/02/2015   Migraines 11/25/2014   GERD (gastroesophageal reflux disease) 11/25/2014   Atypical lobular hyperplasia [facility]) of left breast 11/22/2014    Past Medical History:  Diagnosis Date   Allergic rhinitis    Breast cancer (HCC)    Chickenpox    GERD (gastroesophageal reflux disease)    Headache    Hemorrhoids    History of blood transfusion    Hypercholesteremia  Hyperthyroidism     Family History  Problem Relation Age of Onset   Diabetes Mother    Heart attack Father    Hypertension Other    Diabetes Other    Diabetes Sister    Cancer Sister    Tuberculosis Brother    Irregular heart beat Brother    Parkinson's disease Sister    Healthy Son    Irregular heart beat Son    Healthy Son    Past Surgical History:  Procedure Laterality Date   ABDOMINAL SURGERY     APPENDECTOMY     BALLOON  DILATION N/A 02/04/2017   Procedure: [place];  Surgeon: Wyline Mood, MD;  Location: [facility] ENDOSCOPY;  Service: Gastroenterology;  Laterality: N/A;   BREAST BIOPSY Left 10/02/2014   Procedure: LEFT BREAST BIOPSY AFTER NEEDLE LOCALIZATION X TWO;  Surgeon: Franky Macho, MD;  Location: [facility] ORS;  Service: General;  Laterality: Left;   BREAST LUMPECTOMY Left 2016   benign   COLONOSCOPY N/A 10/23/2014   Procedure: COLONOSCOPY;  Surgeon: Malissa Hippo, MD;  Location: [facility] ENDO SUITE;  Service: Endoscopy;  Laterality: N/A;  730   ESOPHAGOGASTRODUODENOSCOPY (EGD) WITH PROPOFOL N/A 02/04/2017   Procedure: ESOPHAGOGASTRODUODENOSCOPY (EGD) WITH PROPOFOL;  Surgeon: Wyline Mood, MD;  Location: [facility] ENDOSCOPY;  Service: Gastroenterology;  Laterality: N/A;   ESOPHAGOGASTRODUODENOSCOPY (EGD) WITH PROPOFOL N/A 09/17/2021   Procedure: ESOPHAGOGASTRODUODENOSCOPY (EGD) WITH PROPOFOL;  Surgeon: Wyline Mood, MD;  Location: [facility] ENDOSCOPY;  Service: Endoscopy;  Laterality: N/A;   LAPAROSCOPIC APPENDECTOMY  03/02/2012   Procedure: APPENDECTOMY LAPAROSCOPIC;  Surgeon: Fabio Bering, MD;  Location: [facility] ORS;  Service: General;  Laterality: N/A;   OVARIAN CYST REMOVAL Left    ROOT CANAL  04/07/2022   TUBAL LIGATION     WISDOM TOOTH EXTRACTION Bilateral    Social History   Social History Narrative   Not on file   Immunization History  Administered Date(s) Administered   Influenza,inj,Quad PF,6+ Mos 11/14/2017, 11/24/2020, 12/10/2021   Influenza-Unspecified 11/17/2015, 12/12/2018, 12/03/2019   Moderna Sars-Covid-2 Vaccination 06/05/2019, 07/03/2019, 01/18/2020, 07/04/2020   PNEUMOCOCCAL CONJUGATE-20 11/24/2020   Tdap 05/02/2015   Zoster Recombinat (Shingrix) 05/09/2017, 07/12/2017     Objective: Vital Signs: BP 113/72 (BP Location: Left Arm, Patient Position: Sitting, Cuff Size: Normal)   Pulse 67   Resp 12   Ht 5\' 4"  (1.626 m)   Wt 161 lb (73 kg)   LMP 09/17/2014   BMI 27.64 kg/m    Physical  Exam Vitals and nursing note reviewed.  Constitutional:      Appearance: She is well-developed.  HENT:     Head: Normocephalic and atraumatic.  Eyes:     Conjunctiva/sclera: Conjunctivae normal.  Cardiovascular:     Rate and Rhythm: Normal rate and regular rhythm.     Heart sounds: Normal heart sounds.  Pulmonary:     Effort: Pulmonary effort is normal.     Breath sounds: Normal breath sounds.  Abdominal:     General: Bowel sounds are normal.     Palpations: Abdomen is soft.  Musculoskeletal:     Cervical back: Normal range of motion.  Lymphadenopathy:     Cervical: No cervical adenopathy.  Skin:    General: Skin is warm and dry.     Capillary Refill: Capillary refill takes less than 2 seconds.     Comments: Dry skin is noted over elbows and wrists.  Neurological:     Mental Status: She is alert and oriented to person, place, and time.  Psychiatric:        Behavior: Behavior normal.      Musculoskeletal Exam: Cervical, thoracic and lumbar spine were in good range of motion.  She had no SI joint tenderness on the examination today.  Shoulder joints, elbow joints, wrist joints, MCPs PIPs and DIPs with good range of motion with no synovitis.  Hip joints and knee joints are in good range of motion without any warmth swelling or effusion.  There was no tenderness over ankles or MTPs.  There was no tenderness over Achilles tendon or plantar fascia.  There was no trochanteric bursa tenderness.  CDAI Exam: CDAI Score: -- Patient Global: --; Provider Global: -- Swollen: --; Tender: -- Joint Exam 07/08/2022   No joint exam has been documented for this visit   There is currently no information documented on the homunculus. Go to the Rheumatology activity and complete the homunculus joint exam.  Investigation: No additional findings.  Imaging: No results found.  Recent Labs: Lab Results  Component Value Date   WBC 6.1 06/23/2022   HGB 13.4 06/23/2022   PLT 239 06/23/2022    NA 142 06/23/2022   K 4.5 06/23/2022   CL 105 06/23/2022   CO2 28 06/23/2022   GLUCOSE 146 (H) 06/23/2022   BUN 11 06/23/2022   CREATININE 0.71 06/23/2022   BILITOT 0.3 06/23/2022   ALKPHOS 77 06/14/2022   AST 12 06/23/2022   ALT 11 06/23/2022   PROT 6.7 06/23/2022   ALBUMIN 3.9 06/14/2022   CALCIUM 9.5 06/23/2022   GFRAA 115 06/16/2020   QFTBGOLDPLUS NEGATIVE 03/25/2022    Speciality Comments: MTX tabs 04/14/20-07/26/20 (stopped d/t nausea from folic acid); switched to Tremfya 08/04/21 Taltz 02/24/21  Procedures:  No procedures performed Allergies: Molnupiravir and Gold-containing drug products   Assessment / Plan:     Visit Diagnoses: Psoriatic arthritis (HCC)-patient has been doing well on Taltz 80 mg subcu every 4 weeks.  She has not had any episodes of joint inflammation or swelling.  She denies any morning stiffness.  She is currently holding Taltz due to root canal procedure last week.  She is on amoxicillin.  She plans to restart Taltz in a week after the procedure.  She denies any history of SI joint pain since she has been doing his stretching exercises.  She denies Achilles tendinitis, Planter fasciitis.  Psoriasis-she has noticed improvement in the psoriasis.  She had some dry scales on her elbows and her wrist for which she has been using topical agents.  High risk medication use - Altamease Oiler was initiated on 02/24/2021.  She is currently on Taltz 80 mg subcutaneous injections every 4 weeks.  Previous therapy: MTX and tremfya.  May 2024 CBC and CMP were normal.  TB Gold was negative on March 25, 2022.  She was advised to get labs every 3 months.  She will get TB Gold annually.  Information on immunization was placed in the AVS.  She was advised to hold Taltz if she develops an infection resume after the infection resolves.  Primary osteoarthritis of both hands-she has mild osteoarthritic changes.  She is currently not having any discomfort.  Joint protection was  discussed.  Chronic right SI joint pain-improved to doing stretching exercises.  She had no SI joint tenderness today.  Plantar fasciitis, bilateral-she denies any recurrence.  She had no tenderness on the palpation today.  Trochanteric bursitis of both hips - Resolved.  She was advised to continue stretching exercises.  Primary osteoarthritis of both feet -  Under the care of Dr. Logan Bores.  Arthropathy of lumbar facet joint-she had good mobility in her lumbar spine without discomfort.  Osteopenia of multiple sites-February 11, 2022 DEXA scan T-score -1.8, BMD 0.795 left femoral neck.  DEXA scan results were reviewed with the patient.  Use of calcium rich diet and vitamin D was advised.  Regular exercise was also advised.  Patient has been walking for exercise.  Resistive exercises were discussed.  Vitamin D deficiency-she is taking vitamin D supplement.  Her vitamin D was 38.84 on November 24, 2021.  Other medical problems are listed as follows:  Prediabetes  Lobular carcinoma in situ (LCIS) of left breast  History of gastroesophageal reflux (GERD)  History of hyperlipidemia-she is on pravastatin 10 mg p.o. daily.  Increased risk of heart disease with psoriatic arthritis was discussed.  Her BMI is 27.64.  Need for regular exercise send dietary modifications were discussed.  History of hypothyroidism  Hx of migraines    Orders: No orders of the defined types were placed in this encounter.  No orders of the defined types were placed in this encounter.    Follow-Up Instructions: Return in about 5 months (around 12/08/2022) for Psoriatic arthritis.   Pollyann Savoy, MD  Note - This record has been created using Animal nutritionist.  Chart creation errors have been sought, but may not always  have been located. Such creation errors do not reflect on  the standard of medical care.

## 2022-07-08 ENCOUNTER — Ambulatory Visit: Payer: Commercial Managed Care - PPO | Attending: Rheumatology | Admitting: Rheumatology

## 2022-07-08 ENCOUNTER — Encounter: Payer: Self-pay | Admitting: Rheumatology

## 2022-07-08 VITALS — BP 113/72 | HR 67 | Resp 12 | Ht 64.0 in | Wt 161.0 lb

## 2022-07-08 DIAGNOSIS — Z8719 Personal history of other diseases of the digestive system: Secondary | ICD-10-CM

## 2022-07-08 DIAGNOSIS — M7062 Trochanteric bursitis, left hip: Secondary | ICD-10-CM

## 2022-07-08 DIAGNOSIS — L409 Psoriasis, unspecified: Secondary | ICD-10-CM

## 2022-07-08 DIAGNOSIS — G8929 Other chronic pain: Secondary | ICD-10-CM

## 2022-07-08 DIAGNOSIS — M47816 Spondylosis without myelopathy or radiculopathy, lumbar region: Secondary | ICD-10-CM

## 2022-07-08 DIAGNOSIS — R7303 Prediabetes: Secondary | ICD-10-CM

## 2022-07-08 DIAGNOSIS — L4050 Arthropathic psoriasis, unspecified: Secondary | ICD-10-CM

## 2022-07-08 DIAGNOSIS — Z79899 Other long term (current) drug therapy: Secondary | ICD-10-CM

## 2022-07-08 DIAGNOSIS — M19041 Primary osteoarthritis, right hand: Secondary | ICD-10-CM | POA: Diagnosis not present

## 2022-07-08 DIAGNOSIS — D0502 Lobular carcinoma in situ of left breast: Secondary | ICD-10-CM

## 2022-07-08 DIAGNOSIS — M722 Plantar fascial fibromatosis: Secondary | ICD-10-CM

## 2022-07-08 DIAGNOSIS — Z8639 Personal history of other endocrine, nutritional and metabolic disease: Secondary | ICD-10-CM

## 2022-07-08 DIAGNOSIS — M533 Sacrococcygeal disorders, not elsewhere classified: Secondary | ICD-10-CM

## 2022-07-08 DIAGNOSIS — M7061 Trochanteric bursitis, right hip: Secondary | ICD-10-CM

## 2022-07-08 DIAGNOSIS — Z8669 Personal history of other diseases of the nervous system and sense organs: Secondary | ICD-10-CM

## 2022-07-08 DIAGNOSIS — E559 Vitamin D deficiency, unspecified: Secondary | ICD-10-CM

## 2022-07-08 DIAGNOSIS — M19042 Primary osteoarthritis, left hand: Secondary | ICD-10-CM

## 2022-07-08 DIAGNOSIS — M8589 Other specified disorders of bone density and structure, multiple sites: Secondary | ICD-10-CM

## 2022-07-08 DIAGNOSIS — M19071 Primary osteoarthritis, right ankle and foot: Secondary | ICD-10-CM

## 2022-07-08 DIAGNOSIS — M19072 Primary osteoarthritis, left ankle and foot: Secondary | ICD-10-CM

## 2022-07-08 NOTE — Patient Instructions (Signed)
Standing Labs We placed an order today for your standing lab work.   Please have your standing labs drawn in August and every 3 months  Please have your labs drawn 2 weeks prior to your appointment so that the provider can discuss your lab results at your appointment, if possible.  Please note that you may see your imaging and lab results in MyChart before we have reviewed them. We will contact you once all results are reviewed. Please allow our office up to 72 hours to thoroughly review all of the results before contacting the office for clarification of your results.  WALK-IN LAB HOURS  Monday through Thursday from 8:00 am -12:30 pm and 1:00 pm-5:00 pm and Friday from 8:00 am-12:00 pm.  Patients with office visits requiring labs will be seen before walk-in labs.  You may encounter longer than normal wait times. Please allow additional time. Wait times may be shorter on  Monday and Thursday afternoons.  We do not book appointments for walk-in labs. We appreciate your patience and understanding with our staff.   Labs are drawn by [facility]. Please bring your co-pay at the time of your lab draw.  You may receive a bill from [facility] for your lab work.  Please note if you are on Hydroxychloroquine and and an order has been placed for a Hydroxychloroquine level,  you will need to have it drawn 4 hours or more after your last dose.  If you wish to have your labs drawn at another location, please call the office 24 hours in advance so we can fax the orders.  The office is located at [address], [address], [place], [place] [address]   If you have any questions regarding directions or hours of operation,  please call [phone].   As a reminder, please drink plenty of water prior to coming for your lab work. Thanks!   Vaccines You are taking a medication(s) that can suppress your immune system.  The following immunizations are recommended: Flu annually Covid-19  Td/Tdap (tetanus,  diphtheria, pertussis) every 10 years Pneumonia (Prevnar 15 then Pneumovax 23 at least 1 year apart.  Alternatively, can take Prevnar 20 without needing additional dose) Shingrix: 2 doses from 4 weeks to 6 months apart  Please check with your PCP to make sure you are up to date.   If you have signs or symptoms of an infection or start antibiotics: First, call your PCP for workup of your infection. Hold your medication through the infection, until you complete your antibiotics, and until symptoms resolve if you take the following: Injectable medication (Actemra, Benlysta, Cimzia, Cosentyx, Enbrel, Humira, Kevzara, Orencia, Remicade, Simponi, Stelara, Taltz, Tremfya) Methotrexate Leflunomide (Arava) Mycophenolate (Cellcept) Xeljanz, Olumiant, or Rinvoq  

## 2022-07-27 ENCOUNTER — Other Ambulatory Visit (INDEPENDENT_AMBULATORY_CARE_PROVIDER_SITE_OTHER): Payer: Commercial Managed Care - PPO

## 2022-07-27 DIAGNOSIS — E785 Hyperlipidemia, unspecified: Secondary | ICD-10-CM | POA: Diagnosis not present

## 2022-07-27 DIAGNOSIS — Z79899 Other long term (current) drug therapy: Secondary | ICD-10-CM

## 2022-07-27 LAB — HEPATIC FUNCTION PANEL
ALT: 9 U/L (ref 0–35)
AST: 15 U/L (ref 0–37)
Albumin: 4.3 g/dL (ref 3.5–5.2)
Alkaline Phosphatase: 71 U/L (ref 39–117)
Bilirubin, Direct: 0 mg/dL (ref 0.0–0.3)
Total Bilirubin: 0.3 mg/dL (ref 0.2–1.2)
Total Protein: 7.3 g/dL (ref 6.0–8.3)

## 2022-07-27 LAB — LDL CHOLESTEROL, DIRECT: Direct LDL: 140 mg/dL

## 2022-07-27 NOTE — Addendum Note (Signed)
Addended by: Jarvis Morgan D on: 07/27/2022 11:30 AM   Modules accepted: Orders

## 2022-07-27 NOTE — Addendum Note (Signed)
Addended by: Jarvis Morgan D on: 07/27/2022 11:26 AM   Modules accepted: Orders

## 2022-07-30 ENCOUNTER — Other Ambulatory Visit: Payer: Self-pay | Admitting: Physician Assistant

## 2022-07-30 DIAGNOSIS — L4050 Arthropathic psoriasis, unspecified: Secondary | ICD-10-CM

## 2022-07-30 DIAGNOSIS — Z79899 Other long term (current) drug therapy: Secondary | ICD-10-CM

## 2022-07-30 DIAGNOSIS — L409 Psoriasis, unspecified: Secondary | ICD-10-CM

## 2022-07-30 NOTE — Telephone Encounter (Signed)
Last Fill: 05/06/2022  Labs: 06/23/2022 Glucose is 146. Rest of CMP WNL. CBC WNL   TB Gold: 03/25/2022 Neg    Next Visit: 12/08/2022  Last Visit: 07/08/2022  RU:[id number] arthritis   Current Dose per office note 07/08/2022: Taltz 80 mg subcu every 4 weeks   Okay to refill Taltz?

## 2022-08-25 ENCOUNTER — Other Ambulatory Visit: Payer: Self-pay | Admitting: Family Medicine

## 2022-08-25 DIAGNOSIS — Z1231 Encounter for screening mammogram for malignant neoplasm of breast: Secondary | ICD-10-CM

## 2022-09-09 ENCOUNTER — Ambulatory Visit
Admission: RE | Admit: 2022-09-09 | Discharge: 2022-09-09 | Disposition: A | Discharge status: Home / Self Care | Payer: Commercial Managed Care - PPO | Source: Ambulatory Visit | Attending: Family Medicine | Admitting: Family Medicine

## 2022-09-09 DIAGNOSIS — Z1231 Encounter for screening mammogram for malignant neoplasm of breast: Secondary | ICD-10-CM

## 2022-09-27 ENCOUNTER — Other Ambulatory Visit: Payer: Self-pay | Admitting: *Deleted

## 2022-09-27 DIAGNOSIS — E785 Hyperlipidemia, unspecified: Secondary | ICD-10-CM

## 2022-09-27 DIAGNOSIS — Z79899 Other long term (current) drug therapy: Secondary | ICD-10-CM

## 2022-09-27 LAB — CBC WITH DIFFERENTIAL/PLATELET
Absolute Monocytes: 546 cells/uL (ref 200–950)
Basophils Absolute: 48 cells/uL (ref 0–200)
Basophils Relative: 0.8 %
Eosinophils Absolute: 174 cells/uL (ref 15–500)
Eosinophils Relative: 2.9 %
HCT: 42.3 % (ref 35.0–45.0)
Hemoglobin: 14.1 g/dL (ref 11.7–15.5)
Lymphs Abs: 2106 cells/uL (ref 850–3900)
MCH: 30.6 pg (ref 27.0–33.0)
MCHC: 33.3 g/dL (ref 32.0–36.0)
MCV: 91.8 fL (ref 80.0–100.0)
MPV: 11.2 fL (ref 7.5–12.5)
Monocytes Relative: 9.1 %
Neutro Abs: 3126 cells/uL (ref 1500–7800)
Neutrophils Relative %: 52.1 %
Platelets: 254 10*3/uL (ref 140–400)
RBC: 4.61 10*6/uL (ref 3.80–5.10)
RDW: 12.8 % (ref 11.0–15.0)
Total Lymphocyte: 35.1 %
WBC: 6 10*3/uL (ref 3.8–10.8)

## 2022-10-23 ENCOUNTER — Other Ambulatory Visit: Payer: Self-pay | Admitting: Physician Assistant

## 2022-10-23 DIAGNOSIS — L4050 Arthropathic psoriasis, unspecified: Secondary | ICD-10-CM

## 2022-10-23 DIAGNOSIS — Z79899 Other long term (current) drug therapy: Secondary | ICD-10-CM

## 2022-10-23 DIAGNOSIS — L409 Psoriasis, unspecified: Secondary | ICD-10-CM

## 2022-10-25 NOTE — Telephone Encounter (Signed)
Last Fill: 07/30/2022  Labs: 09/27/2022 CBC and CMP WNL   TB Gold: 03/25/2022 Neg   Next Visit: 12/08/2022  Last Visit: 07/08/2022  DX: Psoriatic arthritis   Current Dose per office note 07/08/2022: Taltz 80 mg subcu every 4 weeks   Okay to refill Taltz?

## 2022-11-11 ENCOUNTER — Telehealth: Payer: Self-pay | Admitting: Pharmacist

## 2022-11-11 NOTE — Telephone Encounter (Signed)
Received notification from [facility] regarding a prior authorization for TALTZ. Authorization has been APPROVED from 11/11/22 to 11/10/23. Approval letter sent to scan center.  Patient must continue to fill through [facility]: [phone]   EOC # [id number] Phone # [phone]  Chesley Mires, PharmD, MPH, BCPS, CPP Clinical Pharmacist (Rheumatology and Pulmonology)

## 2022-11-11 NOTE — Telephone Encounter (Signed)
Submitted a Prior Authorization renewal request to [id number] for TALTZ via [employer] portal. Will update once we receive a response.  EOC: [id number]

## 2022-11-24 NOTE — Progress Notes (Unsigned)
Office Visit Note  Patient: Erica Woods             Date of Birth: Sep 29, 1965           MRN: [id number]             PCP: Glori Luis, MD Referring: Glori Luis, MD Visit Date: 12/08/2022 Occupation: @GUAROCC @  Subjective:  Right hip and knee pain   History of Present Illness: Erica Woods is a 57 y.o. female with history of psoriatic arthritis and osteoarthritis.  Patient remains on currently on Taltz 80 mg subcutaneous injections every 4 weeks.  She is tolerating Taltz without any side effects or injection site reactions.  Her last dose of Altamease Oiler was administered last Tuesday.  She has not had any interruptions in therapy.  Patient states that with the change in weather she has had a flare of psoriasis.  She has triamcinolone cream at home which she uses as needed and has been trying to keep her hands moisturized.  Patient states for the past 2 months has been experiencing pain on the lateral aspect of her right knee extending up to her right hip.  She has been wearing a knee brace which has been helpful but she continues to have stiffness when rising from a seated position as well as some difficulty climbing steps.  She denies any warmth or swelling in her knee joint.  She denies any injury or fall prior to the onset of symptoms.  She denies any Achilles tendinitis or plantar fasciitis.  She continues to have chronic SI joint pain bilaterally.  She experiences some stiffness in both hands but denies any joint swelling.  She takes meloxicam 15 mg 1 tablet daily as needed for pain relief.  She has been taking meloxicam more sparingly and has taken ibuprofen 600 mg daily for mild to moderate pain relief. She denies any recent or recurrent infections.    Activities of Daily Living:  Patient reports morning stiffness for 2 hours.   Patient Reports nocturnal pain.  Difficulty dressing/grooming: Denies Difficulty climbing stairs: Reports Difficulty getting out of chair:  Reports Difficulty using hands for taps, buttons, cutlery, and/or writing: Denies  Review of Systems  Constitutional:  Negative for fatigue.  HENT:  Negative for mouth sores and mouth dryness.   Eyes:  Positive for itching and dryness. Negative for pain and redness.  Respiratory:  Negative for shortness of breath.   Cardiovascular:  Negative for chest pain and palpitations.  Gastrointestinal:  Negative for blood in stool, constipation and diarrhea.  Endocrine: Negative for increased urination.  Genitourinary:  Negative for involuntary urination.  Musculoskeletal:  Positive for joint pain, gait problem, joint pain, joint swelling, myalgias, morning stiffness, muscle tenderness and myalgias. Negative for muscle weakness.  Skin:  Negative for color change, rash, hair loss and sensitivity to sunlight.  Allergic/Immunologic: Negative for susceptible to infections.  Neurological:  Negative for dizziness and headaches.  Hematological:  Negative for swollen glands.  Psychiatric/Behavioral:  Positive for sleep disturbance. Negative for depressed mood. The patient is not nervous/anxious.     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   Vitamin D deficiency 05/25/2021   Postmenopausal bleeding 11/24/2020   Rash 03/26/2020   Prediabetes 09/20/2018   Stress 11/14/2017   Hot flashes 07/29/2017   Hyperlipidemia 07/29/2017   Hypothyroidism 11/08/2016   Muscle cramps 11/08/2016   Plantar fasciitis, left 07/05/2016  Psoriasis 07/05/2016   Arthralgia of both hands 05/04/2016   History of breast cancer 11/19/2015   Routine general medical examination at a health care facility 05/02/2015   Migraines 11/25/2014   GERD (gastroesophageal reflux disease) 11/25/2014   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 11/22/2014    Past Medical History:  Diagnosis Date   Allergic rhinitis    Breast cancer (HCC)    Chickenpox    GERD  (gastroesophageal reflux disease)    Headache    Hemorrhoids    History of blood transfusion    Hypercholesteremia    Hyperthyroidism     Family History  Problem Relation Age of Onset   Diabetes Mother    Osteoporosis Mother    Heart attack Father    Diabetes Sister    Cancer Sister    Parkinson's disease Sister    Tuberculosis Brother    Irregular heart beat Brother    Healthy Son    Irregular heart beat Son    Healthy Son    Hypertension Other    Diabetes Other    Past Surgical History:  Procedure Laterality Date   ABDOMINAL SURGERY     APPENDECTOMY     BALLOON DILATION N/A 02/04/2017   Procedure: Marvis Repress DILATION;  Surgeon: Wyline Mood, MD;  Location: [facility] ENDOSCOPY;  Service: Gastroenterology;  Laterality: N/A;   BREAST BIOPSY Left 10/02/2014   Procedure: LEFT BREAST BIOPSY AFTER NEEDLE LOCALIZATION X TWO;  Surgeon: Franky Macho, MD;  Location: [facility] ORS;  Service: General;  Laterality: Left;   BREAST LUMPECTOMY Left 2016   benign   COLONOSCOPY N/A 10/23/2014   Procedure: COLONOSCOPY;  Surgeon: Malissa Hippo, MD;  Location: [facility] ENDO SUITE;  Service: Endoscopy;  Laterality: N/A;  730   ESOPHAGOGASTRODUODENOSCOPY (EGD) WITH PROPOFOL N/A 02/04/2017   Procedure: ESOPHAGOGASTRODUODENOSCOPY (EGD) WITH PROPOFOL;  Surgeon: Wyline Mood, MD;  Location: [facility] ENDOSCOPY;  Service: Gastroenterology;  Laterality: N/A;   ESOPHAGOGASTRODUODENOSCOPY (EGD) WITH PROPOFOL N/A 09/17/2021   Procedure: ESOPHAGOGASTRODUODENOSCOPY (EGD) WITH PROPOFOL;  Surgeon: Wyline Mood, MD;  Location: [facility] ENDOSCOPY;  Service: Endoscopy;  Laterality: N/A;   LAPAROSCOPIC APPENDECTOMY  03/02/2012   Procedure: APPENDECTOMY LAPAROSCOPIC;  Surgeon: Fabio Bering, MD;  Location: [facility] ORS;  Service: General;  Laterality: N/A;   OVARIAN CYST REMOVAL Left    ROOT CANAL  04/07/2022   TUBAL LIGATION     WISDOM TOOTH EXTRACTION Bilateral    Social History   Social History Narrative   Not on file   Immunization  History  Administered Date(s) Administered   Influenza,inj,Quad PF,6+ Mos 11/14/2017, 11/24/2020, 12/10/2021   Influenza-Unspecified 11/17/2015, 12/12/2018, 12/03/2019   Moderna Sars-Covid-2 Vaccination 06/05/2019, 07/03/2019, 01/18/2020, 07/04/2020   PNEUMOCOCCAL CONJUGATE-20 11/24/2020   Tdap 05/02/2015   Zoster Recombinant(Shingrix) 05/09/2017, 07/12/2017     Objective: Vital Signs: BP 130/85 (BP Location: Left Arm, Patient Position: Sitting, Cuff Size: Normal)   Pulse (!) 56   Resp 16   Ht 5\' 4"  (1.626 m)   Wt 158 lb 12.8 oz (72 kg)   LMP 09/17/2014   BMI 27.26 kg/m    Physical Exam Vitals and nursing note reviewed.  Constitutional:      Appearance: She is well-developed.  HENT:     Head: Normocephalic and atraumatic.  Eyes:     Conjunctiva/sclera: Conjunctivae normal.  Cardiovascular:     Rate and Rhythm: Normal rate and regular rhythm.     Heart sounds: Normal heart sounds.  Pulmonary:     Effort: Pulmonary effort  is normal.     Breath sounds: Normal breath sounds.  Abdominal:     General: Bowel sounds are normal.     Palpations: Abdomen is soft.  Musculoskeletal:     Cervical back: Normal range of motion.  Lymphadenopathy:     Cervical: No cervical adenopathy.  Skin:    General: Skin is warm and dry.     Capillary Refill: Capillary refill takes less than 2 seconds.     Comments: Plaque psoriasis noted on the extensor surface of both elbows and a few patches overlying her hands.  Neurological:     Mental Status: She is alert and oriented to person, place, and time.  Psychiatric:        Behavior: Behavior normal.      Musculoskeletal Exam: C-spine, thoracic spine, lumbar spine have good range of motion.  No midline spinal tenderness.  Tenderness over both SI joints.  Shoulder joints, elbow joints, wrist joints, MCPs, PIPs, DIPs have good range of motion with no synovitis.  Hip joints have good range of motion with no groin pain.  Tenderness over the right  trochanteric bursa and along the right IT band.  Both knee joints have good range of motion with no warmth or effusion.  Ankle joints have good range of motion with no tenderness or joint swelling.  No evidence of Achilles tendinitis or plantar fasciitis.  CDAI Exam: CDAI Score: -- Patient Global: --; Provider Global: -- Swollen: --; Tender: -- Joint Exam 12/08/2022   No joint exam has been documented for this visit   There is currently no information documented on the homunculus. Go to the Rheumatology activity and complete the homunculus joint exam.  Investigation: No additional findings.  Imaging: No results found.  Recent Labs: Lab Results  Component Value Date   WBC 6.0 09/27/2022   HGB 14.1 09/27/2022   PLT 254 09/27/2022   NA 138 09/27/2022   K 4.7 09/27/2022   CL 102 09/27/2022   CO2 28 09/27/2022   GLUCOSE 104 (H) 09/27/2022   BUN 17 09/27/2022   CREATININE 0.67 09/27/2022   BILITOT 0.4 09/27/2022   ALKPHOS 71 07/27/2022   AST 14 09/27/2022   ALT 16 09/27/2022   PROT 7.0 09/27/2022   ALBUMIN 4.3 07/27/2022   CALCIUM 9.2 09/27/2022   GFRAA 115 06/16/2020   QFTBGOLDPLUS NEGATIVE 03/25/2022    Speciality Comments: MTX tabs 04/14/20-07/26/20 (stopped d/t nausea from folic acid); switched to Tremfya 08/04/21 Taltz 02/24/21  Procedures:  No procedures performed Allergies: Molnupiravir and Gold-containing drug products   Assessment / Plan:     Visit Diagnoses: Psoriatic arthritis (HCC): She has no synovitis or dactylitis on examination today.  No evidence of Achilles tendinitis or plantar fasciitis.  She continues to have chronic pain involving both SI joints and feels that her symptoms are recently exacerbated by cooler weather temperatures.  She is also having a flare of psoriasis which she attributes to cooler weather temperatures.   For the past 2 months she has been experiencing pain on the lateral aspect of her right knee extending to her right hip.  Her  symptoms seem consistent with IT band syndrome.  She has tenderness over the right trochanteric bursa as well.  She has tried home exercises and has been wearing a brace on her right knee for support.  On examination she has good range of motion of the right knee joint with no warmth or effusion.  Plan to refer the patient to physical therapy.  She will notify us if her symptoms persist or worsen.   She remains on Taltz 80 mg sq injections every 4 weeks.  She continues to tolerate Taltz without any side effects or injection site reactions.  She has not had any interruptions in therapy.  Her most recent dose of Taltz was administered on 11/30/22.  She will remain on Taltz as monotherapy.  She is vies notify us if her symptoms persist or worsen. She will follow up in the office in 5 months or sooner if needed.   Psoriasis: Currently having a flare of psoriasis which she attributes to cooler weather temperatures.  Plaque psoriasis noted on extensor surface of both elbows and several small patches overlying both hands.  She has a prescription for triamcinolone cream which she applies topically as needed and has been using a daily hand moisturizer.  She will remain on Taltz as prescribed.  She was vies notify us if her symptoms persist or worsen.  High risk medication use - Taltz 80 mg subcutaneous injections every 4 weeks.  Previous therapy: MTX and tremfya. Altamease Oiler was initiated on 02/24/2021.  CBC and CMP updated on 09/27/22.  Orders for CBC and CMP released today.  Her next lab work will be due at the end of January and every 3 months to monitor for drug toxicity. TB gold negative on 03/25/22. No recent or recurrent infections. discussed the importance of holding taltz if she develops signs or symptoms of an infection and to resume once the infection has completely cleared.   - Plan: CBC with Differential/Platelet, COMPLETE METABOLIC PANEL WITH GFR  Primary osteoarthritis of both hands: No synovitis or  dactylitis noted.  Complete fist formation bilaterally.  Chronic right SI joint pain: Patient has tenderness palpation over both SI joints.  She will remain on Taltz as prescribed.  She was vies to notify us if her symptoms persist or worsen.  Plantar fasciitis, bilateral: Not currently symptomatic.   Trochanteric bursitis of both hips:Right >left. She has been performing home exercises.  Referral to physical therapy will be placed today.  It band syndrome, right: Patient's been experiencing symptoms consistent with right IT band syndrome for the past 2 months.  She has tenderness outpatient over the right trochanteric bursa extending along the right IT band.  Her discomfort has been most severe at the distal insertion site of the IT band.  She has been wearing a knee brace and states that the compression has been helpful.  She has good range of motion of the right knee with no warmth or effusion.  She will benefit from physical therapy.  Referral to PT will be placed today.  She was vies notify us if her symptoms persist or worsen.  Primary osteoarthritis of both feet - Under care of Dr. Logan Bores.   Arthropathy of lumbar facet joint: No midline spinal tenderness in the lumbar region currently.  No symptoms of radiculopathy.  Osteopenia of multiple sites - 02/11/2022 DEXA scan T-score -1.8, BMD 0.795 left femoral neck.  She is taking a daily vitamin D supplement.  Due to update DEXA in December 2025.  Vitamin D deficiency: She is taking a daily vitamin D supplement.  Other medical conditions are listed as follows:  Prediabetes  Lobular carcinoma in situ (LCIS) of left breast  History of gastroesophageal reflux (GERD)  History of hyperlipidemia  History of hypothyroidism  Hx of migraines  Orders: Orders Placed This Encounter  Procedures   CBC with Differential/Platelet   COMPLETE METABOLIC PANEL  WITH GFR   No orders of the defined types were placed in this encounter.   Follow-Up  Instructions: Return in about 5 months (around 05/08/2023) for Psoriatic arthritis, Osteoarthritis.   Gearldine Bienenstock, PA-C  Note - This record has been created using Dragon software.  Chart creation errors have been sought, but may not always  have been located. Such creation errors do not reflect on  the standard of medical care.

## 2022-11-29 ENCOUNTER — Ambulatory Visit: Payer: Commercial Managed Care - PPO | Admitting: Family Medicine

## 2022-11-29 ENCOUNTER — Encounter: Payer: Commercial Managed Care - PPO | Admitting: Family Medicine

## 2022-12-01 ENCOUNTER — Ambulatory Visit: Payer: Commercial Managed Care - PPO | Admitting: Family Medicine

## 2022-12-08 ENCOUNTER — Encounter: Payer: Self-pay | Admitting: Physician Assistant

## 2022-12-08 ENCOUNTER — Ambulatory Visit: Payer: Commercial Managed Care - PPO | Attending: Physician Assistant | Admitting: Physician Assistant

## 2022-12-08 VITALS — BP 130/85 | HR 56 | Resp 16 | Ht 64.0 in | Wt 158.8 lb

## 2022-12-08 DIAGNOSIS — M7061 Trochanteric bursitis, right hip: Secondary | ICD-10-CM

## 2022-12-08 DIAGNOSIS — D0502 Lobular carcinoma in situ of left breast: Secondary | ICD-10-CM

## 2022-12-08 DIAGNOSIS — E559 Vitamin D deficiency, unspecified: Secondary | ICD-10-CM

## 2022-12-08 DIAGNOSIS — L4050 Arthropathic psoriasis, unspecified: Secondary | ICD-10-CM

## 2022-12-08 DIAGNOSIS — M19042 Primary osteoarthritis, left hand: Secondary | ICD-10-CM

## 2022-12-08 DIAGNOSIS — M19041 Primary osteoarthritis, right hand: Secondary | ICD-10-CM | POA: Diagnosis not present

## 2022-12-08 DIAGNOSIS — Z8639 Personal history of other endocrine, nutritional and metabolic disease: Secondary | ICD-10-CM

## 2022-12-08 DIAGNOSIS — M19072 Primary osteoarthritis, left ankle and foot: Secondary | ICD-10-CM

## 2022-12-08 DIAGNOSIS — Z8669 Personal history of other diseases of the nervous system and sense organs: Secondary | ICD-10-CM

## 2022-12-08 DIAGNOSIS — R7303 Prediabetes: Secondary | ICD-10-CM

## 2022-12-08 DIAGNOSIS — Z8719 Personal history of other diseases of the digestive system: Secondary | ICD-10-CM

## 2022-12-08 DIAGNOSIS — M533 Sacrococcygeal disorders, not elsewhere classified: Secondary | ICD-10-CM

## 2022-12-08 DIAGNOSIS — L409 Psoriasis, unspecified: Secondary | ICD-10-CM | POA: Diagnosis not present

## 2022-12-08 DIAGNOSIS — M7631 Iliotibial band syndrome, right leg: Secondary | ICD-10-CM

## 2022-12-08 DIAGNOSIS — M8589 Other specified disorders of bone density and structure, multiple sites: Secondary | ICD-10-CM

## 2022-12-08 DIAGNOSIS — M47816 Spondylosis without myelopathy or radiculopathy, lumbar region: Secondary | ICD-10-CM

## 2022-12-08 DIAGNOSIS — M722 Plantar fascial fibromatosis: Secondary | ICD-10-CM

## 2022-12-08 DIAGNOSIS — M19071 Primary osteoarthritis, right ankle and foot: Secondary | ICD-10-CM

## 2022-12-08 DIAGNOSIS — Z79899 Other long term (current) drug therapy: Secondary | ICD-10-CM

## 2022-12-08 DIAGNOSIS — G8929 Other chronic pain: Secondary | ICD-10-CM

## 2022-12-08 DIAGNOSIS — M7062 Trochanteric bursitis, left hip: Secondary | ICD-10-CM

## 2022-12-08 NOTE — Addendum Note (Signed)
Addended by: Ellen Henri on: 12/08/2022 02:46 PM   Modules accepted: Orders

## 2022-12-08 NOTE — Progress Notes (Signed)
CBC WNL

## 2022-12-08 NOTE — Patient Instructions (Addendum)
Standing Labs We placed an order today for your standing lab work.   Please have your standing labs drawn in January and every 3 months   Please have your labs drawn 2 weeks prior to your appointment so that the provider can discuss your lab results at your appointment, if possible.  Please note that you may see your imaging and lab results in MyChart before we have reviewed them. We will contact you once all results are reviewed. Please allow our office up to 72 hours to thoroughly review all of the results before contacting the office for clarification of your results.  WALK-IN LAB HOURS  Monday through Thursday from 8:00 am -12:30 pm and 1:00 pm-5:00 pm and Friday from 8:00 am-12:00 pm.  Patients with office visits requiring labs will be seen before walk-in labs.  You may encounter longer than normal wait times. Please allow additional time. Wait times may be shorter on  Monday and Thursday afternoons.  We do not book appointments for walk-in labs. We appreciate your patience and understanding with our staff.   Labs are drawn by [facility]. Please bring your co-pay at the time of your lab draw.  You may receive a bill from [facility] for your lab work.  Please note if you are on Hydroxychloroquine and and an order has been placed for a Hydroxychloroquine level,  you will need to have it drawn 4 hours or more after your last dose.  If you wish to have your labs drawn at another location, please call the office 24 hours in advance so we can fax the orders.  The office is located at [address], [place], [place] [address]   If you have any questions regarding directions or hours of operation,  please call [phone].   As a reminder, please drink plenty of water prior to coming for your lab work. Thanks!   Iliotibial Band Syndrome Rehab Ask your health care provider which exercises are safe for you. Do exercises exactly as told by your provider and adjust them as told. It's  normal to feel mild stretching, pulling, tightness, or discomfort as you do these exercises. Stop right away if you feel sudden pain or your pain gets a lot worse. Do not begin these exercises until told by your provider. Stretching and range-of-motion exercises These exercises warm up your muscles and joints. They also improve the movement and flexibility of your hip and pelvis. Quadriceps stretch, prone  Lie face down (prone) on a firm surface like a bed or padded floor. Bend your left / right knee. Reach back to hold your ankle or pant leg. If you can't reach your ankle or pant leg, use a belt looped around your foot and grab the belt instead. Gently pull your heel toward your butt. Your knee should not slide out to the side. You should feel a stretch in the front of your thigh and knee, also called the quadriceps. Hold this position for __________ seconds. Repeat __________ times. Complete this exercise __________ times a day. Iliotibial band stretch The iliotibial band is a strip of tissue that runs along the outside of your hip down to your knee. Lie on your side with your left / right leg on top. Bend both knees and grab your left / right ankle. Stretch out your bottom arm to help you balance. Slowly bring your top knee back so your thigh goes behind your back. Slowly lower your top leg toward the floor until you feel a  gentle stretch on the outside of your left / right hip and thigh. If you don't feel a stretch and your knee won't go farther, place the heel of your other foot on top of your knee and pull your knee down toward the floor with your foot. Hold this position for __________ seconds. Repeat __________ times. Complete this exercise __________ times a day. Strengthening exercises These exercises build strength and endurance in your hip and pelvis. Endurance means your muscles can keep working even when they're tired. Straight leg raises, side-lying This exercise strengthens the  muscles that rotate the leg at the hip and move it away from your body. These muscles are called hip abductors. Lie on your side with your left / right leg on top. Lie so your head, shoulder, hip, and knee line up. You can bend your bottom knee to help you balance. Roll your hips slightly forward so they're stacked directly over each other. Your left / right knee should face forward. Tense the muscles in your outer thigh and hip. Lift your top leg 4-6 inches (10-15 cm) off the ground. Hold this position for __________ seconds. Slowly lower your leg back down to the starting position. Let your muscles fully relax before doing this exercise again. Repeat __________ times. Complete this exercise __________ times a day. Leg raises, prone This exercise strengthens the muscles that move the hips backward. These muscles are called hip extensors. Lie face down (prone) on your bed or a firm surface. You can put a pillow under your hips for comfort and to support your lower back. Bend your left / right knee so your foot points straight up toward the ceiling. Keep the other leg straight and behind you. Squeeze your butt muscles. Lift your left / right thigh off the firm surface. Do not let your back arch. Tense your thigh muscle as hard as you can without having more knee pain. Hold this position for __________ seconds. Slowly lower your leg to the starting position. Allow your leg to relax all the way. Repeat __________ times. Complete this exercise __________ times a day. Hip hike  Stand sideways on a bottom step. Place your feet so that your left / right leg is on the step, and the other foot is hanging off the side. If you need support for balance, hold onto a railing or wall. Keep your knees straight and your abdomen square, meaning your hips are level. Then, lift your left / right hip up toward the ceiling. Slowly let your leg that's hanging off the step lower towards the floor. Your foot should get  closer to the ground. Do not lean or bend your knees during this movement. Repeat __________ times. Complete this exercise __________ times a day. This information is not intended to replace advice given to you by your health care provider. Make sure you discuss any questions you have with your health care provider. Document Revised: 04/16/2022 Document Reviewed: 04/16/2022 Elsevier Patient Education  2024 [employer].

## 2022-12-09 LAB — CBC WITH DIFFERENTIAL/PLATELET
Absolute Lymphocytes: 1691 {cells}/uL (ref 850–3900)
Absolute Monocytes: 616 {cells}/uL (ref 200–950)
Basophils Absolute: 62 {cells}/uL (ref 0–200)
Basophils Relative: 1.1 %
Eosinophils Absolute: 213 {cells}/uL (ref 15–500)
Eosinophils Relative: 3.8 %
HCT: 40.7 % (ref 35.0–45.0)
Hemoglobin: 13.6 g/dL (ref 11.7–15.5)
MCH: 30.6 pg (ref 27.0–33.0)
MCHC: 33.4 g/dL (ref 32.0–36.0)
MCV: 91.7 fL (ref 80.0–100.0)
MPV: 11.7 fL (ref 7.5–12.5)
Monocytes Relative: 11 %
Neutro Abs: 3018 {cells}/uL (ref 1500–7800)
Neutrophils Relative %: 53.9 %
Platelets: 217 10*3/uL (ref 140–400)
RBC: 4.44 10*6/uL (ref 3.80–5.10)
RDW: 13 % (ref 11.0–15.0)
Total Lymphocyte: 30.2 %
WBC: 5.6 10*3/uL (ref 3.8–10.8)

## 2022-12-09 LAB — COMPLETE METABOLIC PANEL WITH GFR
AG Ratio: 1.8 (calc) (ref 1.0–2.5)
ALT: 15 U/L (ref 6–29)
AST: 15 U/L (ref 10–35)
Albumin: 4.4 g/dL (ref 3.6–5.1)
Alkaline phosphatase (APISO): 78 U/L (ref 37–153)
BUN: 17 mg/dL (ref 7–25)
CO2: 28 mmol/L (ref 20–32)
Calcium: 9.9 mg/dL (ref 8.6–10.4)
Chloride: 103 mmol/L (ref 98–110)
Creat: 0.75 mg/dL (ref 0.50–1.03)
Globulin: 2.5 g/dL (ref 1.9–3.7)
Glucose, Bld: 108 mg/dL — ABNORMAL HIGH (ref 65–99)
Potassium: 4.8 mmol/L (ref 3.5–5.3)
Sodium: 140 mmol/L (ref 135–146)
Total Bilirubin: 0.3 mg/dL (ref 0.2–1.2)
Total Protein: 6.9 g/dL (ref 6.1–8.1)
eGFR: 92 mL/min/{1.73_m2} (ref >=60)

## 2022-12-09 NOTE — Progress Notes (Signed)
Glucose is 108. Rest of CMP WNL

## 2022-12-13 ENCOUNTER — Ambulatory Visit: Payer: Commercial Managed Care - PPO | Admitting: Family Medicine

## 2022-12-15 ENCOUNTER — Encounter: Payer: Self-pay | Admitting: Family Medicine

## 2022-12-15 ENCOUNTER — Ambulatory Visit: Payer: Commercial Managed Care - PPO | Admitting: Family Medicine

## 2022-12-15 VITALS — BP 126/80 | HR 68 | Temp 97.1°F | Ht 64.0 in | Wt 155.2 lb

## 2022-12-15 DIAGNOSIS — E039 Hypothyroidism, unspecified: Secondary | ICD-10-CM | POA: Diagnosis not present

## 2022-12-15 DIAGNOSIS — Z0000 Encounter for general adult medical examination without abnormal findings: Secondary | ICD-10-CM | POA: Diagnosis not present

## 2022-12-15 DIAGNOSIS — E559 Vitamin D deficiency, unspecified: Secondary | ICD-10-CM | POA: Diagnosis not present

## 2022-12-15 DIAGNOSIS — E785 Hyperlipidemia, unspecified: Secondary | ICD-10-CM

## 2022-12-15 DIAGNOSIS — R7303 Prediabetes: Secondary | ICD-10-CM

## 2022-12-15 NOTE — Assessment & Plan Note (Signed)
Physical exam completed.  Encouraged healthy diet and exercise.  Cancer screenings up-to-date.  She declines further COVID vaccinations.  Lab work as outlined.

## 2022-12-15 NOTE — Progress Notes (Signed)
Marikay Alar, MD Phone: [phone]  Erica Woods is a 57 y.o. female who presents today for CPE.  Diet: Typically will have cereal for breakfast if she eats breakfast.  Does have red meat several times a week.  Does eat sweets.  No soda or sweet tea. 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 11/22/2014   Migraines 11/25/2014   GERD (gastroesophageal reflux disease) 11/25/2014   Routine general medical examination at a health care facility 05/02/2015   History of breast cancer 11/19/2015   Arthralgia of both hands 05/04/2016   Hypothyroidism 11/08/2016   Muscle cramps 11/08/2016   Plantar fasciitis, left 07/05/2016   Psoriasis 07/05/2016   Hot flashes 07/29/2017   Hyperlipidemia 07/29/2017   Stress 11/14/2017   Prediabetes 09/20/2018   Rash 03/26/2020   Postmenopausal bleeding 11/24/2020   Vitamin D deficiency 05/25/2021   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   Resolved Ambulatory Problems    Diagnosis Date Noted   Neoplasm of breast, 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/2014   Sinusitis, acute frontal 01/28/2016   Dysuria 10/08/2016   Pain of right hand 09/20/2018   Decreased energy 03/26/2020   Sore throat 01/05/2021   Past Medical History:  Diagnosis Date   Allergic rhinitis    Breast cancer (HCC)    Chickenpox    Headache    Hemorrhoids    History of blood transfusion    Hypercholesteremia     Hyperthyroidism     Family History  Problem Relation Age of Onset   Diabetes Mother    Osteoporosis Mother    Heart attack Father    Diabetes Sister    Cancer Sister    Parkinson's disease Sister    Tuberculosis Brother    Irregular heart beat Brother    Healthy Son    Irregular heart beat Son    Healthy Son    Hypertension Other    Diabetes Other     Social History   Socioeconomic History   Marital status: Married    Spouse name: Not on file   Number of children: Not on file   Years of education: Not on file   Highest education level: Associate degree: occupational, Scientist, product/process development, or vocational program  Occupational History   Not on file  Tobacco Use   Smoking status: Never    Passive exposure: Never   Smokeless tobacco: Never  Vaping Use   Vaping status: Never Used  Substance and Sexual Activity   Alcohol use: Not Currently   Drug use: No   Sexual activity: Yes    Partners: Male    Birth control/protection: Post-menopausal    Comment: Husband  Other Topics Concern   Not on file  Social History Narrative   Not on file   Social Determinants of Health   Financial Resource Strain: Low Risk  (12/13/2022)   Overall Financial Resource Strain (CARDIA)    Difficulty of Paying Living Expenses: Not hard at all  Food  Insecurity: No Food Insecurity (12/13/2022)   Hunger Vital Sign    Worried About Running Out of Food in the Last Year: Never true    Ran Out of Food in the Last Year: Never true  Transportation Needs: No Transportation Needs (12/13/2022)   PRAPARE - Administrator, Civil Service (Medical): No    Lack of Transportation (Non-Medical): No  Physical Activity: Insufficiently Active (12/13/2022)   Exercise Vital Sign    Days of Exercise per Week: 3 days    Minutes of Exercise per Session: 30 min  Stress: No Stress Concern Present (12/13/2022)   [employer] of Occupational Health - Occupational Stress Questionnaire    Feeling of Stress : Not  at all  Social Connections: Moderately Integrated (12/13/2022)   Social Connection and Isolation Panel [NHANES]    Frequency of Communication with Friends and Family: More than three times a week    Frequency of Social Gatherings with Friends and Family: Once a week    Attends Religious Services: 1 to 4 times per year    Active Member of [employer] or Organizations: No    Attends Engineer, structural: Not on file    Marital Status: Married  Catering manager Violence: Not on file    ROS  General:  Negative for nexplained weight loss, fever Skin: Negative for new or changing mole, sore that won't heal HEENT: Negative for trouble hearing, trouble seeing, ringing in ears, mouth sores, hoarseness, change in voice, dysphagia. CV:  Negative for chest pain, dyspnea, edema, palpitations Resp: Negative for cough, dyspnea, hemoptysis GI: Negative for nausea, vomiting, diarrhea, constipation, abdominal pain, melena, hematochezia. GU: Negative for dysuria, incontinence, urinary hesitance, hematuria, vaginal or penile discharge, polyuria, sexual difficulty, lumps in testicle or breasts MSK: Negative for muscle cramps or aches, joint pain or swelling Neuro: Negative for headaches, weakness, numbness, dizziness, passing out/fainting Psych: Negative for depression, anxiety, memory problems  Objective  Physical Exam Vitals:   12/15/22 1500  BP: 126/80  Pulse: 68  Temp: (!) 97.1 F (36.2 C)  SpO2: 98%    BP Readings from Last 3 Encounters:  12/15/22 126/80  12/08/22 130/85  07/08/22 113/72   Wt Readings from Last 3 Encounters:  12/15/22 155 lb 3.2 oz (70.4 kg)  12/08/22 158 lb 12.8 oz (72 kg)  07/08/22 161 lb (73 kg)    Physical Exam Constitutional:      General: She is not in acute distress.    Appearance: She is not diaphoretic.  HENT:     Head: Normocephalic and atraumatic.  Cardiovascular:     Rate and Rhythm: Normal rate and regular rhythm.     Heart sounds: Normal heart  sounds.  Pulmonary:     Effort: Pulmonary effort is normal.     Breath sounds: Normal breath sounds.  Chest:       Comments: Prince Solian, CMA served as chaperone, other than postsurgical changes there are no skin changes to the breast, no breast masses, no nipple inversion, no axillary masses Abdominal:     General: Bowel sounds are normal. There is no distension.     Tenderness: There is no abdominal tenderness.  Musculoskeletal:     Right lower leg: No edema.     Left lower leg: No edema.  Lymphadenopathy:     Cervical: No cervical adenopathy.  Skin:    General: Skin is warm and dry.  Neurological:     Mental Status: She is alert.  Psychiatric:  Mood and Affect: Mood normal.      Assessment/Plan:   Routine general medical examination at a health care facility Assessment & Plan: Physical exam completed.  Encouraged healthy diet and exercise.  Cancer screenings up-to-date.  She declines further COVID vaccinations.  Lab work as outlined.   Prediabetes -     Hemoglobin A1c  Vitamin D deficiency -     VITAMIN D 25 Hydroxy (Vit-D Deficiency, Fractures)  Hypothyroidism, unspecified type -     TSH  Hyperlipidemia, unspecified hyperlipidemia type -     Lipid panel    Return in about 6 months (around 06/15/2023) for Transfer of care.   Marikay Alar, MD [facility]

## 2022-12-16 LAB — LIPID PANEL
Cholesterol: 285 mg/dL — ABNORMAL HIGH (ref 0–200)
HDL: 58.2 mg/dL (ref >39.00)
LDL Cholesterol: 203 mg/dL — ABNORMAL HIGH (ref 0–99)
NonHDL: 226.57
Total CHOL/HDL Ratio: 5
Triglycerides: 117 mg/dL (ref 0.0–149.0)
VLDL: 23.4 mg/dL (ref 0.0–40.0)

## 2022-12-16 LAB — HEMOGLOBIN A1C: Hgb A1c MFr Bld: 6.3 % (ref 4.6–6.5)

## 2022-12-16 LAB — VITAMIN D 25 HYDROXY (VIT D DEFICIENCY, FRACTURES): VITD: 40.2 ng/mL (ref 30.00–100.00)

## 2022-12-16 LAB — TSH: TSH: 4.41 u[IU]/mL (ref 0.35–5.50)

## 2022-12-17 ENCOUNTER — Ambulatory Visit (HOSPITAL_COMMUNITY): Payer: Commercial Managed Care - PPO | Attending: Physician Assistant

## 2022-12-17 ENCOUNTER — Other Ambulatory Visit: Payer: Self-pay | Admitting: Family Medicine

## 2022-12-17 DIAGNOSIS — R262 Difficulty in walking, not elsewhere classified: Secondary | ICD-10-CM | POA: Diagnosis present

## 2022-12-17 DIAGNOSIS — M7062 Trochanteric bursitis, left hip: Secondary | ICD-10-CM | POA: Diagnosis not present

## 2022-12-17 DIAGNOSIS — M7631 Iliotibial band syndrome, right leg: Secondary | ICD-10-CM | POA: Insufficient documentation

## 2022-12-17 DIAGNOSIS — M7061 Trochanteric bursitis, right hip: Secondary | ICD-10-CM | POA: Insufficient documentation

## 2022-12-17 DIAGNOSIS — E7800 Pure hypercholesterolemia, unspecified: Secondary | ICD-10-CM

## 2022-12-17 MED ORDER — REPATHA SURECLICK 140 MG/ML ~~LOC~~ SOAJ: 140.0000 mg | SUBCUTANEOUS | 2 refills | Status: DC

## 2022-12-17 NOTE — Therapy (Signed)
OUTPATIENT PHYSICAL THERAPY EVALUATION   Patient Name: Erica Woods MRN: [id number] DOB:10/25/1965, 57 y.o., female Today's Date: 12/17/2022  END OF SESSION:  PT End of Session - 12/17/22 0853     Visit Number 1    Number of Visits 12    Date for PT Re-Evaluation 01/28/23    Authorization Type [employer]    Authorization Time Period 12/17/22    Progress Note Due on Visit 10    PT Start Time 0845    PT Stop Time 0925    PT Time Calculation (min) 40 min    Activity Tolerance Patient tolerated treatment well;No increased pain    Behavior During Therapy WFL for tasks assessed/performed             Past Medical History:  Diagnosis Date   Allergic rhinitis    Breast cancer (HCC)    Chickenpox    GERD (gastroesophageal reflux disease)    Headache    Hemorrhoids    History of blood transfusion    Hypercholesteremia    Hyperthyroidism    Past Surgical History:  Procedure Laterality Date   ABDOMINAL SURGERY     APPENDECTOMY     BALLOON DILATION N/A 02/04/2017   Procedure: BALLOON DILATION;  Surgeon: Wyline Mood, MD;  Location: [facility] ENDOSCOPY;  Service: Gastroenterology;  Laterality: N/A;   BREAST BIOPSY Left 10/02/2014   Procedure: LEFT BREAST BIOPSY AFTER NEEDLE LOCALIZATION X TWO;  Surgeon: Franky Macho, MD;  Location: [facility] ORS;  Service: General;  Laterality: Left;   BREAST LUMPECTOMY Left 2016   benign   COLONOSCOPY N/A 10/23/2014   Procedure: COLONOSCOPY;  Surgeon: Malissa Hippo, MD;  Location: [facility] ENDO SUITE;  Service: Endoscopy;  Laterality: N/A;  730   ESOPHAGOGASTRODUODENOSCOPY (EGD) WITH PROPOFOL N/A 02/04/2017   Procedure: ESOPHAGOGASTRODUODENOSCOPY (EGD) WITH PROPOFOL;  Surgeon: Wyline Mood, MD;  Location: [facility] ENDOSCOPY;  Service: Gastroenterology;  Laterality: N/A;   ESOPHAGOGASTRODUODENOSCOPY (EGD) WITH PROPOFOL N/A 09/17/2021   Procedure: ESOPHAGOGASTRODUODENOSCOPY (EGD) WITH PROPOFOL;  Surgeon: Wyline Mood, MD;  Location: [facility] ENDOSCOPY;  Service: Endoscopy;   Laterality: N/A;   LAPAROSCOPIC APPENDECTOMY  03/02/2012   Procedure: APPENDECTOMY LAPAROSCOPIC;  Surgeon: Fabio Bering, MD;  Location: [facility] ORS;  Service: General;  Laterality: N/A;   OVARIAN CYST REMOVAL Left    ROOT CANAL  04/07/2022   TUBAL LIGATION     WISDOM TOOTH EXTRACTION Bilateral    Patient Active Problem List   Diagnosis Date Noted   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   Vitamin D deficiency 05/25/2021   Postmenopausal bleeding 11/24/2020   Rash 03/26/2020   Prediabetes 09/20/2018   Stress 11/14/2017   Hot flashes 07/29/2017   Hyperlipidemia 07/29/2017   Hypothyroidism 11/08/2016   Muscle cramps 11/08/2016   Plantar fasciitis, left 07/05/2016   Psoriasis 07/05/2016   Arthralgia of both hands 05/04/2016   History of breast cancer 11/19/2015   Routine general medical examination at a health care facility 05/02/2015   Migraines 11/25/2014   GERD (gastroesophageal reflux disease) 11/25/2014   Atypical lobular hyperplasia [facility]) of left breast 11/22/2014    PCP: Marikay Alar, MD   REFERRING PROVIDER: Sherron Ales PA-C, Rheumatology  REFERRING DIAG: "Trochanteric Bursitis of both hips; Right ITB syndrome"  THERAPY DIAG:  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 Pt given a slurry of exercises to perform from her provider.   PERTINENT HISTORY: Erica Woods is a 57yoF who is referred to OPPT by Rheumatology for Rt sided ITBS. Pt reports pain in both Rt lateral knee, as well as Rt posterolateral hip area, although she reports the more proximal of the 2 being  3 years chronic and the latter being ~2-3 months chronic. Pt reports bilat low back pain that is more chronic, she does not feel a part of this current issue. Pt reports Rt hip diagnosis being labeled as bursitis, chart indicates trochanteric. At baseline pt AMB 5-6 day weekly x30 minutes for physical activity, but has not does this since the Summer. 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 Pt takes 600mg  ibuprofen wehn pain is bad, this helps a lot. Pt has never taken PT services for her Right hip issue.   Pain Behavior: Current pain rated 8/10, as bad as 10/10, and as good as 6/10, but pt does not have any complete pain remission. Pt most aggravated by sitting postures and ascending stairs > descending stairs.    PMH: Arthropathy of lumbar facet joint, osteoarthritis of both feet, Osteopenia via DEXA vitamin D deficiency, Lobular carcinoma in situ (LCIS) of left breast,  GERD, HLD, hypoTSH, migraines.  PAIN:  Are you having pain? YES 8/10 Rt hip, Rt knee   PRECAUTIONS: None; osteopenia  WEIGHT BEARING RESTRICTIONS: No  FALLS:  Has patient fallen in last 6 months? N  LIVING ENVIRONMENT: Lives with: Husband, older son 26, works FT  Lives in: Art therapist home 2000sqft Stairs: 8 stairs, 2 rails   OCCUPATION: FT CNA, memory, works nights   PLOF: used to walk 3x/week   PATIENT GOALS:   NEXT MD VISIT: unknown  OBJECTIVE:  Note: Objective measures were completed at Evaluation unless otherwise noted.  DIAGNOSTIC FINDINGS:  Primary osteoarthritis of both feet - Under care of Dr. Logan Bores, arthropathy of lumbar facet joint: No midline spinal tenderness in the lumbar region currently.  No symptoms of radiculopathy.  PATIENT SURVEYS:  *defer HOOS to visit 2  COGNITION: Overall cognitive status: WNL     SENSATION: Reports an area of numbness near the distal most 4 inches of the ITB area exclusively proximal the joint line; Pt reports during remote pregnancy having Rt sided  numbness higher up in the Rt pocket distribution of the lateral femoral cutaneous nerve.   EDEMA:  Minimal to zero edema about the patella, patella ligament, lateral right knee.    PALPATION:  -Taut bands of the distal most lateral quads fiber anterior to the ITB, 1-2 inches away from patella; very tender initially, but improved with sustained Ischaemic release, multiple twitch areas noted, release following over 4 minutes.   -Rt lateral gluteals: moderately tender   -Rt greater trochanter: mildly tender, not consistent with CC pain   -Rt posterolateral gluteals: mega tender  -Bialt lumbar paras are equally tender   LOWER QUARTER EXAM:   Right eval Left eval  Hip flexion 120 deg*   Hip extension Poor tolerance to zero degrees supine    Hip extension (supine @ 30 deg flexion)  3+/5*  4+/5  Hip abduction 4+/5 4+/5  Hip abduction 45 deg   Hip internal rotation 30*   Hip external rotation 45     LOWER EXTREMITY  SPECIAL TESTS:  -External DeRotation Test (Gluteal tendinopathy): Rt negative   FUNCTIONAL TESTS:  5xSTS: 12.28 hands free (mild pain limitations)  GAIT: -fairly antalgic and compensated -abducted RLE  -avoids most normal knee flexion in swing phase -high velocity medium amplitude trendelenburg of Rt hip during loading phase   TODAY'S TREATMENT:                                                                                                                              DATE:   -evaluation only    PATIENT EDUCATION:  Education details: explained exam findings, association of clinical bursitis with muscle//tendon injury in the literature.  Person educated: Patient, husband  Education method: talk therapy Education comprehension: 7/10   HOME EXERCISE PROGRAM: Deferred to later date/time once all examination is completed and in-session interventions seen as well tolerated   ASSESSMENT:  CLINICAL IMPRESSION: 57yoF referred for Rt ITBS. Pt has focal pain to Rt  distal knee on both sides of joint line, numbness at the proximal lateral knee, negative external derotation test, non-weak and minimally painful testing of hip ABDCT, painful /weak hip extension, intolerance to neutral hip extension at 0, and severe intolerance to palpation of posterior gluteals. Gait is fairly antalgic from a knee standpoint, however revealing of failure Rt hip ABDCT motor control. Cannot rule out ITBS as primary or secondary, however presentation has atypical variation; same to be said for gluteal tendinopathy, atypical features. Suspect some sacroiliac etiology, however tenderness would not allow for screening cluster. Pt appears to have significant pain with passive counternutation of Right hemipelvis on sacrum. Pt reports history of Right sided sciatica during remote pregnancy decades ago. Physical therapy will address pain, activity tolerance, and weakness to restore to PLOF.   OBJECTIVE IMPAIRMENTS: Abnormal gait, cardiopulmonary status limiting activity, decreased activity tolerance, decreased balance, decreased knowledge of use of DME, decreased mobility, difficulty walking, decreased ROM, decreased strength, impaired perceived functional ability, increased muscle spasms, and impaired sensation.   ACTIVITY LIMITATIONS: carrying, lifting, bending, sitting, standing, squatting, sleeping, stairs, transfers, and toileting  PARTICIPATION LIMITATIONS: meal prep, cleaning, laundry, interpersonal relationship, driving, community activity, and occupation  PERSONAL FACTORS: Age, Behavior pattern, Fitness, Past/current experiences, Profession, Time since onset of injury/illness/exacerbation, and Transportation are also affecting patient's functional outcome.   REHAB POTENTIAL: Good  CLINICAL DECISION MAKING: Stable/uncomplicated  EVALUATION COMPLEXITY: Moderate   GOALS: Goals reviewed with patient? No  SHORT TERM GOALS: Target date: 01/14/23 Pt to report reduced peak pain in Rt  hip during work and housework, <8/10.  Baseline: Goal status: INITIAL  2.  Pt to report compliance with HEP for assist with core motor control and pain management  Baseline:  Goal status: INITIAL  3.  Pt to improve 5xSTS but >1 second Baseline:  Goal status: INITIAL  LONG TERM GOALS: Target date: 01/28/23  KOOS score improvement >20% to reveal reduced disability from hip pain.  Baseline: visit 2  Goal status: INITIAL  2.  Pt to demonstrate performance of >1576ft with device ad lib, without pain increase any >2 points.  Baseline:  Goal status: INITIAL  3.  Pt to demonstrate 5xSTS <11sec to show BLE power output unrestricted by Rt hip pain.  Baseline:  Goal status: INITIAL  4.  Pt to report ability to AMB entry stairs of home in/out without pain increase in RLE.  Baseline:  Goal status: INITIAL  PLAN:  PT FREQUENCY: 1-2x/week   PT DURATION: 6 weeks  PLANNED INTERVENTIONS: 97110-Therapeutic exercises, 97530- Therapeutic activity, [id number]- Neuromuscular re-education, 97535- Self Care, 16109- Manual therapy, [phone]- Gait training, 97014- Electrical stimulation (unattended), [phone]- Electrical stimulation (manual), [id number]- Ultrasound, 91478- Ionotophoresis 4mg /ml Dexamethasone, Patient/Family education, Balance training, Stair training, Taping, Dry Needling, Joint mobilization, Vestibular training, DME instructions, Wheelchair mobility training, and Moist heat  PLAN FOR NEXT SESSION: KOOS survey, MMT hip rotation,   Rosamaria Lints, PT Physical Therapist [facility]  [phone]   Shalinda Burkholder C, PT 12/17/2022, 8:57 AM

## 2022-12-21 ENCOUNTER — Encounter (HOSPITAL_COMMUNITY): Payer: Self-pay

## 2022-12-21 ENCOUNTER — Ambulatory Visit (HOSPITAL_COMMUNITY): Payer: Commercial Managed Care - PPO

## 2022-12-21 DIAGNOSIS — R262 Difficulty in walking, not elsewhere classified: Secondary | ICD-10-CM

## 2022-12-21 DIAGNOSIS — M7631 Iliotibial band syndrome, right leg: Secondary | ICD-10-CM

## 2022-12-21 NOTE — Therapy (Signed)
OUTPATIENT PHYSICAL THERAPY TREATMENT   Patient Name: Erica Woods MRN: [id number] DOB:29-May-1965, 57 y.o., female Today's Date: 12/22/2022  END OF SESSION:  PT End of Session - 12/22/22 0845     Visit Number 3    Number of Visits 12    Date for PT Re-Evaluation 01/28/23    Authorization Type U[employer]    Authorization Time Period 12/17/22    Progress Note Due on Visit 10    PT Start Time 0845    PT Stop Time 0926    PT Time Calculation (min) 41 min    Activity Tolerance Patient tolerated treatment well;No increased pain    Behavior During Therapy WFL for tasks assessed/performed               Past Medical History:  Diagnosis Date   Allergic rhinitis    Breast cancer (HCC)    Chickenpox    GERD (gastroesophageal reflux disease)    Headache    Hemorrhoids    History of blood transfusion    Hypercholesteremia    Hyperthyroidism    Past Surgical History:  Procedure Laterality Date   ABDOMINAL SURGERY     APPENDECTOMY     BALLOON DILATION N/A 02/04/2017   Procedure: BALLOON DILATION;  Surgeon: Wyline Mood, MD;  Location: [facility] ENDOSCOPY;  Service: Gastroenterology;  Laterality: N/A;   BREAST BIOPSY Left 10/02/2014   Procedure: LEFT BREAST BIOPSY AFTER NEEDLE LOCALIZATION X TWO;  Surgeon: Franky Macho, MD;  Location: [facility] ORS;  Service: General;  Laterality: Left;   BREAST LUMPECTOMY Left 2016   benign   COLONOSCOPY N/A 10/23/2014   Procedure: COLONOSCOPY;  Surgeon: Malissa Hippo, MD;  Location: [facility];  Service: Endoscopy;  Laterality: N/A;  730   ESOPHAGOGASTRODUODENOSCOPY (EGD) WITH PROPOFOL N/A 02/04/2017   Procedure: ESOPHAGOGASTRODUODENOSCOPY (EGD) WITH PROPOFOL;  Surgeon: Wyline Mood, MD;  Location: [facility] ENDOSCOPY;  Service: Gastroenterology;  Laterality: N/A;   ESOPHAGOGASTRODUODENOSCOPY (EGD) WITH PROPOFOL N/A 09/17/2021   Procedure: ESOPHAGOGASTRODUODENOSCOPY (EGD) WITH PROPOFOL;  Surgeon: Wyline Mood, MD;  Location: [facility] ENDOSCOPY;  Service: Endoscopy;   Laterality: N/A;   LAPAROSCOPIC APPENDECTOMY  03/02/2012   Procedure: APPENDECTOMY LAPAROSCOPIC;  Surgeon: Fabio Bering, MD;  Location: [facility];  Service: General;  Laterality: N/A;   OVARIAN CYST REMOVAL Left    ROOT CANAL  04/07/2022   TUBAL LIGATION     WISDOM TOOTH EXTRACTION Bilateral    Patient Active Problem List   Diagnosis Date Noted   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   Vitamin D deficiency 05/25/2021   Postmenopausal bleeding 11/24/2020   Rash 03/26/2020   Prediabetes 09/20/2018   Stress 11/14/2017   Hot flashes 07/29/2017   Hyperlipidemia 07/29/2017   Hypothyroidism 11/08/2016   Muscle cramps 11/08/2016   Plantar fasciitis, left 07/05/2016   Psoriasis 07/05/2016   Arthralgia of both hands 05/04/2016   History of breast cancer 11/19/2015   Routine general medical examination at a health care facility 05/02/2015   Migraines 11/25/2014   GERD (gastroesophageal reflux disease) 11/25/2014   Atypical lobular hyperplasia [facility]) of left breast 11/22/2014    PCP: Marikay Alar, MD   REFERRING PROVIDER: Sherron Ales PA-C, Rheumatology  REFERRING DIAG: "Trochanteric Bursitis of both hips; Right ITB syndrome"  THERAPY DIAG:  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 Pt given a slurry of exercises to perform from her provider.   PERTINENT HISTORY: Erica Woods is a 57yoF who is referred to OPPT by Rheumatology for Rt sided ITBS. Pt reports pain in both Rt lateral knee, as well as Rt posterolateral hip area, although she reports the more proximal of the 2 being  3 years chronic and the latter being ~2-3 months chronic. Pt reports bilat low back pain that is more chronic, she does not feel a part of this current issue. Pt reports Rt hip diagnosis being labeled as bursitis, chart indicates trochanteric. At baseline pt AMB 5-6 day weekly x30 minutes for physical activity, but has not does this since the Summer. 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 Pt takes 600mg  ibuprofen wehn pain is bad, this helps a lot. Pt has never taken PT services for her Right hip issue.   Pain Behavior: Current pain rated 8/10, as bad as 10/10, and as good as 6/10, but pt does not have any complete pain remission. Pt most aggravated by sitting postures and ascending stairs > descending stairs.    PMH: Arthropathy of lumbar facet joint, osteoarthritis of both feet, Osteopenia via DEXA vitamin D deficiency, Lobular carcinoma in situ (LCIS) of left breast,  GERD, HLD, hypoTSH, migraines.  PAIN:  Are you having pain? YES 8/10 Rt hip, Rt knee   PRECAUTIONS: None; osteopenia  WEIGHT BEARING RESTRICTIONS: No  FALLS:  Has patient fallen in last 6 months? N  LIVING ENVIRONMENT: Lives with: Husband, older son 81, works FT  Lives in: Art therapist home 2000sqft Stairs: 8 stairs, 2 rails   OCCUPATION: FT CNA, memory, works nights   PLOF: used to walk 3x/week   PATIENT GOALS:   NEXT MD VISIT: unknown  OBJECTIVE:  Note: Objective measures were completed at Evaluation unless otherwise noted.  DIAGNOSTIC FINDINGS:  Primary osteoarthritis of both feet - Under care of Dr. Logan Bores, arthropathy of lumbar facet joint: No midline spinal tenderness in the lumbar region currently.  No symptoms of radiculopathy.  PATIENT SURVEYS:  *defer HOOS to visit 2  COGNITION: Overall cognitive status: WNL     SENSATION: Reports an area of numbness near the distal most 4 inches of the ITB area exclusively proximal the joint line; Pt reports during remote pregnancy having Rt sided  numbness higher up in the Rt pocket distribution of the lateral femoral cutaneous nerve.   EDEMA:  Minimal to zero edema about the patella, patella ligament, lateral right knee.    PALPATION:  -Taut bands of the distal most lateral quads fiber anterior to the ITB, 1-2 inches away from patella; very tender initially, but improved with sustained Ischaemic release, multiple twitch areas noted, release following over 4 minutes.   -Rt lateral gluteals: moderately tender   -Rt greater trochanter: mildly tender, not consistent with CC pain   -Rt posterolateral gluteals: mega tender  -Bialt lumbar paras are equally tender   LOWER QUARTER EXAM:   Right eval Left eval  Hip flexion 120 deg*   Hip extension Poor tolerance to zero degrees supine    Hip extension (supine @ 30 deg flexion)  3+/5*  4+/5  Hip abduction 4+/5 4+/5  Hip abduction 45 deg   Hip internal rotation 30*   Hip external rotation 45  LOWER EXTREMITY SPECIAL TESTS:  -External DeRotation Test (Gluteal tendinopathy): Rt negative   FUNCTIONAL TESTS:  5xSTS: 12.28 hands free (mild pain limitations)  GAIT: -fairly antalgic and compensated -abducted RLE  -avoids most normal knee flexion in swing phase -high velocity medium amplitude trendelenburg of Rt hip during loading phase   TODAY'S TREATMENT:                                                                                                                              DATE: 12/22/22  Subjective: Patient reports some soreness in R hip after stretches given last session. Reports 6/10 pain in R hip/knee.    TE: Nustep level 2 x 6 minutes (seat 7) - B UE and LE   Supine ITB stretch 3 x 30 seconds  Sidelying clamshells 2 x 10 with RTB each side   Sit to stand 2 x 10 with 3kg ball Standing hip abduction 2 x 10 with 2# AW each LE with UE support Standing hip extension 2 x 10 with 2# AW each LE with UE support Seated LAQ 2 x 10 each LE 2# AW   PATIENT EDUCATION:   Education details: explained exam findings, association of clinical bursitis with muscle//tendon injury in the literature.  Person educated: Patient, husband  Education method: talk therapy Education comprehension: 7/10   HOME EXERCISE PROGRAM: Access Code: [email or web address] URL: [email or web address] Date: 12/21/2022 Prepared by: Maylon Peppers  Exercises - Supine ITB Stretch  - 2-3 x daily - 5-7 x weekly - 3 sets - 10 reps - Clamshell  - 2-3 x daily - 5-7 x weekly - 3 sets - 10 reps - ITB Stretch at Wall  - 2-3 x daily - 5-7 x weekly - 3 sets - 10 reps - Standing Hip Abduction with Counter Support  - 2-3 x daily - 5-7 x weekly - 3 sets - 10 reps - Standing Hip Extension with Counter Support  - 2-3 x daily - 5-7 x weekly - 3 sets - 10 reps  ASSESSMENT:  CLINICAL IMPRESSION:     Patient arrives to treatment session motivated to participate with complaints of R hip soreness. She has been independent with HEP at home with good tolerance. Session focused on ITB stretching and BLE strengthening. Good tolerance to increase in resistance. Patient will continue to benefit from skilled therapy to address remaining deficits in order to improve quality of life and decrease R LE pain.   OBJECTIVE IMPAIRMENTS: Abnormal gait, cardiopulmonary status limiting activity, decreased activity tolerance, decreased balance, decreased knowledge of use of DME, decreased mobility, difficulty walking, decreased ROM, decreased strength, impaired perceived functional ability, increased muscle spasms, and impaired sensation.   ACTIVITY LIMITATIONS: carrying, lifting, bending, sitting, standing, squatting, sleeping, stairs, transfers, and toileting  PARTICIPATION LIMITATIONS: meal prep, cleaning, laundry, interpersonal relationship, driving, community activity, and occupation  PERSONAL FACTORS: Age, Behavior pattern, Fitness, Past/current experiences, Profession, Time since onset of injury/illness/exacerbation,  and Transportation are also affecting  patient's functional outcome.   REHAB POTENTIAL: Good  CLINICAL DECISION MAKING: Stable/uncomplicated  EVALUATION COMPLEXITY: Moderate   GOALS: Goals reviewed with patient? No  SHORT TERM GOALS: Target date: 01/14/23 Pt to report reduced peak pain in Rt hip during work and housework, <8/10.  Baseline: Goal status: INITIAL  2.  Pt to report compliance with HEP for assist with core motor control and pain management  Baseline:  Goal status: INITIAL  3.  Pt to improve 5xSTS but >1 second Baseline:  Goal status: INITIAL  LONG TERM GOALS: Target date: 01/28/23  KOOS score improvement >20% to reveal reduced disability from hip pain.  Baseline: visit 2  Goal status: INITIAL  2.  Pt to demonstrate performance of >1521ft with device ad lib, without pain increase any >2 points.  Baseline:  Goal status: INITIAL  3.  Pt to demonstrate 5xSTS <11sec to show BLE power output unrestricted by Rt hip pain.  Baseline:  Goal status: INITIAL  4.  Pt to report ability to AMB entry stairs of home in/out without pain increase in RLE.  Baseline:  Goal status: INITIAL  PLAN:  PT FREQUENCY: 1-2x/week   PT DURATION: 6 weeks  PLANNED INTERVENTIONS: 97110-Therapeutic exercises, 97530- Therapeutic activity, [id number]- Neuromuscular re-education, 97535- Self Care, 40981- Manual therapy, [phone]- Gait training, 97014- Electrical stimulation (unattended), [phone]- Electrical stimulation (manual), [id number]- Ultrasound, 21308- Ionotophoresis 4mg /ml Dexamethasone, Patient/Family education, Balance training, Stair training, Taping, Dry Needling, Joint mobilization, Vestibular training, DME instructions, Wheelchair mobility training, and Moist heat  PLAN FOR NEXT SESSION: HEP review, Hip strengthening   Maylon Peppers, PT, DPT  Physical Therapist [facility]  [phone]   Viviann Spare, PT 12/22/2022, 8:45 AM

## 2022-12-21 NOTE — Therapy (Signed)
OUTPATIENT PHYSICAL THERAPY TREATMENT   Patient Name: Erica Woods MRN: [id number] DOB:[birth year], 57 y.o., female Today's Date: 12/21/2022  END OF SESSION:  PT End of Session - 12/21/22 0843     Visit Number 2    Number of Visits 12    Date for PT Re-Evaluation 01/28/23    Authorization Type UHC    Authorization Time Period 12/17/22    Progress Note Due on Visit 10    PT Start Time 0845    PT Stop Time 0925    PT Time Calculation (min) 40 min    Activity Tolerance Patient tolerated treatment well;No increased pain    Behavior During Therapy WFL for tasks assessed/performed              Past Medical History:  Diagnosis Date   Allergic rhinitis    Breast cancer (HCC)    Chickenpox    GERD (gastroesophageal reflux disease)    Headache    Hemorrhoids    History of blood transfusion    Hypercholesteremia    Hyperthyroidism    Past Surgical History:  Procedure Laterality Date   ABDOMINAL SURGERY     APPENDECTOMY     BALLOON DILATION N/A 02/04/2017   Procedure: BALLOON DILATION;  Surgeon: Erica Mood, MD;  Location: [facility] ENDOSCOPY;  Service: Gastroenterology;  Laterality: N/A;   BREAST BIOPSY Left 10/02/2014   Procedure: LEFT BREAST BIOPSY AFTER NEEDLE LOCALIZATION X TWO;  Surgeon: Erica Macho, MD;  Location: [facility];  Service: General;  Laterality: Left;   BREAST LUMPECTOMY Left 2016   benign   COLONOSCOPY N/A 10/23/2014   Procedure: COLONOSCOPY;  Surgeon: Erica Hippo, MD;  Location: [facility];  Service: Endoscopy;  Laterality: N/A;  730   ESOPHAGOGASTRODUODENOSCOPY (EGD) WITH PROPOFOL N/A 02/04/2017   Procedure: ESOPHAGOGASTRODUODENOSCOPY (EGD) WITH PROPOFOL;  Surgeon: Erica Mood, MD;  Location: [facility] ENDOSCOPY;  Service: Gastroenterology;  Laterality: N/A;   ESOPHAGOGASTRODUODENOSCOPY (EGD) WITH PROPOFOL N/A 09/17/2021   Procedure: ESOPHAGOGASTRODUODENOSCOPY (EGD) WITH PROPOFOL;  Surgeon: Erica Mood, MD;  Location: [facility] ENDOSCOPY;  Service: Endoscopy;   Laterality: N/A;   LAPAROSCOPIC APPENDECTOMY  03/02/2012   Procedure: APPENDECTOMY LAPAROSCOPIC;  Surgeon: Erica Bering, MD;  Location: [facility];  Service: General;  Laterality: N/A;   OVARIAN CYST REMOVAL Left    ROOT CANAL  04/07/2022   TUBAL LIGATION     WISDOM TOOTH EXTRACTION Bilateral    Patient Active Problem List   Diagnosis Date Noted   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   Vitamin D deficiency 05/25/2021   Postmenopausal bleeding 11/24/2020   Rash 03/26/2020   Prediabetes 09/20/2018   Stress 11/14/2017   Hot flashes 07/29/2017   Hyperlipidemia 07/29/2017   Hypothyroidism 11/08/2016   Muscle cramps 11/08/2016   Plantar fasciitis, left 07/05/2016   Psoriasis 07/05/2016   Arthralgia of both hands 05/04/2016   History of breast cancer 11/19/2015   Routine general medical examination at a health care facility 05/02/2015   Migraines 11/25/2014   GERD (gastroesophageal reflux disease) 11/25/2014   Atypical lobular hyperplasia [facility]) of left breast 11/22/2014    PCP: Erica Alar, MD   REFERRING PROVIDER: Sherron Ales PA-C, Rheumatology  REFERRING DIAG: "Trochanteric Bursitis of both hips; Right ITB syndrome"  THERAPY DIAG:  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 Pt given a slurry of exercises to perform from her provider.   PERTINENT HISTORY: Erica Woods is a 57yoF who is referred to OPPT by Rheumatology for Rt sided ITBS. Pt reports pain in both Rt lateral knee, as well as Rt posterolateral hip area, although she reports the more proximal of the 2 being  3 years chronic and the latter being ~2-3 months chronic. Pt reports bilat low back pain that is more chronic, she does not feel a part of this current issue. Pt reports Rt hip diagnosis being labeled as bursitis, chart indicates trochanteric. At baseline pt AMB 5-6 day weekly x30 minutes for physical activity, but has not does this since the Summer. 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 Pt takes 600mg  ibuprofen wehn pain is bad, this helps a lot. Pt has never taken PT services for her Right hip issue.   Pain Behavior: Current pain rated 8/10, as bad as 10/10, and as good as 6/10, but pt does not have any complete pain remission. Pt most aggravated by sitting postures and ascending stairs > descending stairs.    PMH: Arthropathy of lumbar facet joint, osteoarthritis of both feet, Osteopenia via DEXA vitamin D deficiency, Lobular carcinoma in situ (LCIS) of left breast,  GERD, HLD, hypoTSH, migraines.  PAIN:  Are you having pain? YES 8/10 Rt hip, Rt knee   PRECAUTIONS: None; osteopenia  WEIGHT BEARING RESTRICTIONS: No  FALLS:  Has patient fallen in last 6 months? N  LIVING ENVIRONMENT: Lives with: Husband, older son 37, works FT  Lives in: Art therapist home 2000sqft Stairs: 8 stairs, 2 rails   OCCUPATION: FT CNA, memory, works nights   PLOF: used to walk 3x/week   PATIENT GOALS:   NEXT MD VISIT: unknown  OBJECTIVE:  Note: Objective measures were completed at Evaluation unless otherwise noted.  DIAGNOSTIC FINDINGS:  Primary osteoarthritis of both feet - Under care of Dr. Logan Woods, arthropathy of lumbar facet joint: No midline spinal tenderness in the lumbar region currently.  No symptoms of radiculopathy.  PATIENT SURVEYS:  *defer HOOS to visit 2  COGNITION: Overall cognitive status: WNL     SENSATION: Reports an area of numbness near the distal most 4 inches of the ITB area exclusively proximal the joint line; Pt reports during remote pregnancy having Rt sided  numbness higher up in the Rt pocket distribution of the lateral femoral cutaneous nerve.   EDEMA:  Minimal to zero edema about the patella, patella ligament, lateral right knee.    PALPATION:  -Taut bands of the distal most lateral quads fiber anterior to the ITB, 1-2 inches away from patella; very tender initially, but improved with sustained Ischaemic release, multiple twitch areas noted, release following over 4 minutes.   -Rt lateral gluteals: moderately tender   -Rt greater trochanter: mildly tender, not consistent with CC pain   -Rt posterolateral gluteals: mega tender  -Bialt lumbar paras are equally tender   LOWER QUARTER EXAM:   Right eval Left eval  Hip flexion 120 deg*   Hip extension Poor tolerance to zero degrees supine    Hip extension (supine @ 30 deg flexion)  3+/5*  4+/5  Hip abduction 4+/5 4+/5  Hip abduction 45 deg   Hip internal rotation 30*   Hip external rotation 45  LOWER EXTREMITY SPECIAL TESTS:  -External DeRotation Test (Gluteal tendinopathy): Rt negative   FUNCTIONAL TESTS:  5xSTS: 12.28 hands free (mild pain limitations)  GAIT: -fairly antalgic and compensated -abducted RLE  -avoids most normal knee flexion in swing phase -high velocity medium amplitude trendelenburg of Rt hip during loading phase   TODAY'S TREATMENT:                                                                                                                              DATE: 12/21/22  Subjective: Patient report just getting off work this AM. Reports 7/10 pain in R hip and knee.   KOOS: 31%  TE: Nustep level 2 x 5 minutes (seat 7) - BLE only  Supine ITB stretch 3 x 30 seconds  Sidelying clamshells 2 x 10  Standing ITB stretch 2 x 30 seconds Standing hip abduction x 10 each LE with UE support Standing hip extension x 10 each LE with UE support  HEP handout provided with the above exercises.   Manual: Unable to tolerate use of tiger tail for STM  PATIENT  EDUCATION:  Education details: explained exam findings, association of clinical bursitis with muscle//tendon injury in the literature.  Person educated: Patient, husband  Education method: talk therapy Education comprehension: 7/10   HOME EXERCISE PROGRAM: Access Code: [email or web address] URL: [email or web address] Date: 12/21/2022 Prepared by: Maylon Peppers  Exercises - Supine ITB Stretch  - 2-3 x daily - 5-7 x weekly - 3 sets - 10 reps - Clamshell  - 2-3 x daily - 5-7 x weekly - 3 sets - 10 reps - ITB Stretch at Wall  - 2-3 x daily - 5-7 x weekly - 3 sets - 10 reps - Standing Hip Abduction with Counter Support  - 2-3 x daily - 5-7 x weekly - 3 sets - 10 reps - Standing Hip Extension with Counter Support  - 2-3 x daily - 5-7 x weekly - 3 sets - 10 reps  ASSESSMENT:  CLINICAL IMPRESSION:    Patient arrives to treatment session with increased in R hip and knee following night of work as Lawyer. Patient scored 31% on KOOS indicative of moderate-severe knee problems and limitations on daily activities and QOL. Session focused on HEP establishment with providing patient with ITB stretches and hip abductor and extensor strengthening. Good tolerance throughout although unable to tolerate manual STM with tiger tail this date due to tenderness. Patient will continue to benefit from skilled therapy to address remaining deficits in order to improve quality of life and decrease R LE pain.   OBJECTIVE IMPAIRMENTS: Abnormal gait, cardiopulmonary status limiting activity, decreased activity tolerance, decreased balance, decreased knowledge of use of DME, decreased mobility, difficulty walking, decreased ROM, decreased strength, impaired perceived functional ability, increased muscle spasms, and impaired sensation.   ACTIVITY LIMITATIONS: carrying, lifting, bending, sitting, standing, squatting, sleeping, stairs, transfers, and toileting  PARTICIPATION LIMITATIONS: meal prep, cleaning, laundry,  interpersonal relationship, driving, community activity, and  occupation  PERSONAL FACTORS: Age, Behavior pattern, Fitness, Past/current experiences, Profession, Time since onset of injury/illness/exacerbation, and Transportation are also affecting patient's functional outcome.   REHAB POTENTIAL: Good  CLINICAL DECISION MAKING: Stable/uncomplicated  EVALUATION COMPLEXITY: Moderate   GOALS: Goals reviewed with patient? No  SHORT TERM GOALS: Target date: 01/14/23 Pt to report reduced peak pain in Rt hip during work and housework, <8/10.  Baseline: Goal status: INITIAL  2.  Pt to report compliance with HEP for assist with core motor control and pain management  Baseline:  Goal status: INITIAL  3.  Pt to improve 5xSTS but >1 second Baseline:  Goal status: INITIAL  LONG TERM GOALS: Target date: 01/28/23  KOOS score improvement >20% to reveal reduced disability from hip pain.  Baseline: visit 2  Goal status: INITIAL  2.  Pt to demonstrate performance of >1529ft with device ad lib, without pain increase any >2 points.  Baseline:  Goal status: INITIAL  3.  Pt to demonstrate 5xSTS <11sec to show BLE power output unrestricted by Rt hip pain.  Baseline:  Goal status: INITIAL  4.  Pt to report ability to AMB entry stairs of home in/out without pain increase in RLE.  Baseline:  Goal status: INITIAL  PLAN:  PT FREQUENCY: 1-2x/week   PT DURATION: 6 weeks  PLANNED INTERVENTIONS: 97110-Therapeutic exercises, 97530- Therapeutic activity, [id number]- Neuromuscular re-education, 97535- Self Care, 78469- Manual therapy, [phone]- Gait training, 97014- Electrical stimulation (unattended), [phone]- Electrical stimulation (manual), [id number]- Ultrasound, 44010- Ionotophoresis 4mg /ml Dexamethasone, Patient/Family education, Balance training, Stair training, Taping, Dry Needling, Joint mobilization, Vestibular training, DME instructions, Wheelchair mobility training, and Moist heat  PLAN FOR NEXT  SESSION: HEP review, hip abductor/extensor strengthening   Maylon Peppers, PT, DPT  Physical Therapist [facility]  [phone]   Viviann Spare, PT 12/21/2022, 8:44 AM

## 2022-12-22 ENCOUNTER — Ambulatory Visit (HOSPITAL_COMMUNITY): Payer: Commercial Managed Care - PPO

## 2022-12-22 ENCOUNTER — Encounter (HOSPITAL_COMMUNITY): Payer: Self-pay

## 2022-12-22 DIAGNOSIS — R262 Difficulty in walking, not elsewhere classified: Secondary | ICD-10-CM

## 2022-12-22 DIAGNOSIS — M7631 Iliotibial band syndrome, right leg: Secondary | ICD-10-CM | POA: Diagnosis not present

## 2022-12-29 ENCOUNTER — Ambulatory Visit (HOSPITAL_COMMUNITY): Payer: Commercial Managed Care - PPO

## 2022-12-29 DIAGNOSIS — R262 Difficulty in walking, not elsewhere classified: Secondary | ICD-10-CM

## 2022-12-29 DIAGNOSIS — M7631 Iliotibial band syndrome, right leg: Secondary | ICD-10-CM

## 2022-12-29 NOTE — Therapy (Addendum)
OUTPATIENT PHYSICAL THERAPY TREATMENT   Patient Name: Erica Woods MRN: [id number] DOB:[birth year], 57 y.o., female Today's Date: 12/29/2022  END OF SESSION:  PT End of Session - 12/29/22 0845     Visit Number 4    Number of Visits 12    Date for PT Re-Evaluation 01/28/23    Authorization Type [employer]    Authorization Time Period 12/17/22    Progress Note Due on Visit 10    PT Start Time 0845    PT Stop Time 0930    PT Time Calculation (min) 45 min    Activity Tolerance Patient tolerated treatment well;No increased pain    Behavior During Therapy WFL for tasks assessed/performed              Past Medical History:  Diagnosis Date   Allergic rhinitis    Breast cancer (HCC)    Chickenpox    GERD (gastroesophageal reflux disease)    Headache    Hemorrhoids    History of blood transfusion    Hypercholesteremia    Hyperthyroidism    Past Surgical History:  Procedure Laterality Date   ABDOMINAL SURGERY     APPENDECTOMY     BALLOON DILATION N/A 02/04/2017   Procedure: BALLOON DILATION;  Surgeon: Wyline Mood, MD;  Location: [facility] ENDOSCOPY;  Service: Gastroenterology;  Laterality: N/A;   BREAST BIOPSY Left 10/02/2014   Procedure: LEFT BREAST BIOPSY AFTER NEEDLE LOCALIZATION X TWO;  Surgeon: Franky Macho, MD;  Location: [facility] ORS;  Service: General;  Laterality: Left;   BREAST LUMPECTOMY Left 2016   benign   COLONOSCOPY N/A 10/23/2014   Procedure: COLONOSCOPY;  Surgeon: Malissa Hippo, MD;  Location: [facility] ENDO SUITE;  Service: Endoscopy;  Laterality: N/A;  730   ESOPHAGOGASTRODUODENOSCOPY (EGD) WITH PROPOFOL N/A 02/04/2017   Procedure: ESOPHAGOGASTRODUODENOSCOPY (EGD) WITH PROPOFOL;  Surgeon: Wyline Mood, MD;  Location: [facility] ENDOSCOPY;  Service: Gastroenterology;  Laterality: N/A;   ESOPHAGOGASTRODUODENOSCOPY (EGD) WITH PROPOFOL N/A 09/17/2021   Procedure: ESOPHAGOGASTRODUODENOSCOPY (EGD) WITH PROPOFOL;  Surgeon: Wyline Mood, MD;  Location: [facility] ENDOSCOPY;  Service: Endoscopy;   Laterality: N/A;   LAPAROSCOPIC APPENDECTOMY  03/02/2012   Procedure: APPENDECTOMY LAPAROSCOPIC;  Surgeon: Fabio Bering, MD;  Location: [facility] ORS;  Service: General;  Laterality: N/A;   OVARIAN CYST REMOVAL Left    ROOT CANAL  04/07/2022   TUBAL LIGATION     WISDOM TOOTH EXTRACTION Bilateral    Patient Active Problem List   Diagnosis Date Noted   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   Vitamin D deficiency 05/25/2021   Postmenopausal bleeding 11/24/2020   Rash 03/26/2020   Prediabetes 09/20/2018   Stress 11/14/2017   Hot flashes 07/29/2017   Hyperlipidemia 07/29/2017   Hypothyroidism 11/08/2016   Muscle cramps 11/08/2016   Plantar fasciitis, left 07/05/2016   Psoriasis 07/05/2016   Arthralgia of both hands 05/04/2016   History of breast cancer 11/19/2015   Routine general medical examination at a health care facility 05/02/2015   Migraines 11/25/2014   GERD (gastroesophageal reflux disease) 11/25/2014   Atypical lobular hyperplasia [facility]) of left breast 11/22/2014    PCP: Marikay Alar, MD   REFERRING PROVIDER: Sherron Ales PA-C, Rheumatology  REFERRING DIAG: "Trochanteric Bursitis of both hips; Right ITB syndrome"  THERAPY DIAG:  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 Can tell her knee pain in exacerbated when moving patients. Has been keeping up with her HEP. Did it today before PT. Able to touch hip and knee with less pain. Feels that exercises are working well. Recently 6/10 pain with activity.    PERTINENT HISTORY: Erica Woods is a 57yoF who is referred to OPPT by Rheumatology for Rt sided ITBS. Pt reports pain in both Rt lateral knee, as well as Rt  posterolateral hip area, although she reports the more proximal of the 2 being 3 years chronic and the latter being ~2-3 months chronic. Pt reports bilat low back pain that is more chronic, she does not feel a part of this current issue. Pt reports Rt hip diagnosis being labeled as bursitis, chart indicates trochanteric. At baseline pt AMB 5-6 day weekly x30 minutes for physical activity, but has not does this since the Summer. 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 Pt takes 600mg  ibuprofen wehn pain is bad, this helps a lot. Pt has never taken PT services for her Right hip issue.   Pain Behavior: Current pain rated 8/10, as bad as 10/10, and as good as 6/10, but pt does not have any complete pain remission. Pt most aggravated by sitting postures and ascending stairs > descending stairs.    PMH: Arthropathy of lumbar facet joint, osteoarthritis of both feet, Osteopenia via DEXA vitamin D deficiency, Lobular carcinoma in situ (LCIS) of left breast,  GERD, HLD, hypoTSH, migraines.  PAIN:  Are you having pain? YES 8/10 Rt hip, Rt knee   PRECAUTIONS: None; osteopenia  WEIGHT BEARING RESTRICTIONS: No  FALLS:  Has patient fallen in last 6 months? N  LIVING ENVIRONMENT: Lives with: Husband, older son 23, works FT  Lives in: Art therapist home 2000sqft Stairs: 8 stairs, 2 rails   OCCUPATION: FT CNA, memory, works nights   PLOF: used to walk 3x/week   PATIENT GOALS:   NEXT MD VISIT: unknown  OBJECTIVE:  Note: Objective measures were completed at Evaluation unless otherwise noted.  DIAGNOSTIC FINDINGS:  Primary osteoarthritis of both feet - Under care of Dr. Logan Bores, arthropathy of lumbar facet joint: No midline spinal tenderness in the lumbar region currently.  No symptoms of radiculopathy.  PATIENT SURVEYS:  *defer HOOS to visit 2  COGNITION: Overall cognitive status: WNL     SENSATION: Reports an area of numbness near the distal most 4 inches of the ITB area exclusively  proximal the joint line; Pt reports during remote pregnancy having Rt sided numbness higher up in the Rt pocket distribution of the lateral femoral cutaneous nerve.   EDEMA:  Minimal to zero edema about the patella, patella ligament, lateral right knee.    PALPATION:  -Taut bands of the distal most lateral quads fiber anterior to the ITB, 1-2 inches away from patella; very tender initially, but improved with sustained Ischaemic release, multiple twitch areas noted, release following over 4 minutes.   -Rt lateral gluteals: moderately tender   -Rt greater trochanter: mildly tender, not consistent with CC pain   -Rt posterolateral gluteals: mega tender  -Bialt lumbar paras are equally tender   LOWER QUARTER EXAM:   Right eval Left eval  Hip flexion 120 deg*   Hip extension Poor tolerance to zero degrees supine    Hip extension (supine @ 30 deg flexion)  3+/5*  4+/5  Hip abduction 4+/5 4+/5  Hip abduction 45  deg   Hip internal rotation 30*   Hip external rotation 45     LOWER EXTREMITY SPECIAL TESTS:  -External DeRotation Test (Gluteal tendinopathy): Rt negative   FUNCTIONAL TESTS:  5xSTS: 12.28 hands free (mild pain limitations)  GAIT: -fairly antalgic and compensated -abducted RLE  -avoids most normal knee flexion in swing phase -high velocity medium amplitude trendelenburg of Rt hip during loading phase   TODAY'S TREATMENT:                                                                                                                              DATE: 12/29/22  TE: 12/29/22 Nustep level 2 x 8 minutes (seat 6) - B UE and LE   Supine ITB stretch 3 x 30 seconds  Sidelying clamshells 2 x 10 with RTB each side   Seated LAQ 3 x 10 each LE 3# AW  Sit to stand 2 x 15  Patient education on activity modifications for moving patients in a [place] lift.    TE: 12/22/22 Nustep level 2 x 6 minutes (seat 7) - B UE and LE   Supine ITB stretch 3 x 30 seconds  Sidelying clamshells 2 x  10 with RTB each side   Sit to stand 2 x 10 with 3kg ball Standing hip abduction 2 x 10 with 2# AW each LE with UE support Standing hip extension 2 x 10 with 2# AW each LE with UE support Seated LAQ 2 x 10 each LE 2# AW   PATIENT EDUCATION:  Education details: explained exam findings, association of clinical bursitis with muscle//tendon injury in the literature.  Person educated: Patient, husband  Education method: talk therapy Education comprehension: 7/10   HOME EXERCISE PROGRAM: Access Code: [email or web address] URL: [email or web address] Date: 12/21/2022 Prepared by: Maylon Peppers  Exercises - Supine ITB Stretch  - 2-3 x daily - 5-7 x weekly - 3 sets - 10 reps - Clamshell  - 2-3 x daily - 5-7 x weekly - 3 sets - 10 reps - ITB Stretch at Wall  - 2-3 x daily - 5-7 x weekly - 3 sets - 10 reps - Standing Hip Abduction with Counter Support  - 2-3 x daily - 5-7 x weekly - 3 sets - 10 reps - Standing Hip Extension with Counter Support  - 2-3 x daily - 5-7 x weekly - 3 sets - 10 reps  ASSESSMENT:  CLINICAL IMPRESSION:     Patient arrives to treatment session motivated to participate with complaints of R hip and knee soreness. She has been independent with HEP at home with good tolerance. Session focused on ITB stretching and BLE strengthening. When doing sit to stand exercise, there was audible crepitus coming from right knee. Further examination found that the right patella was limited in superior and medial glides. Painful to lateral, medial, and superior glide. Exaggerated pain at inferior poll of patella. Positive patellar tilt test as well. Patient educated on likelihood of  Patellar femoral pain syndrome and patellar tendinopathy as contributing factors to her pain as well as prognosis. She tolerated increases in resistance well. Patient will continue to benefit from skilled therapy to address remaining deficits in order to improve quality of life and decrease R LE pain.   OBJECTIVE  IMPAIRMENTS: Abnormal gait, cardiopulmonary status limiting activity, decreased activity tolerance, decreased balance, decreased knowledge of use of DME, decreased mobility, difficulty walking, decreased ROM, decreased strength, impaired perceived functional ability, increased muscle spasms, and impaired sensation.   ACTIVITY LIMITATIONS: carrying, lifting, bending, sitting, standing, squatting, sleeping, stairs, transfers, and toileting  PARTICIPATION LIMITATIONS: meal prep, cleaning, laundry, interpersonal relationship, driving, community activity, and occupation  PERSONAL FACTORS: Age, Behavior pattern, Fitness, Past/current experiences, Profession, Time since onset of injury/illness/exacerbation, and Transportation are also affecting patient's functional outcome.   REHAB POTENTIAL: Good  CLINICAL DECISION MAKING: Stable/uncomplicated  EVALUATION COMPLEXITY: Moderate   GOALS: Goals reviewed with patient? No  SHORT TERM GOALS: Target date: 01/14/23 Pt to report reduced peak pain in Rt hip during work and housework, <8/10.  Baseline: Goal status: INITIAL  2.  Pt to report compliance with HEP for assist with core motor control and pain management  Baseline:  Goal status: INITIAL  3.  Pt to improve 5xSTS but >1 second Baseline:  Goal status: INITIAL  LONG TERM GOALS: Target date: 01/28/23  KOOS score improvement >20% to reveal reduced disability from hip pain.  Baseline: visit 2  Goal status: INITIAL  2.  Pt to demonstrate performance of >1543ft with device ad lib, without pain increase any >2 points.  Baseline:  Goal status: INITIAL  3.  Pt to demonstrate 5xSTS <11sec to show BLE power output unrestricted by Rt hip pain.  Baseline:  Goal status: INITIAL  4.  Pt to report ability to AMB entry stairs of home in/out without pain increase in RLE.  Baseline:  Goal status: INITIAL  PLAN:  PT FREQUENCY: 1-2x/week   PT DURATION: 6 weeks  PLANNED INTERVENTIONS:  97110-Therapeutic exercises, 97530- Therapeutic activity, [id number]- Neuromuscular re-education, 97535- Self Care, [id number]- Manual therapy, [id number]- Gait training, 97014- Electrical stimulation (unattended), [id number]- Electrical stimulation (manual), [id number]- Ultrasound, 42595- Ionotophoresis 4mg /ml Dexamethasone, Patient/Family education, Balance training, Stair training, Taping, Dry Needling, Joint mobilization, Vestibular training, DME instructions, Wheelchair mobility training, and Moist heat  PLAN FOR NEXT SESSION:  Hip strengthening: standing exercises. Knee strengthening: SAQ and heel raises. Patellar mobilizations to decrease pain.    Chas Amarah Brossman, Student-PT 12/29/2022, 8:52 AM   " I agree with the following treatment note after reviewing documentation. This session was performed under the supervision of a licensed clinician."      11:30 AM, 12/29/22 Amy Small Lynch MPT [place] [facility] [place] [phone]

## 2022-12-31 ENCOUNTER — Ambulatory Visit (HOSPITAL_COMMUNITY): Payer: Commercial Managed Care - PPO

## 2022-12-31 DIAGNOSIS — M7631 Iliotibial band syndrome, right leg: Secondary | ICD-10-CM

## 2022-12-31 DIAGNOSIS — R262 Difficulty in walking, not elsewhere classified: Secondary | ICD-10-CM

## 2022-12-31 NOTE — Therapy (Cosign Needed)
OUTPATIENT PHYSICAL THERAPY TREATMENT   Patient Name: Erica Woods MRN: [id number] DOB:[birth year], 57 y.o., female Today's Date: 12/31/2022  END OF SESSION:  PT End of Session - 12/31/22 0856     Visit Number 5    Number of Visits 12    Date for PT Re-Evaluation 01/28/23    Authorization Type [employer]    Authorization Time Period 12/17/22    Progress Note Due on Visit 10    PT Start Time 0850    PT Stop Time 0930    PT Time Calculation (min) 40 min    Activity Tolerance Patient tolerated treatment well;No increased pain    Behavior During Therapy WFL for tasks assessed/performed              Past Medical History:  Diagnosis Date   Allergic rhinitis    Breast cancer (HCC)    Chickenpox    GERD (gastroesophageal reflux disease)    Headache    Hemorrhoids    History of blood transfusion    Hypercholesteremia    Hyperthyroidism    Past Surgical History:  Procedure Laterality Date   ABDOMINAL SURGERY     APPENDECTOMY     BALLOON DILATION N/A 02/04/2017   Procedure: BALLOON DILATION;  Surgeon: Wyline Mood, MD;  Location: [facility] ENDOSCOPY;  Service: Gastroenterology;  Laterality: N/A;   BREAST BIOPSY Left 10/02/2014   Procedure: LEFT BREAST BIOPSY AFTER NEEDLE LOCALIZATION X TWO;  Surgeon: Franky Macho, MD;  Location: [facility] ORS;  Service: General;  Laterality: Left;   BREAST LUMPECTOMY Left 2016   benign   COLONOSCOPY N/A 10/23/2014   Procedure: COLONOSCOPY;  Surgeon: Malissa Hippo, MD;  Location: [facility] ENDO SUITE;  Service: Endoscopy;  Laterality: N/A;  730   ESOPHAGOGASTRODUODENOSCOPY (EGD) WITH PROPOFOL N/A 02/04/2017   Procedure: ESOPHAGOGASTRODUODENOSCOPY (EGD) WITH PROPOFOL;  Surgeon: Wyline Mood, MD;  Location: [facility] ENDOSCOPY;  Service: Gastroenterology;  Laterality: N/A;   ESOPHAGOGASTRODUODENOSCOPY (EGD) WITH PROPOFOL N/A 09/17/2021   Procedure: ESOPHAGOGASTRODUODENOSCOPY (EGD) WITH PROPOFOL;  Surgeon: Wyline Mood, MD;  Location: [facility] ENDOSCOPY;  Service: Endoscopy;   Laterality: N/A;   LAPAROSCOPIC APPENDECTOMY  03/02/2012   Procedure: APPENDECTOMY LAPAROSCOPIC;  Surgeon: Fabio Bering, MD;  Location: [facility] ORS;  Service: General;  Laterality: N/A;   OVARIAN CYST REMOVAL Left    ROOT CANAL  04/07/2022   TUBAL LIGATION     WISDOM TOOTH EXTRACTION Bilateral    Patient Active Problem List   Diagnosis Date Noted   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   Vitamin D deficiency 05/25/2021   Postmenopausal bleeding 11/24/2020   Rash 03/26/2020   Prediabetes 09/20/2018   Stress 11/14/2017   Hot flashes 07/29/2017   Hyperlipidemia 07/29/2017   Hypothyroidism 11/08/2016   Muscle cramps 11/08/2016   Plantar fasciitis, left 07/05/2016   Psoriasis 07/05/2016   Arthralgia of both hands 05/04/2016   History of breast cancer 11/19/2015   Routine general medical examination at a health care facility 05/02/2015   Migraines 11/25/2014   GERD (gastroesophageal reflux disease) 11/25/2014   Atypical lobular hyperplasia [facility]) of left breast 11/22/2014    PCP: Marikay Alar, MD   REFERRING PROVIDER: Sherron Ales PA-C, Rheumatology  REFERRING DIAG: "Trochanteric Bursitis of both hips; Right ITB syndrome"  THERAPY DIAG:  Difficulty in walking, not elsewhere classified  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 Not too much but felt fatigued for a couple hours after. Has been keeping up with her HEP. Tested new positions with the [place] transfer lift and noticed less difficulty with better leverage. Feels that exercises are working well. Recently 6/10 pain with activity in her knee.    PERTINENT HISTORY: Erica Woods is a 57yoF who is referred to OPPT by Rheumatology for Rt sided ITBS. Pt reports pain  in both Rt lateral knee, as well as Rt posterolateral hip area, although she reports the more proximal of the 2 being 3 years chronic and the latter being ~2-3 months chronic. Pt reports bilat low back pain that is more chronic, she does not feel a part of this current issue. Pt reports Rt hip diagnosis being labeled as bursitis, chart indicates trochanteric. At baseline pt AMB 5-6 day weekly x30 minutes for physical activity, but has not does this since the Summer. 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 Pt takes 600mg  ibuprofen wehn pain is bad, this helps a lot. Pt has never taken PT services for her Right hip issue.   Pain Behavior: Current pain rated 8/10, as bad as 10/10, and as good as 6/10, but pt does not have any complete pain remission. Pt most aggravated by sitting postures and ascending stairs > descending stairs.    PMH: Arthropathy of lumbar facet joint, osteoarthritis of both feet, Osteopenia via DEXA vitamin D deficiency, Lobular carcinoma in situ (LCIS) of left breast,  GERD, HLD, hypoTSH, migraines.  PAIN:  Are you having pain? YES 8/10 Rt hip, Rt knee   PRECAUTIONS: None; osteopenia  WEIGHT BEARING RESTRICTIONS: No  FALLS:  Has patient fallen in last 6 months? N  LIVING ENVIRONMENT: Lives with: Husband, older son 36, works FT  Lives in: Art therapist home 2000sqft Stairs: 8 stairs, 2 rails   OCCUPATION: FT CNA, memory, works nights   PLOF: used to walk 3x/week   PATIENT GOALS:   NEXT MD VISIT: unknown  OBJECTIVE:  Note: Objective measures were completed at Evaluation unless otherwise noted.  DIAGNOSTIC FINDINGS:  Primary osteoarthritis of both feet - Under care of Dr. Logan Bores, arthropathy of lumbar facet joint: No midline spinal tenderness in the lumbar region currently.  No symptoms of radiculopathy.  PATIENT SURVEYS:  *defer HOOS to visit 2  COGNITION: Overall cognitive status: WNL     SENSATION: Reports an area of numbness near the distal most 4  inches of the ITB area exclusively proximal the joint line; Pt reports during remote pregnancy having Rt sided numbness higher up in the Rt pocket distribution of the lateral femoral cutaneous nerve.   EDEMA:  Minimal to zero edema about the patella, patella ligament, lateral right knee.    PALPATION:  -Taut bands of the distal most lateral quads fiber anterior to the ITB, 1-2 inches away from patella; very tender initially, but improved with sustained Ischaemic release, multiple twitch areas noted, release following over 4 minutes.   -Rt lateral gluteals: moderately tender   -Rt greater trochanter: mildly tender, not consistent with CC pain   -Rt posterolateral gluteals: mega tender  -Bialt lumbar paras are equally tender   LOWER QUARTER EXAM:   Right eval Left eval  Hip flexion 120 deg*   Hip extension Poor tolerance to zero degrees supine    Hip extension (supine @ 30 deg flexion)  3+/5*  4+/5  Hip abduction 4+/5 4+/5  Hip abduction 45 deg   Hip internal rotation 30*   Hip external rotation 45     LOWER EXTREMITY SPECIAL TESTS:  -External DeRotation Test (Gluteal tendinopathy): Rt negative   FUNCTIONAL TESTS:  5xSTS: 12.28 hands free (mild pain limitations)  GAIT: -fairly antalgic and compensated -abducted RLE  -avoids most normal knee flexion in swing phase -high velocity medium amplitude trendelenburg of Rt hip during loading phase   TODAY'S TREATMENT:                                                                                                                              DATE: 12/31/22  TE: 12/31/22 Supine ITB stretch 3 x 30 seconds  Sidelying clamshells 3 x 10 with RTB each side   Seated LAQ 2 x 15 each LE 3# AW  STS x 10 - painful  Mini squat iso 15 sec x 2 - pain free Mini lunge iso 15 sec x 2 - pain free  Heel raises x15 double leg, x10 single leg   TE: 12/29/22 Nustep level 2 x 8 minutes (seat 6) - B UE and LE   Supine ITB stretch 3 x 30 seconds   Sidelying clamshells 2 x 10 with RTB each side   Seated LAQ 3 x 10 each LE 3# AW  Sit to stand 2 x 15  Patient education on activity modifications for moving patients in a [place] lift.    TE: 12/22/22 Nustep level 2 x 6 minutes (seat 7) - B UE and LE   Supine ITB stretch 3 x 30 seconds  Sidelying clamshells 2 x 10 with RTB each side   Sit to stand 2 x 10 with 3kg ball Standing hip abduction 2 x 10 with 2# AW each LE with UE support Standing hip extension 2 x 10 with 2# AW each LE with UE support Seated LAQ 2 x 10 each LE 2# AW   PATIENT EDUCATION:  Education details: explained exam findings, association of clinical bursitis with muscle//tendon injury in the literature.  Person educated: Patient, husband  Education method: talk therapy Education comprehension: 7/10   HOME EXERCISE PROGRAM: Access Code: [email or web address] URL: [email or web address] Date: 12/21/2022 Prepared by: Maylon Peppers  Exercises - Supine ITB Stretch  - 2-3 x daily - 5-7 x weekly - 3 sets - 10 reps - Clamshell  - 2-3 x daily - 5-7 x weekly - 3 sets - 10 reps - ITB Stretch at Wall  - 2-3 x daily - 5-7 x weekly - 3 sets - 10 reps - Standing Hip Abduction with Counter Support  - 2-3 x daily - 5-7 x weekly - 3 sets - 10 reps - Standing Hip Extension with Counter Support  - 2-3 x daily - 5-7 x weekly - 3 sets - 10 reps  ASSESSMENT:  CLINICAL IMPRESSION:     Patient has been independent with HEP at home with good tolerance. Today's session focused on ITB stretching  and BLE strengthening. When doing sit to stand exercise and LAQ, there was audible crepitus coming from mainly the right knee. Further exercise testing revealed that repeated squats increased pain in the knee, but a mini squat hold had an analgesic affect while still fatiguing her quads. Repeated mini squat isometrics were pain free. This presentation further indicates a comorbidity of patellar tendinopathy as contributing factors to her knee pain.  She generally tolerated increases in resistance well. Patient will continue to benefit from skilled therapy to address remaining deficits in order to improve quality of life and decrease R LE pain.   OBJECTIVE IMPAIRMENTS: Abnormal gait, cardiopulmonary status limiting activity, decreased activity tolerance, decreased balance, decreased knowledge of use of DME, decreased mobility, difficulty walking, decreased ROM, decreased strength, impaired perceived functional ability, increased muscle spasms, and impaired sensation.   ACTIVITY LIMITATIONS: carrying, lifting, bending, sitting, standing, squatting, sleeping, stairs, transfers, and toileting  PARTICIPATION LIMITATIONS: meal prep, cleaning, laundry, interpersonal relationship, driving, community activity, and occupation  PERSONAL FACTORS: Age, Behavior pattern, Fitness, Past/current experiences, Profession, Time since onset of injury/illness/exacerbation, and Transportation are also affecting patient's functional outcome.   REHAB POTENTIAL: Good  CLINICAL DECISION MAKING: Stable/uncomplicated  EVALUATION COMPLEXITY: Moderate   GOALS: Goals reviewed with patient? No  SHORT TERM GOALS: Target date: 01/14/23 Pt to report reduced peak pain in Rt hip during work and housework, <8/10.  Baseline: Goal status: INITIAL  2.  Pt to report compliance with HEP for assist with core motor control and pain management  Baseline:  Goal status: INITIAL  3.  Pt to improve 5xSTS but >1 second Baseline:  Goal status: INITIAL  LONG TERM GOALS: Target date: 01/28/23  KOOS score improvement >20% to reveal reduced disability from hip pain.  Baseline: visit 2  Goal status: INITIAL  2.  Pt to demonstrate performance of >1572ft with device ad lib, without pain increase any >2 points.  Baseline:  Goal status: INITIAL  3.  Pt to demonstrate 5xSTS <11sec to show BLE power output unrestricted by Rt hip pain.  Baseline:  Goal status: INITIAL  4.   Pt to report ability to AMB entry stairs of home in/out without pain increase in RLE.  Baseline:  Goal status: INITIAL  PLAN:  PT FREQUENCY: 1-2x/week   PT DURATION: 6 weeks  PLANNED INTERVENTIONS: 97110-Therapeutic exercises, 97530- Therapeutic activity, [id number]- Neuromuscular re-education, 97535- Self Care, [id number]- Manual therapy, [id number]- Gait training, 97014- Electrical stimulation (unattended), [phone]- Electrical stimulation (manual), [id number]- Ultrasound, 30865- Ionotophoresis 4mg /ml Dexamethasone, Patient/Family education, Balance training, Stair training, Taping, Dry Needling, Joint mobilization, Vestibular training, DME instructions, Wheelchair mobility training, and Moist heat  PLAN FOR NEXT SESSION:  Hip strengthening: standing exercises with lateral movement. Knee strengthening: SAQ, heel raises, and mini squats. Test isometric variations of exercises for further analgesic effect. Patellar mobilizations to decrease pain.   8:58 AM, 12/31/22 Karna Christmas, SPT  " I agree with the following treatment note after reviewing documentation. This session was performed under the supervision of a licensed clinician."    9:19 AM, 01/01/23 Amy Small Lynch MPT [facility] [place] [phone]

## 2023-01-03 ENCOUNTER — Ambulatory Visit (HOSPITAL_COMMUNITY): Payer: Commercial Managed Care - PPO

## 2023-01-03 DIAGNOSIS — M7631 Iliotibial band syndrome, right leg: Secondary | ICD-10-CM

## 2023-01-03 DIAGNOSIS — R262 Difficulty in walking, not elsewhere classified: Secondary | ICD-10-CM

## 2023-01-03 NOTE — Therapy (Cosign Needed)
OUTPATIENT PHYSICAL THERAPY TREATMENT   Patient Name: Erica Woods MRN: [id number] DOB:Sep 20, 1965, 57 y.o., female Today's Date: 01/03/2023  END OF SESSION:  PT End of Session - 01/03/23 0851     Visit Number 6    Number of Visits 12    Date for PT Re-Evaluation 01/28/23    Authorization Type [employer]    Authorization Time Period 12/17/22    Progress Note Due on Visit 10    PT Start Time 0849    PT Stop Time 0930    PT Time Calculation (min) 41 min    Activity Tolerance Patient tolerated treatment well;No increased pain    Behavior During Therapy WFL for tasks assessed/performed              Past Medical History:  Diagnosis Date   Allergic rhinitis    Breast cancer (HCC)    Chickenpox    GERD (gastroesophageal reflux disease)    Headache    Hemorrhoids    History of blood transfusion    Hypercholesteremia    Hyperthyroidism    Past Surgical History:  Procedure Laterality Date   ABDOMINAL SURGERY     APPENDECTOMY     BALLOON DILATION N/A 02/04/2017   Procedure: BALLOON DILATION;  Surgeon: Erica Mood, MD;  Location: [facility] ENDOSCOPY;  Service: Gastroenterology;  Laterality: N/A;   BREAST BIOPSY Left 10/02/2014   Procedure: LEFT BREAST BIOPSY AFTER NEEDLE LOCALIZATION X TWO;  Surgeon: Erica Macho, MD;  Location: [facility] ORS;  Service: General;  Laterality: Left;   BREAST LUMPECTOMY Left 2016   benign   COLONOSCOPY N/A 10/23/2014   Procedure: COLONOSCOPY;  Surgeon: Erica Hippo, MD;  Location: [facility] SUITE;  Service: Endoscopy;  Laterality: N/A;  730   ESOPHAGOGASTRODUODENOSCOPY (EGD) WITH PROPOFOL N/A 02/04/2017   Procedure: ESOPHAGOGASTRODUODENOSCOPY (EGD) WITH PROPOFOL;  Surgeon: Erica Mood, MD;  Location: [facility] ENDOSCOPY;  Service: Gastroenterology;  Laterality: N/A;   ESOPHAGOGASTRODUODENOSCOPY (EGD) WITH PROPOFOL N/A 09/17/2021   Procedure: ESOPHAGOGASTRODUODENOSCOPY (EGD) WITH PROPOFOL;  Surgeon: Erica Mood, MD;  Location: [facility] ENDOSCOPY;  Service: Endoscopy;   Laterality: N/A;   LAPAROSCOPIC APPENDECTOMY  03/02/2012   Procedure: APPENDECTOMY LAPAROSCOPIC;  Surgeon: Erica Bering, MD;  Location: [facility] ORS;  Service: General;  Laterality: N/A;   OVARIAN CYST REMOVAL Left    ROOT CANAL  04/07/2022   TUBAL LIGATION     WISDOM TOOTH EXTRACTION Bilateral    Patient Active Problem List   Diagnosis Date Noted   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   Vitamin D deficiency 05/25/2021   Postmenopausal bleeding 11/24/2020   Rash 03/26/2020   Prediabetes 09/20/2018   Stress 11/14/2017   Hot flashes 07/29/2017   Hyperlipidemia 07/29/2017   Hypothyroidism 11/08/2016   Muscle cramps 11/08/2016   Plantar fasciitis, left 07/05/2016   Psoriasis 07/05/2016   Arthralgia of both hands 05/04/2016   History of breast cancer 11/19/2015   Routine general medical examination at a health care facility 05/02/2015   Migraines 11/25/2014   GERD (gastroesophageal reflux disease) 11/25/2014   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 She tried to push off toes and slipped; felt her knee pull. Took some ibuprofen to help. Was able to do exercises the next morning without pain. Denies any pain this morning.    PERTINENT HISTORY: Erica Woods is a 57yoF who is referred to OPPT by Rheumatology for Rt sided ITBS. Pt reports pain in both Rt lateral knee, as well as Rt posterolateral hip  area, although she reports the more proximal of the 2 being 3 years chronic and the latter being ~2-3 months chronic. Pt reports bilat low back pain that is more chronic, she does not feel a part of this current issue. Pt reports Rt hip diagnosis being labeled as bursitis, chart indicates trochanteric. At baseline pt AMB 5-6 day weekly x30 minutes for physical activity, but has not does this since the Summer. 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 Pt takes 600mg  ibuprofen wehn pain is bad, this helps a lot. Pt has never taken PT services for her Right hip issue.   Pain Behavior: Current pain rated 8/10, as bad as 10/10, and as good as 6/10, but pt does not have any complete pain remission. Pt most aggravated by sitting postures and ascending stairs > descending stairs.    PMH: Arthropathy of lumbar facet joint, osteoarthritis of both feet, Osteopenia via DEXA vitamin D deficiency, Lobular carcinoma in situ (LCIS) of left breast,  GERD, HLD, hypoTSH, migraines.  PAIN:  Are you having pain? YES 8/10 Rt hip, Rt knee   PRECAUTIONS: None; osteopenia  WEIGHT BEARING RESTRICTIONS: No  FALLS:  Has patient fallen in last 6 months? N  LIVING ENVIRONMENT: Lives with: Husband, older son 55, works FT  Lives in: Art therapist home 2000sqft Stairs: 8 stairs, 2 rails   OCCUPATION: FT CNA, memory, works nights   PLOF: used to walk 3x/week   PATIENT GOALS:   NEXT MD VISIT: unknown  OBJECTIVE:  Note: Objective measures were completed at Evaluation unless otherwise noted.  DIAGNOSTIC FINDINGS:  Primary osteoarthritis of both feet - Under care of Dr. Logan Woods, arthropathy of lumbar facet joint: No midline spinal tenderness in the lumbar region currently.  No symptoms of radiculopathy.  PATIENT SURVEYS:  *defer HOOS to visit 2  COGNITION: Overall cognitive status: WNL     SENSATION: Reports an area of numbness near the distal most 4 inches of the ITB area exclusively proximal the joint  line; Pt reports during remote pregnancy having Rt sided numbness higher up in the Rt pocket distribution of the lateral femoral cutaneous nerve.   EDEMA:  Minimal to zero edema about the patella, patella ligament, lateral right knee.    PALPATION:  -Taut bands of the distal most lateral quads fiber anterior to the ITB, 1-2 inches away from patella; very tender initially, but improved with sustained Ischaemic release, multiple twitch areas noted, release following over 4 minutes.   -Rt lateral gluteals: moderately tender   -Rt greater trochanter: mildly tender, not consistent with CC pain   -Rt posterolateral gluteals: mega tender  -Bialt lumbar paras are equally tender   LOWER QUARTER EXAM:   Right eval Left eval  Hip flexion 120 deg*   Hip extension Poor tolerance to zero degrees supine    Hip extension (supine @ 30 deg flexion)  3+/5*  4+/5  Hip abduction 4+/5 4+/5  Hip abduction 45 deg   Hip internal  rotation 30*   Hip external rotation 45     LOWER EXTREMITY SPECIAL TESTS:  -External DeRotation Test (Gluteal tendinopathy): Rt negative   FUNCTIONAL TESTS:  5xSTS: 12.28 hands free (mild pain limitations)  GAIT: -fairly antalgic and compensated -abducted RLE  -avoids most normal knee flexion in swing phase -high velocity medium amplitude trendelenburg of Rt hip during loading phase   TODAY'S TREATMENT:                                                                                                                              DATE: 01/03/23  TE: 12/31/22 NuStep 6' level 3 seat 7 Supine ITB stretch 3 x 30 seconds  Sidelying clamshells 3 x 10 with RTB each side   Seated LAQ 3 x 8 each LE 4# AW and 3 sec hold Wall sit at 135 degrees of knee flexion 3 x 20" - felt better each round  Side steps 10 steps each direction 2x - pain went up Calf stretch on slant board 3 x 30"   TE: 12/31/22 Supine ITB stretch 3 x 30 seconds  Sidelying clamshells 3 x 10 with RTB each side    Seated LAQ 2 x 15 each LE 3# AW  STS x 10 - painful  Mini squat iso 15 sec x 2 - pain free Mini lunge iso 15 sec x 2 - pain free  Heel raises x15 double leg, x10 single leg   TE: 12/29/22 Nustep level 2 x 8 minutes (seat 6) - B UE and LE   Supine ITB stretch 3 x 30 seconds  Sidelying clamshells 2 x 10 with RTB each side   Seated LAQ 3 x 10 each LE 3# AW  Sit to stand 2 x 15  Patient education on activity modifications for moving patients in a Unity lift.    TE: 12/22/22 Nustep level 2 x 6 minutes (seat 7) - B UE and LE   Supine ITB stretch 3 x 30 seconds  Sidelying clamshells 2 x 10 with RTB each side   Sit to stand 2 x 10 with 3kg ball Standing hip abduction 2 x 10 with 2# AW each LE with UE support Standing hip extension 2 x 10 with 2# AW each LE with UE support Seated LAQ 2 x 10 each LE 2# AW   PATIENT EDUCATION:  Education details: explained exam findings, association of clinical bursitis with muscle//tendon injury in the literature.  Person educated: Patient, husband  Education method: talk therapy Education comprehension: 7/10   HOME EXERCISE PROGRAM: Access Code: [email or web address] URL: [email or web address] Date: 12/21/2022 Prepared by: Maylon Peppers  Exercises - Supine ITB Stretch  - 2-3 x daily - 5-7 x weekly - 3 sets - 10 reps - Clamshell  - 2-3 x daily - 5-7 x weekly - 3 sets - 10 reps - ITB Stretch at Wall  - 2-3 x daily - 5-7 x weekly - 3 sets - 10 reps -  Standing Hip Abduction with Counter Support  - 2-3 x daily - 5-7 x weekly - 3 sets - 10 reps - Standing Hip Extension with Counter Support  - 2-3 x daily - 5-7 x weekly - 3 sets - 10 reps  ASSESSMENT:  CLINICAL IMPRESSION:     Despite tweaking her knee this weekend, patient is still progressing well and did not experience a set back. She has been able to keep up with her HEP 1-2x a day and notices improvements. Today patient was able to progress LAQ to 4 pounds with an isometric pause. Introducing a  mini wall sit provided a good strength challenge with less knee pain as each round progressed. Side stepping required min to mod verbal cues and demonstrations throughout for proper posture and technique. Knee pain elevated after second round of side steps. Patient was able to tolerate today's adjustments well with appropriate levels of fatigue. She continues to benefit from isometric exercises and LE exercise that don't excessively increase joint pressure. Patient will continue to benefit from skilled therapy to address remaining deficits in order to improve quality of life and decrease R LE pain.    OBJECTIVE IMPAIRMENTS: Abnormal gait, cardiopulmonary status limiting activity, decreased activity tolerance, decreased balance, decreased knowledge of use of DME, decreased mobility, difficulty walking, decreased ROM, decreased strength, impaired perceived functional ability, increased muscle spasms, and impaired sensation.   ACTIVITY LIMITATIONS: carrying, lifting, bending, sitting, standing, squatting, sleeping, stairs, transfers, and toileting  PARTICIPATION LIMITATIONS: meal prep, cleaning, laundry, interpersonal relationship, driving, community activity, and occupation  PERSONAL FACTORS: Age, Behavior pattern, Fitness, Past/current experiences, Profession, Time since onset of injury/illness/exacerbation, and Transportation are also affecting patient's functional outcome.   REHAB POTENTIAL: Good  CLINICAL DECISION MAKING: Stable/uncomplicated  EVALUATION COMPLEXITY: Moderate   GOALS: Goals reviewed with patient? No  SHORT TERM GOALS: Target date: 01/14/23 Pt to report reduced peak pain in Rt hip during work and housework, <8/10.  Baseline: Goal status: INITIAL  2.  Pt to report compliance with HEP for assist with core motor control and pain management  Baseline:  Goal status: INITIAL  3.  Pt to improve 5xSTS but >1 second Baseline:  Goal status: INITIAL  LONG TERM GOALS: Target  date: 01/28/23  KOOS score improvement >20% to reveal reduced disability from hip pain.  Baseline: visit 2  Goal status: INITIAL  2.  Pt to demonstrate performance of >1571ft with device ad lib, without pain increase any >2 points.  Baseline:  Goal status: INITIAL  3.  Pt to demonstrate 5xSTS <11sec to show BLE power output unrestricted by Rt hip pain.  Baseline:  Goal status: INITIAL  4.  Pt to report ability to AMB entry stairs of home in/out without pain increase in RLE.  Baseline:  Goal status: INITIAL  PLAN:  PT FREQUENCY: 1-2x/week   PT DURATION: 6 weeks  PLANNED INTERVENTIONS: 97110-Therapeutic exercises, 97530- Therapeutic activity, [id number]- Neuromuscular re-education, 97535- Self Care, 16109- Manual therapy, [id number]- Gait training, 97014- Electrical stimulation (unattended), [id number]- Electrical stimulation (manual), [id number]- Ultrasound, [id number]- Ionotophoresis 4mg /ml Dexamethasone, Patient/Family education, Balance training, Stair training, Taping, Dry Needling, Joint mobilization, Vestibular training, DME instructions, Wheelchair mobility training, and Moist heat  PLAN FOR NEXT SESSION:  Hip strengthening: standing exercises with lateral movement. Knee strengthening: SAQ, heel raises, and mini squats. Test isometric variations of exercises for further analgesic effect. Potentially look to patellar mobilizations to decrease pain when indicated.   9:02 AM, 01/03/23 Karna Christmas, SPT  " I agree with the  following treatment note after reviewing documentation. This session was performed under the supervision of a licensed clinician."    7:20 AM, 01/04/23 Amy Small Lynch MPT [facility] [place] [phone]

## 2023-01-06 ENCOUNTER — Ambulatory Visit (HOSPITAL_COMMUNITY): Payer: Commercial Managed Care - PPO

## 2023-01-06 DIAGNOSIS — R262 Difficulty in walking, not elsewhere classified: Secondary | ICD-10-CM

## 2023-01-06 DIAGNOSIS — M7631 Iliotibial band syndrome, right leg: Secondary | ICD-10-CM | POA: Diagnosis not present

## 2023-01-06 NOTE — Therapy (Addendum)
OUTPATIENT PHYSICAL THERAPY TREATMENT   Patient Name: Erica Woods MRN: [id number] DOB:01/28/1966, 57 y.o., female Today's Date: 01/06/2023  END OF SESSION:  PT End of Session - 01/06/23 0846     Visit Number 7    Number of Visits 12    Date for PT Re-Evaluation 01/28/23    Authorization Type [employer]    Authorization Time Period 12/17/22    Progress Note Due on Visit 10    PT Start Time 0846    PT Stop Time 0930    PT Time Calculation (min) 44 min    Activity Tolerance Patient tolerated treatment well;No increased pain    Behavior During Therapy WFL for tasks assessed/performed              Past Medical History:  Diagnosis Date   Allergic rhinitis    Breast cancer (HCC)    Chickenpox    GERD (gastroesophageal reflux disease)    Headache    Hemorrhoids    History of blood transfusion    Hypercholesteremia    Hyperthyroidism    Past Surgical History:  Procedure Laterality Date   ABDOMINAL SURGERY     APPENDECTOMY     BALLOON DILATION N/A 02/04/2017   Procedure: BALLOON DILATION;  Surgeon: Wyline Mood, MD;  Location: [facility] ENDOSCOPY;  Service: Gastroenterology;  Laterality: N/A;   BREAST BIOPSY Left 10/02/2014   Procedure: LEFT BREAST BIOPSY AFTER NEEDLE LOCALIZATION X TWO;  Surgeon: Franky Macho, MD;  Location: [facility] ORS;  Service: General;  Laterality: Left;   BREAST LUMPECTOMY Left 2016   benign   COLONOSCOPY N/A 10/23/2014   Procedure: COLONOSCOPY;  Surgeon: Malissa Hippo, MD;  Location: [facility] ENDO SUITE;  Service: Endoscopy;  Laterality: N/A;  730   ESOPHAGOGASTRODUODENOSCOPY (EGD) WITH PROPOFOL N/A 02/04/2017   Procedure: ESOPHAGOGASTRODUODENOSCOPY (EGD) WITH PROPOFOL;  Surgeon: Wyline Mood, MD;  Location: [facility] ENDOSCOPY;  Service: Gastroenterology;  Laterality: N/A;   ESOPHAGOGASTRODUODENOSCOPY (EGD) WITH PROPOFOL N/A 09/17/2021   Procedure: ESOPHAGOGASTRODUODENOSCOPY (EGD) WITH PROPOFOL;  Surgeon: Wyline Mood, MD;  Location: [facility] ENDOSCOPY;  Service: Endoscopy;   Laterality: N/A;   LAPAROSCOPIC APPENDECTOMY  03/02/2012   Procedure: APPENDECTOMY LAPAROSCOPIC;  Surgeon: Fabio Bering, MD;  Location: [facility] ORS;  Service: General;  Laterality: N/A;   OVARIAN CYST REMOVAL Left    ROOT CANAL  04/07/2022   TUBAL LIGATION     WISDOM TOOTH EXTRACTION Bilateral    Patient Active Problem List   Diagnosis Date Noted   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   Vitamin D deficiency 05/25/2021   Postmenopausal bleeding 11/24/2020   Rash 03/26/2020   Prediabetes 09/20/2018   Stress 11/14/2017   Hot flashes 07/29/2017   Hyperlipidemia 07/29/2017   Hypothyroidism 11/08/2016   Muscle cramps 11/08/2016   Plantar fasciitis, left 07/05/2016   Psoriasis 07/05/2016   Arthralgia of both hands 05/04/2016   History of breast cancer 11/19/2015   Routine general medical examination at a health care facility 05/02/2015   Migraines 11/25/2014   GERD (gastroesophageal reflux disease) 11/25/2014   Atypical lobular hyperplasia [facility]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 8/10 pain in right knee. Left knee also a bit achy. Had a muscle knot Tuesday that was really painful. Her husband was able to help work it out and she was able to do her exercises with much less pain. Noted a little more soreness Monday afternoon following PT but not too much. Has had success with using SARA lift at work with less knee pain.     PERTINENT HISTORY: Erica Woods is a 57yoF who is referred to OPPT by Rheumatology for Rt sided ITBS. Pt reports pain in both Rt lateral knee, as well as Rt posterolateral hip area, although she reports the more proximal of the 2 being 3 years chronic and the latter being ~2-3 months chronic. Pt reports bilat low back pain that is more chronic, she does not feel a part of this current issue. Pt reports Rt hip diagnosis being labeled as bursitis, chart indicates trochanteric. At baseline pt AMB 5-6 day weekly x30 minutes for physical activity, but has not does this since the Summer. 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 Pt takes 600mg  ibuprofen wehn pain is bad, this helps a lot. Pt has never taken PT services for her Right hip issue.   Pain Behavior: Current pain rated 8/10, as bad as 10/10, and as good as 6/10, but pt does not have any complete pain remission. Pt most aggravated by sitting postures and ascending stairs > descending stairs.    PMH: Arthropathy of lumbar facet joint, osteoarthritis of both feet, Osteopenia via DEXA vitamin D deficiency, Lobular carcinoma in situ (LCIS) of left breast,  GERD, HLD, hypoTSH, migraines.  PAIN:  Are you having pain? YES 8/10 Rt hip, Rt knee   PRECAUTIONS: None; osteopenia  WEIGHT BEARING RESTRICTIONS: No  FALLS:  Has patient fallen in last 6 months? N  LIVING ENVIRONMENT: Lives with: Husband, older son 55, works FT  Lives in: Art therapist home 2000sqft Stairs: 8 stairs, 2 rails   OCCUPATION: FT CNA, memory, works nights   PLOF: used to walk 3x/week   PATIENT GOALS:   NEXT MD VISIT: unknown  OBJECTIVE:  Note: Objective measures were completed at Evaluation unless otherwise noted.  DIAGNOSTIC FINDINGS:  Primary osteoarthritis of both feet - Under care of Dr. Logan Bores, arthropathy of lumbar facet joint: No midline spinal tenderness in the lumbar region currently.  No symptoms of radiculopathy.  PATIENT SURVEYS:  *defer HOOS  to visit 2  COGNITION: Overall cognitive status: WNL     SENSATION: Reports an area of numbness near the distal most 4 inches of the ITB area exclusively proximal the joint line; Pt reports during remote pregnancy having Rt sided numbness higher up in the Rt pocket distribution of the lateral femoral cutaneous nerve.   EDEMA:  Minimal to zero edema about the patella, patella ligament, lateral right knee.    PALPATION:  -Taut bands of the distal most lateral quads fiber anterior to the ITB, 1-2 inches away from patella; very tender initially, but improved with sustained Ischaemic release, multiple twitch areas noted, release following over 4 minutes.   -Rt lateral gluteals: moderately tender   -Rt greater trochanter: mildly tender, not consistent with CC pain   -Rt posterolateral gluteals: mega tender  -Bialt lumbar paras are equally tender   LOWER QUARTER EXAM:   Right eval Left eval  Hip flexion 120 deg*  Hip extension Poor tolerance to zero degrees supine    Hip extension (supine @ 30 deg flexion)  3+/5*  4+/5  Hip abduction 4+/5 4+/5  Hip abduction 45 deg   Hip internal rotation 30*   Hip external rotation 45     LOWER EXTREMITY SPECIAL TESTS:  -External DeRotation Test (Gluteal tendinopathy): Rt negative   FUNCTIONAL TESTS:  5xSTS: 12.28 hands free (mild pain limitations)  GAIT: -fairly antalgic and compensated -abducted RLE  -avoids most normal knee flexion in swing phase -high velocity medium amplitude trendelenburg of Rt hip during loading phase   TODAY'S TREATMENT:                                                                                                                              DATE: 01/06/23  TE: 01/06/23 Supine:  Moist heat to right knee 5'  SLR 2x10   ITB stretch 3 x 30 seconds  Sidelying clamshells 3 x 10 with RTB each side   Seated LAQ 3 x 8 each LE 4# AW and 3 sec hold Wall sit at 135 degrees of knee flexion 3 x 20" - felt better each  round  TKE with RTB 2x10 NuStep 6' level 3 seat 7  TE: 12/31/22 NuStep 6' level 3 seat 7 Supine ITB stretch 3 x 30 seconds  Sidelying clamshells 3 x 10 with RTB each side   Seated LAQ 3 x 8 each LE 4# AW and 3 sec hold Wall sit at 135 degrees of knee flexion 3 x 20" - felt better each round  Side steps 10 steps each direction 2x - pain went up Calf stretch on slant board 3 x 30"   TE: 12/31/22 Supine ITB stretch 3 x 30 seconds  Sidelying clamshells 3 x 10 with RTB each side   Seated LAQ 2 x 15 each LE 3# AW  STS x 10 - painful  Mini squat iso 15 sec x 2 - pain free Mini lunge iso 15 sec x 2 - pain free  Heel raises x15 double leg, x10 single leg   TE: 12/29/22 Nustep level 2 x 8 minutes (seat 6) - B UE and LE   Supine ITB stretch 3 x 30 seconds  Sidelying clamshells 2 x 10 with RTB each side   Seated LAQ 3 x 10 each LE 3# AW  Sit to stand 2 x 15  Patient education on activity modifications for moving patients in a [place] lift.    TE: 12/22/22 Nustep level 2 x 6 minutes (seat 7) - B UE and LE   Supine ITB stretch 3 x 30 seconds  Sidelying clamshells 2 x 10 with RTB each side   Sit to stand 2 x 10 with 3kg ball Standing hip abduction 2 x 10 with 2# AW each LE with UE support Standing hip extension 2 x 10 with 2# AW each LE with UE support Seated LAQ 2 x 10 each  LE 2# AW   PATIENT EDUCATION:  Education details: explained exam findings, association of clinical bursitis with muscle//tendon injury in the literature.  Person educated: Patient, husband  Education method: talk therapy Education comprehension: 7/10   HOME EXERCISE PROGRAM: Access Code: [email or web address] URL: [email or web address] Date: 12/21/2022 Prepared by: Maylon Peppers  Exercises - Supine ITB Stretch  - 2-3 x daily - 5-7 x weekly - 3 sets - 10 reps - Clamshell  - 2-3 x daily - 5-7 x weekly - 3 sets - 10 reps - ITB Stretch at Wall  - 2-3 x daily - 5-7 x weekly - 3 sets - 10 reps - Standing Hip  Abduction with Counter Support  - 2-3 x daily - 5-7 x weekly - 3 sets - 10 reps - Standing Hip Extension with Counter Support  - 2-3 x daily - 5-7 x weekly - 3 sets - 10 reps  ASSESSMENT:  CLINICAL IMPRESSION:     Patient's gait noticeably antalgic with decreased stance time on right leg when walking to treatment table from clinic lobby. Moist heat allowed for pain relief and improved tolerance to movement. Despite feeling achy and stiff to start the session, patient is still progressing well. Introducing a TKE did not increase pain and patient felt appropriate quad fatigue. Patient was able to tolerate today's adjustments well. At the end of session, patient reported feeling much better than at the start of session. Gait notably improved and less antalgic. She continues to benefit from isometric exercises and LE exercise that don't excessively increase joint pressure. Patient will continue to benefit from skilled therapy to address remaining deficits in order to improve quality of life and decrease R LE pain.    OBJECTIVE IMPAIRMENTS: Abnormal gait, cardiopulmonary status limiting activity, decreased activity tolerance, decreased balance, decreased knowledge of use of DME, decreased mobility, difficulty walking, decreased ROM, decreased strength, impaired perceived functional ability, increased muscle spasms, and impaired sensation.   ACTIVITY LIMITATIONS: carrying, lifting, bending, sitting, standing, squatting, sleeping, stairs, transfers, and toileting  PARTICIPATION LIMITATIONS: meal prep, cleaning, laundry, interpersonal relationship, driving, community activity, and occupation  PERSONAL FACTORS: Age, Behavior pattern, Fitness, Past/current experiences, Profession, Time since onset of injury/illness/exacerbation, and Transportation are also affecting patient's functional outcome.   REHAB POTENTIAL: Good  CLINICAL DECISION MAKING: Stable/uncomplicated  EVALUATION COMPLEXITY:  Moderate   GOALS: Goals reviewed with patient? No  SHORT TERM GOALS: Target date: 01/14/23 Pt to report reduced peak pain in Rt hip during work and housework, <8/10.  Baseline: Goal status: INITIAL  2.  Pt to report compliance with HEP for assist with core motor control and pain management  Baseline:  Goal status: INITIAL  3.  Pt to improve 5xSTS but >1 second Baseline:  Goal status: INITIAL  LONG TERM GOALS: Target date: 01/28/23  KOOS score improvement >20% to reveal reduced disability from hip pain.  Baseline: visit 2  Goal status: INITIAL  2.  Pt to demonstrate performance of >1562ft with device ad lib, without pain increase any >2 points.  Baseline:  Goal status: INITIAL  3.  Pt to demonstrate 5xSTS <11sec to show BLE power output unrestricted by Rt hip pain.  Baseline:  Goal status: INITIAL  4.  Pt to report ability to AMB entry stairs of home in/out without pain increase in RLE.  Baseline:  Goal status: INITIAL  PLAN:  PT FREQUENCY: 1-2x/week   PT DURATION: 6 weeks  PLANNED INTERVENTIONS: 97110-Therapeutic exercises, 97530- Therapeutic activity, [id number]- Neuromuscular re-education, 97535- Self Care, [id number]-  Manual therapy, [id number]- Gait training, [id number]- Electrical stimulation (unattended), [id number]- Electrical stimulation (manual), [id number]- Ultrasound, [id number]- Ionotophoresis 4mg /ml Dexamethasone, Patient/Family education, Balance training, Stair training, Taping, Dry Needling, Joint mobilization, Vestibular training, DME instructions, Wheelchair mobility training, and Moist heat  PLAN FOR NEXT SESSION:  Hip strengthening: standing exercises with lateral movement. Knee strengthening: SAQ, heel raises, and mini squats. Test isometric variations of exercises for further analgesic effect. Potentially look to patellar mobilizations to decrease pain when indicated.   8:46 AM, 01/06/23 Karna Christmas, SPT  " I agree with the following treatment note after reviewing  documentation. This session was performed under the supervision of a licensed clinician."    8:46 AM, 01/06/23 Amy Small Lynch MPT [facility] [place] [phone]

## 2023-01-07 NOTE — Therapy (Signed)
OUTPATIENT PHYSICAL THERAPY TREATMENT   Patient Name: Erica Woods MRN: [id number] DOB:10/26/1965, 57 y.o., female Today's Date: 01/10/2023  END OF SESSION:  PT End of Session - 01/10/23 0846     Visit Number 8    Number of Visits 12    Date for PT Re-Evaluation 01/28/23    Authorization Type [employer]    Authorization Time Period 12/17/22    Progress Note Due on Visit 10    PT Start Time 0845    PT Stop Time 0927    PT Time Calculation (min) 42 min    Activity Tolerance Patient tolerated treatment well;No increased pain    Behavior During Therapy WFL for tasks assessed/performed               Past Medical History:  Diagnosis Date   Allergic rhinitis    Breast cancer (HCC)    Chickenpox    GERD (gastroesophageal reflux disease)    Headache    Hemorrhoids    History of blood transfusion    Hypercholesteremia    Hyperthyroidism    Past Surgical History:  Procedure Laterality Date   ABDOMINAL SURGERY     APPENDECTOMY     BALLOON DILATION N/A 02/04/2017   Procedure: BALLOON DILATION;  Surgeon: Wyline Mood, MD;  Location: [facility] ENDOSCOPY;  Service: Gastroenterology;  Laterality: N/A;   BREAST BIOPSY Left 10/02/2014   Procedure: LEFT BREAST BIOPSY AFTER NEEDLE LOCALIZATION X TWO;  Surgeon: Franky Macho, MD;  Location: [facility] ORS;  Service: General;  Laterality: Left;   BREAST LUMPECTOMY Left 2016   benign   COLONOSCOPY N/A 10/23/2014   Procedure: COLONOSCOPY;  Surgeon: Malissa Hippo, MD;  Location: [facility] ENDO SUITE;  Service: Endoscopy;  Laterality: N/A;  730   ESOPHAGOGASTRODUODENOSCOPY (EGD) WITH PROPOFOL N/A 02/04/2017   Procedure: ESOPHAGOGASTRODUODENOSCOPY (EGD) WITH PROPOFOL;  Surgeon: Wyline Mood, MD;  Location: [facility] ENDOSCOPY;  Service: Gastroenterology;  Laterality: N/A;   ESOPHAGOGASTRODUODENOSCOPY (EGD) WITH PROPOFOL N/A 09/17/2021   Procedure: ESOPHAGOGASTRODUODENOSCOPY (EGD) WITH PROPOFOL;  Surgeon: Wyline Mood, MD;  Location: [facility] ENDOSCOPY;  Service: Endoscopy;   Laterality: N/A;   LAPAROSCOPIC APPENDECTOMY  03/02/2012   Procedure: APPENDECTOMY LAPAROSCOPIC;  Surgeon: Fabio Bering, MD;  Location: [facility] ORS;  Service: General;  Laterality: N/A;   OVARIAN CYST REMOVAL Left    ROOT CANAL  04/07/2022   TUBAL LIGATION     WISDOM TOOTH EXTRACTION Bilateral    Patient Active Problem List   Diagnosis Date Noted   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   Vitamin D deficiency 05/25/2021   Postmenopausal bleeding 11/24/2020   Rash 03/26/2020   Prediabetes 09/20/2018   Stress 11/14/2017   Hot flashes 07/29/2017   Hyperlipidemia 07/29/2017   Hypothyroidism 11/08/2016   Muscle cramps 11/08/2016   Plantar fasciitis, left 07/05/2016   Psoriasis 07/05/2016   Arthralgia of both hands 05/04/2016   History of breast cancer 11/19/2015   Routine general medical examination at a health care facility 05/02/2015   Migraines 11/25/2014   GERD (gastroesophageal reflux disease) 11/25/2014   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 Her knee is still bothersome and rates 5/10   PERTINENT HISTORY: Erica Woods is a 57yoF who is referred to OPPT by Rheumatology for Rt sided ITBS. Pt reports pain in both Rt lateral knee, as well as Rt posterolateral hip area, although she reports the more proximal of the 2 being 3 years chronic and the latter being ~2-3 months chronic. Pt reports bilat low back  pain that is more chronic, she does not feel a part of this current issue. Pt reports Rt hip diagnosis being labeled as bursitis, chart indicates trochanteric. At baseline pt AMB 5-6 day weekly x30 minutes for physical activity, but has not does this since the Summer. 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 Pt takes 600mg  ibuprofen wehn pain is bad, this helps a lot. Pt has never taken PT services for her Right hip issue.   Pain Behavior: Current pain rated 8/10, as bad as 10/10, and as good as 6/10, but pt does not have any complete pain remission. Pt most aggravated by sitting postures and ascending stairs > descending stairs.    PMH: Arthropathy of lumbar facet joint, osteoarthritis of both feet, Osteopenia via DEXA vitamin D deficiency, Lobular carcinoma in situ (LCIS) of left breast,  GERD, HLD, hypoTSH, migraines.  PAIN:  Are you having pain? YES 8/10 Rt hip, Rt knee   PRECAUTIONS: None; osteopenia  WEIGHT BEARING RESTRICTIONS: No  FALLS:  Has patient fallen in last 6 months? N  LIVING ENVIRONMENT: Lives with: Husband, older son 28, works FT  Lives in: Art therapist home 2000sqft Stairs: 8 stairs, 2 rails   OCCUPATION: FT CNA, memory, works nights   PLOF: used to walk 3x/week   PATIENT GOALS:   NEXT MD VISIT: unknown  OBJECTIVE:  Note: Objective measures were completed at Evaluation unless otherwise noted.  DIAGNOSTIC FINDINGS:  Primary osteoarthritis of both feet - Under care of Dr. Logan Bores, arthropathy of lumbar facet joint: No midline spinal tenderness in the lumbar region currently.  No symptoms of radiculopathy.  PATIENT SURVEYS:  *defer HOOS to visit 2  COGNITION: Overall cognitive status: WNL     SENSATION: Reports an area of numbness near the distal most 4 inches of the ITB area exclusively proximal the joint line; Pt reports during remote pregnancy having Rt sided numbness higher up in the Rt pocket distribution of the lateral femoral cutaneous nerve.    EDEMA:  Minimal to zero edema about the patella, patella ligament, lateral right knee.    PALPATION:  -Taut bands of the distal most lateral quads fiber anterior to the ITB, 1-2 inches away from patella; very tender initially, but improved with sustained Ischaemic release, multiple twitch areas noted, release following over 4 minutes.   -Rt lateral gluteals: moderately tender   -Rt greater trochanter: mildly tender, not consistent with CC pain   -Rt posterolateral gluteals: mega tender  -Bialt lumbar paras are equally tender   LOWER QUARTER EXAM:   Right eval Left eval  Hip flexion 120 deg*   Hip extension Poor tolerance to zero degrees supine    Hip extension (supine @ 30 deg flexion)  3+/5*  4+/5  Hip abduction 4+/5 4+/5  Hip abduction 45 deg   Hip internal rotation 30*   Hip external rotation 45     LOWER EXTREMITY SPECIAL TESTS:  -External DeRotation Test (Gluteal tendinopathy):  Rt negative   FUNCTIONAL TESTS:  5xSTS: 12.28 hands free (mild pain limitations)  GAIT: -fairly antalgic and compensated -abducted RLE  -avoids most normal knee flexion in swing phase -high velocity medium amplitude trendelenburg of Rt hip during loading phase   TODAY'S TREATMENT:                                                                                                                              DATE:   01/10/23:   Nustep level 3 x 6 minutes  TKE with #2 plate 2 x 10  Leg press #2 plate 2 x 10  Double heel raises on leg press with #1 plate 2 x 10  Hamstring curls #4 plate 2 Z61  Wall sit at 135 degrees of knee flexion 3 x 20 seconds   TE: 01/06/23 Supine:  Moist heat to right knee 5'  SLR 2x10   ITB stretch 3 x 30 seconds  Sidelying clamshells 3 x 10 with RTB each side   Seated LAQ 3 x 8 each LE 4# AW and 3 sec hold Wall sit at 135 degrees of knee flexion 3 x 20" - felt better each round  TKE with RTB 2x10 NuStep 6' level 3 seat 7  TE: 12/31/22 NuStep 6' level 3 seat  7 Supine ITB stretch 3 x 30 seconds  Sidelying clamshells 3 x 10 with RTB each side   Seated LAQ 3 x 8 each LE 4# AW and 3 sec hold Wall sit at 135 degrees of knee flexion 3 x 20" - felt better each round  Side steps 10 steps each direction 2x - pain went up Calf stretch on slant board 3 x 30"   TE: 12/31/22 Supine ITB stretch 3 x 30 seconds  Sidelying clamshells 3 x 10 with RTB each side   Seated LAQ 2 x 15 each LE 3# AW  STS x 10 - painful  Mini squat iso 15 sec x 2 - pain free Mini lunge iso 15 sec x 2 - pain free  Heel raises x15 double leg, x10 single leg   TE: 12/29/22 Nustep level 2 x 8 minutes (seat 6) - B UE and LE   Supine ITB stretch 3 x 30 seconds  Sidelying clamshells 2 x 10 with RTB each side   Seated LAQ 3 x 10 each LE 3# AW  Sit to stand 2 x 15  Patient education on activity modifications for moving patients in a [place] lift.    TE: 12/22/22 Nustep level 2 x 6 minutes (seat 7) - B UE and LE   Supine ITB stretch 3 x 30 seconds  Sidelying clamshells 2 x 10 with RTB each side   Sit to stand 2 x 10 with 3kg ball Standing hip abduction 2 x 10 with 2# AW each LE with UE support Standing hip extension 2 x 10 with 2# AW each LE with UE support Seated LAQ 2 x  10 each LE 2# AW   PATIENT EDUCATION:  Education details: explained exam findings, association of clinical bursitis with muscle//tendon injury in the literature.  Person educated: Patient, husband  Education method: talk therapy Education comprehension: 7/10   HOME EXERCISE PROGRAM: Access Code: [email or web address] URL: [email or web address] Date: 12/21/2022 Prepared by: Maylon Peppers  Exercises - Supine ITB Stretch  - 2-3 x daily - 5-7 x weekly - 3 sets - 10 reps - Clamshell  - 2-3 x daily - 5-7 x weekly - 3 sets - 10 reps - ITB Stretch at Wall  - 2-3 x daily - 5-7 x weekly - 3 sets - 10 reps - Standing Hip Abduction with Counter Support  - 2-3 x daily - 5-7 x weekly - 3 sets - 10 reps - Standing Hip  Extension with Counter Support  - 2-3 x daily - 5-7 x weekly - 3 sets - 10 reps  ASSESSMENT:  CLINICAL IMPRESSION:      Patient arrives to treatment session motivated to participate and reports of some pain in R knee although ITB feeling good. Has recently joined a gym and will begin attending starting this week. Session focused on introduction gym equipment with increase in weights.Discussed plan to discharge in 2 weeks as patient has made progress. Patient will continue to benefit from skilled therapy to address remaining deficits in order to improve quality of life and decrease R LE pain.    OBJECTIVE IMPAIRMENTS: Abnormal gait, cardiopulmonary status limiting activity, decreased activity tolerance, decreased balance, decreased knowledge of use of DME, decreased mobility, difficulty walking, decreased ROM, decreased strength, impaired perceived functional ability, increased muscle spasms, and impaired sensation.   ACTIVITY LIMITATIONS: carrying, lifting, bending, sitting, standing, squatting, sleeping, stairs, transfers, and toileting  PARTICIPATION LIMITATIONS: meal prep, cleaning, laundry, interpersonal relationship, driving, community activity, and occupation  PERSONAL FACTORS: Age, Behavior pattern, Fitness, Past/current experiences, Profession, Time since onset of injury/illness/exacerbation, and Transportation are also affecting patient's functional outcome.   REHAB POTENTIAL: Good  CLINICAL DECISION MAKING: Stable/uncomplicated  EVALUATION COMPLEXITY: Moderate   GOALS: Goals reviewed with patient? No  SHORT TERM GOALS: Target date: 01/14/23 Pt to report reduced peak pain in Rt hip during work and housework, <8/10.  Baseline: Goal status: INITIAL  2.  Pt to report compliance with HEP for assist with core motor control and pain management  Baseline:  Goal status: INITIAL  3.  Pt to improve 5xSTS but >1 second Baseline:  Goal status: INITIAL  LONG TERM GOALS: Target  date: 01/28/23  KOOS score improvement >20% to reveal reduced disability from hip pain.  Baseline: visit 2  Goal status: INITIAL  2.  Pt to demonstrate performance of >1575ft with device ad lib, without pain increase any >2 points.  Baseline:  Goal status: INITIAL  3.  Pt to demonstrate 5xSTS <11sec to show BLE power output unrestricted by Rt hip pain.  Baseline:  Goal status: INITIAL  4.  Pt to report ability to AMB entry stairs of home in/out without pain increase in RLE.  Baseline:  Goal status: INITIAL  PLAN:  PT FREQUENCY: 1-2x/week   PT DURATION: 6 weeks  PLANNED INTERVENTIONS: 97110-Therapeutic exercises, 97530- Therapeutic activity, [id number]- Neuromuscular re-education, 97535- Self Care, [id number]- Manual therapy, [id number]- Gait training, 97014- Electrical stimulation (unattended), [id number]- Electrical stimulation (manual), [id number]- Ultrasound, 86578- Ionotophoresis 4mg /ml Dexamethasone, Patient/Family education, Balance training, Stair training, Taping, Dry Needling, Joint mobilization, Vestibular training, DME instructions, Wheelchair mobility training, and Moist heat  PLAN FOR NEXT SESSION:  Hip  strengthening: standing exercises with lateral movement. Knee strengthening: SAQ, heel raises, and mini squats. Test isometric variations of exercises for further analgesic effect. Potentially look to patellar mobilizations to decrease pain when indicated.  Maylon Peppers, PT, DPT  8:46 AM, 01/10/23

## 2023-01-10 ENCOUNTER — Encounter (HOSPITAL_COMMUNITY): Payer: Self-pay

## 2023-01-10 ENCOUNTER — Ambulatory Visit (HOSPITAL_COMMUNITY): Payer: Commercial Managed Care - PPO

## 2023-01-10 DIAGNOSIS — M7631 Iliotibial band syndrome, right leg: Secondary | ICD-10-CM

## 2023-01-10 DIAGNOSIS — R262 Difficulty in walking, not elsewhere classified: Secondary | ICD-10-CM

## 2023-01-11 ENCOUNTER — Encounter (HOSPITAL_COMMUNITY): Payer: Commercial Managed Care - PPO

## 2023-01-17 ENCOUNTER — Ambulatory Visit (HOSPITAL_COMMUNITY): Payer: Commercial Managed Care - PPO | Attending: Physician Assistant

## 2023-01-17 DIAGNOSIS — M7631 Iliotibial band syndrome, right leg: Secondary | ICD-10-CM | POA: Insufficient documentation

## 2023-01-17 DIAGNOSIS — R262 Difficulty in walking, not elsewhere classified: Secondary | ICD-10-CM | POA: Diagnosis present

## 2023-01-17 NOTE — Therapy (Addendum)
OUTPATIENT PHYSICAL THERAPY TREATMENT   Patient Name: Erica Woods MRN: [id number] DOB:Dec 13, 1965, 57 y.o., female Today's Date: 01/17/2023  END OF SESSION:  PT End of Session - 01/17/23 0842     Visit Number 9    Number of Visits 12    Date for PT Re-Evaluation 01/28/23    Authorization Type [employer]    Authorization Time Period 12/17/22    Progress Note Due on Visit 10    PT Start Time 0845    PT Stop Time 0930    PT Time Calculation (min) 45 min    Activity Tolerance Patient tolerated treatment well;No increased pain    Behavior During Therapy WFL for tasks assessed/performed              Past Medical History:  Diagnosis Date   Allergic rhinitis    Breast cancer (HCC)    Chickenpox    GERD (gastroesophageal reflux disease)    Headache    Hemorrhoids    History of blood transfusion    Hypercholesteremia    Hyperthyroidism    Past Surgical History:  Procedure Laterality Date   ABDOMINAL SURGERY     APPENDECTOMY     BALLOON DILATION N/A 02/04/2017   Procedure: BALLOON DILATION;  Surgeon: Wyline Mood, MD;  Location: [facility] END[facility]COPY;  Service: Gastroenterology;  Laterality: N/A;   BREAST BIOPSY Left 10/02/2014   Procedure: LEFT BREAST BIOPSY AFTER NEEDLE LOCALIZATION X TWO;  Surgeon: Franky Macho, MD;  Location: [facility] ORS;  Service: General;  Laterality: Left;   BREAST LUMPECTOMY Left 2016   benign   COLONOSCOPY N/A 10/23/2014   Procedure: COLONOSCOPY;  Surgeon: Malissa Hippo, MD;  Location: [facility] ENDO SUITE;  Service: Endoscopy;  Laterality: N/A;  730   ESOPHAGOGASTRODUODENOSCOPY (EGD) WITH PROPOFOL N/A 02/04/2017   Procedure: ESOPHAGOGASTRODUODENOSCOPY (EGD) WITH PROPOFOL;  Surgeon: Wyline Mood, MD;  Location: [facility] ENDOSCOPY;  Service: Gastroenterology;  Laterality: N/A;   ESOPHAGOGASTRODUODENOSCOPY (EGD) WITH PROPOFOL N/A 09/17/2021   Procedure: ESOPHAGOGASTRODUODENOSCOPY (EGD) WITH PROPOFOL;  Surgeon: Wyline Mood, MD;  Location: [facility] ENDOSCOPY;  Service: Endoscopy;   Laterality: N/A;   LAPAROSCOPIC APPENDECTOMY  03/02/2012   Procedure: APPENDECTOMY LAPAROSCOPIC;  Surgeon: Fabio Bering, MD;  Location: [facility] ORS;  Service: General;  Laterality: N/A;   OVARIAN CYST REMOVAL Left    ROOT CANAL  04/07/2022   TUBAL LIGATION     WISDOM TOOTH EXTRACTION Bilateral    Patient Active Problem List   Diagnosis Date Noted   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   Vitamin D deficiency 05/25/2021   Postmenopausal bleeding 11/24/2020   Rash 03/26/2020   Prediabetes 09/20/2018   Stress 11/14/2017   Hot flashes 07/29/2017   Hyperlipidemia 07/29/2017   Hypothyroidism 11/08/2016   Muscle cramps 11/08/2016   Plantar fasciitis, left 07/05/2016   Psoriasis 07/05/2016   Arthralgia of both hands 05/04/2016   History of breast cancer 11/19/2015   Routine general medical examination at a health care facility 05/02/2015   Migraines 11/25/2014   GERD (gastroesophageal reflux disease) 11/25/2014   Atypical lobular hyperplasia [facility]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 Has been trying to go to the gym every other day alternating upper body and lower body. Her hip bothered her on Friday, warming up on the Nustep at the gym relieved her pain. Highest pain in hip and/or knee since last session 3/10.   PERTINENT HISTORY: Erica Woods is a 57yoF who is referred to OPPT by Rheumatology for Rt sided ITBS. Pt reports pain in both Rt lateral knee, as  well as Rt posterolateral hip area, although she reports the more proximal of the 2 being 3 years chronic and the latter being ~2-3 months chronic. Pt reports bilat low back pain that is more chronic, she does not feel a part of this current issue. Pt reports Rt hip diagnosis being labeled as bursitis, chart indicates trochanteric. At baseline pt AMB 5-6 day weekly x30 minutes for physical activity, but has not does this since the Summer. 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 Pt takes 600mg  ibuprofen wehn pain is bad, this helps a lot. Pt has never taken PT services for her Right hip issue.   Pain Behavior: Current pain rated 8/10, as bad as 10/10, and as good as 6/10, but pt does not have any complete pain remission. Pt most aggravated by sitting postures and ascending stairs > descending stairs.    PMH: Arthropathy of lumbar facet joint, osteoarthritis of both feet, Osteopenia via DEXA vitamin D deficiency, Lobular carcinoma in situ (LCIS) of left breast,  GERD, HLD, hypoTSH, migraines.  PAIN:  Are you having pain? YES 8/10 Rt hip, Rt knee   PRECAUTIONS: None; osteopenia  WEIGHT BEARING RESTRICTIONS: No  FALLS:  Has patient fallen in last 6 months? N  LIVING ENVIRONMENT: Lives with: Husband, older son 45, works FT  Lives in: Art therapist home 2000sqft Stairs: 8 stairs, 2 rails   OCCUPATION: FT CNA, memory, works nights   PLOF: used to walk 3x/week   PATIENT GOALS:   NEXT MD VISIT: unknown  OBJECTIVE:  Note: Objective measures were completed at Evaluation unless otherwise noted.  DIAGNOSTIC FINDINGS:  Primary osteoarthritis of both feet - Under care of Dr. Logan Bores, arthropathy of lumbar facet joint: No midline spinal tenderness in the lumbar region currently.  No symptoms of radiculopathy.  PATIENT SURVEYS:  *defer HOOS to visit 2  COGNITION: Overall cognitive status: WNL     SENSATION: Reports an area of numbness near the distal most 4 inches of the ITB area  exclusively proximal the joint line; Pt reports during remote pregnancy having Rt sided numbness higher up in the Rt pocket distribution of the lateral femoral cutaneous nerve.   EDEMA:  Minimal to zero edema about the patella, patella ligament, lateral right knee.    PALPATION:  -Taut bands of the distal most lateral quads fiber anterior to the ITB, 1-2 inches away from patella; very tender initially, but improved with sustained Ischaemic release, multiple twitch areas noted, release following over 4 minutes.   -Rt lateral gluteals: moderately tender   -Rt greater trochanter: mildly tender, not consistent with CC pain   -Rt posterolateral gluteals: mega tender  -Bialt lumbar paras are equally tender   LOWER QUARTER EXAM:   Right eval Left eval  Hip flexion 120 deg*   Hip extension Poor tolerance to zero degrees supine    Hip extension (supine @ 30 deg flexion)  3+/5*  4+/5  Hip abduction 4+/5 4+/5  Hip abduction 45 deg   Hip internal rotation 30*   Hip external rotation 45     LOWER EXTREMITY SPECIAL TESTS:  -External DeRotation Test (Gluteal tendinopathy): Rt negative   FUNCTIONAL TESTS:  5xSTS: 12.28 hands free (mild pain limitations)  GAIT: -fairly antalgic and compensated -abducted RLE  -avoids most normal knee flexion in swing phase -high velocity medium amplitude trendelenburg of Rt hip during loading phase   TODAY'S TREATMENT:                                                                                                                              DATE:   01/17/23: Nustep level 3 x 6 minutes  TKE with #4 plate 3 x 10  Leg press #4 plate 2 x 10 Double heel raises on leg press with #1 plate 2 x 10  Seated HS curls plate #4 2 x8 Wall sit at 135 degrees of knee flexion 3 x 20 seconds  Standing Hip ABD with 2 HHA 2 x10 bilaterally 1:1 work:rest   01/10/23:   Nustep level 3 x 6 minutes  TKE with #2 plate 2 x 10  Leg press #2 plate 2 x 10  Double heel raises  on leg press with #1 plate 2 x 10  Hamstring curls #4 plate 2 Q03  Wall sit at 135 degrees of knee flexion 3 x 20 seconds   TE: 01/06/23 Supine:  Moist heat to right knee 5'  SLR 2x10   ITB stretch 3 x 30 seconds  Sidelying clamshells 3 x 10 with RTB each side   Seated LAQ 3 x 8 each LE 4# AW and 3 sec hold Wall sit at 135 degrees of knee flexion 3 x 20" - felt better each round  TKE with RTB 2x10 NuStep 6' level 3 seat 7  TE: 12/31/22 NuStep 6' level 3 seat 7 Supine ITB stretch 3 x 30 seconds  Sidelying clamshells 3 x 10 with RTB each side   Seated LAQ 3 x 8 each LE 4# AW and 3 sec hold Wall sit at 135 degrees of knee flexion 3 x 20" - felt better each round  Side steps 10 steps each direction 2x - pain went up Calf stretch on slant board 3 x 30"   TE: 12/31/22 Supine ITB stretch 3 x 30 seconds  Sidelying clamshells 3 x 10 with RTB each side   Seated LAQ 2 x 15 each LE 3# AW  STS x 10 - painful  Mini squat iso 15 sec x 2 - pain free Mini lunge iso 15 sec x 2 - pain free  Heel raises x15 double leg, x10 single leg   TE: 12/29/22 Nustep level 2 x 8 minutes (seat 6) - B UE and LE   Supine ITB stretch 3 x 30 seconds  Sidelying clamshells 2 x 10 with RTB each side   Seated LAQ 3 x 10 each LE 3# AW  Sit to stand 2 x 15  Patient education on activity modifications for moving patients in a [place] lift.    TE: 12/22/22 Nustep level 2 x 6 minutes (seat 7) - B UE and LE   Supine ITB stretch 3 x 30 seconds  Sidelying clamshells 2 x 10 with RTB each side   Sit to stand 2 x 10 with 3kg ball Standing hip abduction 2 x 10 with 2# AW each LE with UE support Standing hip extension 2 x 10 with 2# AW each LE with UE support Seated LAQ 2 x 10 each LE 2# AW   PATIENT EDUCATION:  Education details: explained exam findings, association of clinical bursitis with muscle//tendon injury in the literature.  Person educated: Patient, husband  Education method: talk therapy Education  comprehension: 7/10   HOME EXERCISE PROGRAM: Access Code: [email or web address] URL: [email or web address] Date: 12/21/2022 Prepared by: Maylon Peppers  Exercises - Supine ITB Stretch  - 2-3 x daily - 5-7 x weekly - 3 sets - 10 reps - Clamshell  - 2-3 x daily - 5-7 x weekly - 3 sets - 10 reps - ITB Stretch at Wall  - 2-3 x daily - 5-7 x weekly - 3 sets - 10 reps - Standing Hip Abduction with Counter Support  - 2-3 x daily - 5-7 x weekly - 3 sets - 10 reps - Standing Hip Extension with Counter Support  - 2-3 x daily - 5-7 x weekly - 3 sets - 10 reps  ASSESSMENT:  CLINICAL IMPRESSION:      Patient arrives to treatment session motivated to participate and reports improvements of R knee pain and ITB feeling good recently. Gym session have been well tolerated. Today PT reviewed HEP and gym exercises. Helped develop plan for merging HEP and gym sessions. Discussed plan to discharge in the next week as patient has made progress. Patient will continue to benefit from skilled therapy to address remaining deficits in order to improve quality of life and decrease R LE pain.    OBJECTIVE IMPAIRMENTS: Abnormal gait, cardiopulmonary status limiting activity, decreased activity tolerance, decreased balance, decreased knowledge of use of DME, decreased mobility, difficulty walking, decreased ROM, decreased strength, impaired perceived functional ability, increased muscle spasms, and impaired sensation.   ACTIVITY LIMITATIONS: carrying, lifting, bending, sitting, standing, squatting, sleeping, stairs, transfers, and toileting  PARTICIPATION LIMITATIONS: meal prep, cleaning, laundry, interpersonal relationship, driving, community activity, and occupation  PERSONAL FACTORS: Age, Behavior pattern, Fitness, Past/current experiences, Profession, Time since onset of injury/illness/exacerbation, and Transportation are also affecting patient's functional outcome.   REHAB POTENTIAL: Good  CLINICAL DECISION  MAKING: Stable/uncomplicated  EVALUATION COMPLEXITY: Moderate   GOALS: Goals reviewed with patient? No  SHORT TERM GOALS: Target date: 01/14/23 Pt to report reduced peak pain in Rt hip during work and housework, <8/10.  Baseline: Goal status: INITIAL  2.  Pt to report compliance with HEP for assist with core motor control and pain management  Baseline:  Goal status: INITIAL  3.  Pt to improve 5xSTS but >1 second Baseline:  Goal status: INITIAL  LONG TERM GOALS: Target date: 01/28/23  KOOS score improvement >20% to reveal reduced disability from hip pain.  Baseline: visit 2  Goal status: INITIAL  2.  Pt to demonstrate performance of >1571ft with device ad lib, without pain increase any >2 points.  Baseline:  Goal status: INITIAL  3.  Pt to demonstrate 5xSTS <11sec to show BLE power output unrestricted by Rt hip pain.  Baseline:  Goal  status: INITIAL  4.  Pt to report ability to AMB entry stairs of home in/out without pain increase in RLE.  Baseline:  Goal status: INITIAL  PLAN:  PT FREQUENCY: 1-2x/week   PT DURATION: 6 weeks  PLANNED INTERVENTIONS: 97110-Therapeutic exercises, 97530- Therapeutic activity, [id number]- Neuromuscular re-education, 97535- Self Care, 16109- Manual therapy, [phone]- Gait training, 97014- Electrical stimulation (unattended), [phone]- Electrical stimulation (manual), [id number]- Ultrasound, 91478- Ionotophoresis 4mg /ml Dexamethasone, Patient/Family education, Balance training, Stair training, Taping, Dry Needling, Joint mobilization, Vestibular training, DME instructions, Wheelchair mobility training, and Moist heat  PLAN FOR NEXT SESSION:  Hip strengthening, Knee strengthening; Plan for discharge  8:43 AM, 01/17/23 Karna Christmas, SPT  " I agree with the following treatment note after reviewing documentation. This session was performed under the supervision of a licensed clinician."    11:57 AM, 01/17/23 Amy Small Lynch MPT [place]  [facility] [place] [phone] [phone]

## 2023-01-20 ENCOUNTER — Ambulatory Visit (HOSPITAL_COMMUNITY): Payer: Commercial Managed Care - PPO | Admitting: Physical Therapy

## 2023-01-20 DIAGNOSIS — R262 Difficulty in walking, not elsewhere classified: Secondary | ICD-10-CM

## 2023-01-20 DIAGNOSIS — M7631 Iliotibial band syndrome, right leg: Secondary | ICD-10-CM

## 2023-01-20 NOTE — Therapy (Addendum)
OUTPATIENT PHYSICAL THERAPY TREATMENT/DISCHARGE PHYSICAL THERAPY DISCHARGE SUMMARY  Visits from Start of Care: 10  Current functional level related to goals / functional outcomes: See below   Remaining deficits: See below   Education / Equipment: HEP   Patient agrees to discharge. Patient goals were partially met. Patient is being discharged due to being pleased with the current functional level.    Patient Name: Erica Woods MRN: [id number] DOB:07-19-1965, 57 y.o., female Today's Date: 01/20/2023  END OF SESSION:  PT End of Session - 01/20/23 0920     Visit Number 10    Number of Visits 12    Date for PT Re-Evaluation 01/28/23    Authorization Type [employer]    Authorization Time Period 12/17/22    Progress Note Due on Visit 10    PT Start Time 0850    PT Stop Time 0920    PT Time Calculation (min) 30 min    Activity Tolerance Patient tolerated treatment well;No increased pain    Behavior During Therapy WFL for tasks assessed/performed               Past Medical History:  Diagnosis Date   Allergic rhinitis    Breast cancer (HCC)    Chickenpox    GERD (gastroesophageal reflux disease)    Headache    Hemorrhoids    History of blood transfusion    Hypercholesteremia    Hyperthyroidism    Past Surgical History:  Procedure Laterality Date   ABDOMINAL SURGERY     APPENDECTOMY     BALLOON DILATION N/A 02/04/2017   Procedure: BALLOON DILATION;  Surgeon: Wyline Mood, MD;  Location: [facility] ENDOSCOPY;  Service: Gastroenterology;  Laterality: N/A;   BREAST BIOPSY Left 10/02/2014   Procedure: LEFT BREAST BIOPSY AFTER NEEDLE LOCALIZATION X TWO;  Surgeon: Franky Macho, MD;  Location: [facility] ORS;  Service: General;  Laterality: Left;   BREAST LUMPECTOMY Left 2016   benign   COLONOSCOPY N/A 10/23/2014   Procedure: COLONOSCOPY;  Surgeon: Malissa Hippo, MD;  Location: [facility] ENDO SUITE;  Service: Endoscopy;  Laterality: N/A;  730   ESOPHAGOGASTRODUODENOSCOPY (EGD) WITH PROPOFOL  N/A 02/04/2017   Procedure: ESOPHAGOGASTRODUODENOSCOPY (EGD) WITH PROPOFOL;  Surgeon: Wyline Mood, MD;  Location: [facility] ENDOSCOPY;  Service: Gastroenterology;  Laterality: N/A;   ESOPHAGOGASTRODUODENOSCOPY (EGD) WITH PROPOFOL N/A 09/17/2021   Procedure: ESOPHAGOGASTRODUODENOSCOPY (EGD) WITH PROPOFOL;  Surgeon: Wyline Mood, MD;  Location: [facility] ENDOSCOPY;  Service: Endoscopy;  Laterality: N/A;   LAPAROSCOPIC APPENDECTOMY  03/02/2012   Procedure: APPENDECTOMY LAPAROSCOPIC;  Surgeon: Fabio Bering, MD;  Location: [facility] ORS;  Service: General;  Laterality: N/A;   OVARIAN CYST REMOVAL Left    ROOT CANAL  04/07/2022   TUBAL LIGATION     WISDOM TOOTH EXTRACTION Bilateral    Patient Active Problem List   Diagnosis Date Noted   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   Vitamin D deficiency 05/25/2021   Postmenopausal bleeding 11/24/2020   Rash 03/26/2020   Prediabetes 09/20/2018   Stress 11/14/2017   Hot flashes 07/29/2017   Hyperlipidemia 07/29/2017   Hypothyroidism 11/08/2016   Muscle cramps 11/08/2016   Plantar fasciitis, left 07/05/2016   Psoriasis 07/05/2016   Arthralgia of both hands 05/04/2016   History of breast cancer 11/19/2015   Routine general medical examination at a health care facility 05/02/2015   Migraines 11/25/2014   GERD (gastroesophageal reflux disease) 11/25/2014   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  Going to the gym every other  day alternating upper body and lower body. Very minimal hip discomfort, mostly only with ascending stairs. No pain currently.   PERTINENT HISTORY: Erica Woods is a 57yoF who is referred to OPPT by Rheumatology for Rt sided ITBS. Pt reports pain in both Rt lateral knee, as well as Rt posterolateral hip area, although she reports the more proximal of the 2 being 3 years chronic and the latter being ~2-3 months chronic. Pt reports bilat low back pain that is more chronic, she does not feel a part of this current issue. Pt reports Rt hip diagnosis being labeled as bursitis, chart indicates trochanteric. At baseline pt AMB 5-6 day weekly x30 minutes for physical activity, but has not does this since the Summer. 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 Pt takes 600mg  ibuprofen wehn pain is bad, this helps a lot. Pt has never taken PT services for her Right hip issue.   Pain Behavior: Current pain rated 8/10, as bad as 10/10, and as good as 6/10, but pt does not have any complete pain remission. Pt most aggravated by sitting postures and ascending stairs > descending stairs.    PMH: Arthropathy of lumbar facet joint, osteoarthritis of both feet, Osteopenia via DEXA vitamin D deficiency, Lobular carcinoma in situ (LCIS) of left breast,  GERD, HLD, hypoTSH, migraines.  PAIN:  Are you having pain? YES 8/10 Rt hip, Rt knee   PRECAUTIONS: None; osteopenia  WEIGHT BEARING RESTRICTIONS: No  FALLS:  Has patient fallen in last 6 months? N  LIVING ENVIRONMENT: Lives with: Husband, older son 5, works FT  Lives in: Art therapist home 2000sqft Stairs: 8 stairs, 2 rails   OCCUPATION: FT CNA, memory, works nights   PLOF: used to walk 3x/week   PATIENT GOALS:   NEXT MD VISIT: unknown  OBJECTIVE:  Note: Objective measures were completed at Evaluation unless otherwise noted.  DIAGNOSTIC FINDINGS:  Primary osteoarthritis of both feet - Under care of Dr. Logan Bores, arthropathy of lumbar facet joint: No  midline spinal tenderness in the lumbar region currently.  No symptoms of radiculopathy.  PATIENT SURVEYS:  *defer HOOS to visit 2  COGNITION: Overall cognitive status: WNL     SENSATION: Reports an area of numbness near the distal most 4 inches of the ITB area exclusively proximal the joint line; Pt reports during remote pregnancy having Rt sided numbness higher up in the Rt pocket distribution of the lateral femoral cutaneous nerve.   EDEMA:  Minimal to zero edema about the patella, patella ligament, lateral right knee.    PALPATION:  -Taut bands of the distal most lateral quads fiber anterior to the ITB, 1-2 inches away from patella; very tender initially, but improved with sustained Ischaemic release, multiple twitch areas noted, release following over 4 minutes.   -Rt lateral gluteals: moderately tender   -Rt greater trochanter: mildly tender, not consistent with CC pain   -Rt posterolateral gluteals: mega tender  -Bialt lumbar paras are equally tender   LOWER QUARTER EXAM:  Right eval Left eval Right 01/20/23 Left 01/20/23  Hip flexion 120 deg*  WNL    Hip extension Poor tolerance to zero degrees supine   WNL   Hip extension (supine @ 30 deg flexion)  3+/5*  4+/5 4+/5 4+5  Hip abduction 4+/5 4+/5 5/5 5/5  Hip abduction 45 deg  WNL   Hip internal rotation 30*  WNL   Hip external rotation 45  WNL     LOWER EXTREMITY SPECIAL TESTS:  -External DeRotation Test (Gluteal tendinopathy): Rt negative   FUNCTIONAL TESTS:  5xSTS: 12.28 hands free (mild pain limitations)  GAIT: -fairly antalgic and compensated -abducted RLE  -avoids most normal knee flexion in swing phase -high velocity medium amplitude trendelenburg of Rt hip during loading phase   TODAY'S TREATMENT:                                                                                                                              DATE:  01/20/23 6 mwt 1500 feet no AD 5X STS 10.7 seconds no UE MMT/ROM above  chart Goal review/discharge instructions  01/17/23: Nustep level 3 x 6 minutes  TKE with #4 plate 3 x 10  Leg press #4 plate 2 x 10 Double heel raises on leg press with #1 plate 2 x 10  Seated HS curls plate #4 2 x8 Wall sit at 135 degrees of knee flexion 3 x 20 seconds  Standing Hip ABD with 2 HHA 2 x10 bilaterally 1:1 work:rest   01/10/23:   Nustep level 3 x 6 minutes  TKE with #2 plate 2 x 10  Leg press #2 plate 2 x 10  Double heel raises on leg press with #1 plate 2 x 10  Hamstring curls #4 plate 2 E52  Wall sit at 135 degrees of knee flexion 3 x 20 seconds   TE: 01/06/23 Supine:  Moist heat to right knee 5'  SLR 2x10   ITB stretch 3 x 30 seconds  Sidelying clamshells 3 x 10 with RTB each side   Seated LAQ 3 x 8 each LE 4# AW and 3 sec hold Wall sit at 135 degrees of knee flexion 3 x 20" - felt better each round  TKE with RTB 2x10 NuStep 6' level 3 seat 7  TE: 12/31/22 NuStep 6' level 3 seat 7 Supine ITB stretch 3 x 30 seconds  Sidelying clamshells 3 x 10 with RTB each side   Seated LAQ 3 x 8 each LE 4# AW and 3 sec hold Wall sit at 135 degrees of knee flexion 3 x 20" - felt better each round  Side steps 10 steps each direction 2x - pain went up Calf stretch on slant board 3 x 30"   TE: 12/31/22 Supine ITB stretch 3 x 30 seconds  Sidelying clamshells 3 x 10 with RTB each side   Seated LAQ 2 x 15 each LE 3# AW  STS x 10 - painful  Mini squat iso 15 sec x 2 - pain free Mini lunge iso 15 sec x 2 - pain free  Heel raises x15 double leg, x10 single leg   TE: 12/29/22 Nustep level 2 x 8 minutes (seat 6) - B UE and LE   Supine ITB stretch 3 x 30 seconds  Sidelying clamshells 2 x 10 with RTB each side   Seated LAQ 3 x 10 each LE 3# AW  Sit to stand 2 x 15  Patient education on activity modifications for moving patients in a [place] lift.    TE: 12/22/22 Nustep level 2 x 6 minutes (seat 7) - B UE and LE   Supine ITB stretch 3 x 30 seconds  Sidelying clamshells 2  x 10 with RTB each side   Sit to stand 2 x 10 with 3kg ball Standing hip abduction 2 x 10 with 2# AW each LE with UE support Standing hip extension 2 x 10 with 2# AW each LE with UE support Seated LAQ 2 x 10 each LE 2# AW   PATIENT EDUCATION:  Education details: explained exam findings, association of clinical bursitis with muscle//tendon injury in the literature.  Person educated: Patient, husband  Education method: talk therapy Education comprehension: 7/10   HOME EXERCISE PROGRAM: Access Code: [email or web address] URL: [email or web address] Date: 12/21/2022 Prepared by: Maylon Peppers  Exercises - Supine ITB Stretch  - 2-3 x daily - 5-7 x weekly - 3 sets - 10 reps - Clamshell  - 2-3 x daily - 5-7 x weekly - 3 sets - 10 reps - ITB Stretch at Wall  - 2-3 x daily - 5-7 x weekly - 3 sets - 10 reps - Standing Hip Abduction with Counter Support  - 2-3 x daily - 5-7 x weekly - 3 sets - 10 reps - Standing Hip Extension with Counter Support  - 2-3 x daily - 5-7 x weekly - 3 sets - 10 reps  ASSESSMENT:  CLINICAL IMPRESSION:     Test measures completed and goals reviewed.  Pt has met all short term and 2/3 long term goals.  Pt unable to meet pain related to steps due to still having mild pain when ascending, however this is the only time she is having the pain.  PT has overall progressed well and is compliant with HEP and attending the gym regularly with her spouse.  Pt agreeable to discharge at this time.    OBJECTIVE IMPAIRMENTS: Abnormal gait, cardiopulmonary status limiting activity, decreased activity tolerance, decreased balance, decreased knowledge of use of DME, decreased mobility, difficulty walking, decreased ROM, decreased strength, impaired perceived functional ability, increased muscle spasms, and impaired sensation.   ACTIVITY LIMITATIONS: carrying, lifting, bending, sitting, standing, squatting, sleeping, stairs, transfers, and toileting  PARTICIPATION LIMITATIONS: meal  prep, cleaning, laundry, interpersonal relationship, driving, community activity, and occupation  PERSONAL FACTORS: Age, Behavior pattern, Fitness, Past/current experiences, Profession, Time since onset of injury/illness/exacerbation, and Transportation are also affecting patient's functional outcome.   REHAB POTENTIAL: Good  CLINICAL DECISION MAKING: Stable/uncomplicated  EVALUATION COMPLEXITY: Moderate   GOALS: Goals reviewed with patient? No  SHORT TERM GOALS: Target date: 01/14/23 Pt to report reduced peak pain in Rt hip during work and housework, <8/10.  Baseline: Goal status: MET  2.  Pt to report compliance with HEP for assist with core motor control and pain management  Baseline:  Goal status: MET  3.  Pt to improve 5xSTS but >1 second Baseline:  Goal status: MET  LONG TERM GOALS: Target  date: 01/28/23  KOOS score improvement >20% to reveal reduced disability from hip pain.  Baseline: visit 2  Goal status: DEFERRED (never tested)  2.  Pt to demonstrate performance of >1540ft with device ad lib, without pain increase any >2 points.  Baseline:  Goal status: MET no AD or pain  3.  Pt to demonstrate 5xSTS <11sec to show BLE power output unrestricted by Rt hip pain.  Baseline:  Goal status: MET  4.  Pt to report ability to AMB entry stairs of home in/out without pain increase in RLE.  Baseline:  Goal status: NOT MET (pain of 5/10 still with ascending, none with descending)  PLAN:  PT FREQUENCY: 1-2x/week   PT DURATION: 6 weeks  PLANNED INTERVENTIONS: 97110-Therapeutic exercises, 97530- Therapeutic activity, 97112- Neuromuscular re-education, 97535- Self Care, 16109- Manual therapy, [phone]- Gait training, 97014- Electrical stimulation (unattended), [phone]- Electrical stimulation (manual), [id number]- Ultrasound, 91478- Ionotophoresis 4mg /ml Dexamethasone, Patient/Family education, Balance training, Stair training, Taping, Dry Needling, Joint mobilization, Vestibular  training, DME instructions, Wheelchair mobility training, and Moist heat  PLAN FOR NEXT SESSION:  Discharge   Lurena Nida, PTA/CLT [facility] Ph: [phone]  9:21 AM, 01/20/23  9:54 AM, 01/25/23 Yamilette Garretson Small Lynch MPT [facility] [place] [phone]

## 2023-01-22 ENCOUNTER — Ambulatory Visit
Admission: EM | Admit: 2023-01-22 | Discharge: 2023-01-22 | Disposition: A | Discharge status: Home / Self Care | Payer: Commercial Managed Care - PPO | Attending: Nurse Practitioner | Admitting: Nurse Practitioner

## 2023-01-22 ENCOUNTER — Other Ambulatory Visit: Payer: Self-pay | Admitting: Gastroenterology

## 2023-01-22 DIAGNOSIS — B349 Viral infection, unspecified: Secondary | ICD-10-CM | POA: Insufficient documentation

## 2023-01-22 DIAGNOSIS — J069 Acute upper respiratory infection, unspecified: Secondary | ICD-10-CM | POA: Diagnosis present

## 2023-01-22 LAB — POCT RAPID STREP A (OFFICE): Rapid Strep A Screen: NEGATIVE

## 2023-01-22 LAB — POC COVID19/FLU A&B COMBO
Covid Antigen, POC: NEGATIVE
Influenza A Antigen, POC: NEGATIVE
Influenza B Antigen, POC: NEGATIVE

## 2023-01-22 MED ORDER — FLUTICASONE PROPIONATE 50 MCG/ACT NA SUSP: 2.0000 | Freq: Every day | NASAL | 0 refills | Status: AC

## 2023-01-22 MED ORDER — PROMETHAZINE-DM 6.25-15 MG/5ML PO SYRP: 5.0000 mL | ORAL_SOLUTION | Freq: Four times a day (QID) | ORAL | 0 refills | Status: DC | PRN

## 2023-01-22 NOTE — ED Triage Notes (Addendum)
Pt reports she has been having some fever, sore throat, chest discomfort chills, and body aches x 2 days.   States she feels nauseas right now.

## 2023-01-22 NOTE — ED Provider Notes (Signed)
[facility]    CSN: [id number] Arrival date & time: 01/22/23  4098      History   Chief Complaint Chief Complaint  Patient presents with   Generalized Body Aches    HPI Erica Woods is a 57 y.o. female.   The history is provided by the patient.   Patient presents for complaints of fatigue, fever, sore throat, chest discomfort, body aches, chills, and cough that has been present for the past 2 days.  Tmax around 100.  Denies headache, ear pain, wheezing, difficulty breathing, abdominal pain, nausea, vomiting, diarrhea, or rash.  Patient reports that she does feel nauseous at this time.  Reports that she has been taking over-the-counter Coricidin for her symptoms.  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   Vitamin D deficiency 05/25/2021   Postmenopausal bleeding 11/24/2020   Rash 03/26/2020   Prediabetes 09/20/2018   Stress 11/14/2017   Hot flashes 07/29/2017   Hyperlipidemia 07/29/2017   Hypothyroidism 11/08/2016   Muscle cramps 11/08/2016   Plantar fasciitis, left 07/05/2016   Psoriasis 07/05/2016   Arthralgia of both hands 05/04/2016   History of breast cancer 11/19/2015   Routine general medical examination at a health care facility 05/02/2015   Migraines 11/25/2014   GERD (gastroesophageal reflux disease) 11/25/2014   Atypical lobular hyperplasia [facility]) of left breast 11/22/2014    Past Surgical History:  Procedure Laterality Date   ABDOMINAL SURGERY     APPENDECTOMY     BALLOON DILATION N/A 02/04/2017   Procedure: Marvis Repress DILATION;  Surgeon: Wyline Mood, MD;  Location: [facility] ENDOSCOPY;  Service: Gastroenterology;  Laterality: N/A;   BREAST BIOPSY Left  10/02/2014   Procedure: LEFT BREAST BIOPSY AFTER NEEDLE LOCALIZATION X TWO;  Surgeon: Franky Macho, MD;  Location: [facility] ORS;  Service: General;  Laterality: Left;   BREAST LUMPECTOMY Left 2016   benign   COLONOSCOPY N/A 10/23/2014   Procedure: COLONOSCOPY;  Surgeon: Malissa Hippo, MD;  Location: [facility] ENDO SUITE;  Service: Endoscopy;  Laterality: N/A;  730   ESOPHAGOGASTRODUODENOSCOPY (EGD) WITH PROPOFOL N/A 02/04/2017   Procedure: ESOPHAGOGASTRODUODENOSCOPY (EGD) WITH PROPOFOL;  Surgeon: Wyline Mood, MD;  Location: [facility] ENDOSCOPY;  Service: Gastroenterology;  Laterality: N/A;   ESOPHAGOGASTRODUODENOSCOPY (EGD) WITH PROPOFOL N/A 09/17/2021   Procedure: ESOPHAGOGASTRODUODENOSCOPY (EGD) WITH PROPOFOL;  Surgeon: Wyline Mood, MD;  Location: [facility] ENDOSCOPY;  Service: Endoscopy;  Laterality: N/A;   LAPAROSCOPIC APPENDECTOMY  03/02/2012   Procedure: APPENDECTOMY LAPAROSCOPIC;  Surgeon: Fabio Bering, MD;  Location: [facility] ORS;  Service: General;  Laterality: N/A;   OVARIAN CYST REMOVAL Left    ROOT CANAL  04/07/2022   TUBAL LIGATION     WISDOM TOOTH EXTRACTION Bilateral     OB History     Gravida  2   Para      Term      Preterm      AB      Living  2      SAB      IAB      Ectopic      Multiple      Live Births  Obstetric Comments  Vaginal Deliveries           Home Medications    Prior to Admission medications   Medication Sig Start Date End Date Taking? Authorizing Provider  fluticasone (FLONASE) 50 MCG/ACT nasal spray Place 2 sprays into both nostrils daily. 01/22/23  Yes Leath-Warren, Sadie Haber, NP  promethazine-dextromethorphan (PROMETHAZINE-DM) 6.25-15 MG/5ML syrup Take 5 mLs by mouth 4 (four) times daily as needed. 01/22/23  Yes Leath-Warren, Sadie Haber, NP  aspirin-acetaminophen-caffeine (EXCEDRIN MIGRAINE) [phone] MG tablet Take 1 tablet by mouth every 6 (six) hours as needed for headache. 11/25/14   Magrinat, Valentino Hue, MD  Cyanocobalamin (VITAMIN  B-12 PO) Take by mouth daily.    [provider]  esomeprazole (NEXIUM) 20 MG capsule Take 1 capsule (20 mg total) by mouth daily at 12 noon. 02/11/22   Wyline Mood, MD  Evolocumab (REPATHA SURECLICK) 140 MG/ML SOAJ Inject 140 mg into the skin every 14 (fourteen) days. 12/17/22   Glori Luis, MD  Ixekizumab (TALTZ) 80 MG/ML SOAJ INJECT 1 PEN SUBCUTANEOUSLY  EVERY 4 WEEKS 10/25/22   Gearldine Bienenstock, PA-C  meloxicam (MOBIC) 15 MG tablet Take 1 tablet (15 mg total) by mouth daily. 11/06/21   Felecia Shelling, DPM  pravastatin (PRAVACHOL) 10 MG tablet Take 1 tablet (10 mg total) by mouth daily. 03/08/22   Glori Luis, MD  thyroid (NP THYROID) 30 MG tablet TAKE 1 & 1/2 (ONE & ONE-HALF) TABLETS BY MOUTH ONCE DAILY BEFORE BREAKFAST 02/26/22   Glori Luis, MD  triamcinolone cream (KENALOG) 0.1 % Apply to affected area twice daily as needed. 06/22/22   Gearldine Bienenstock, PA-C  VITAMIN D PO Take by mouth daily.    [provider]    Family History Family History  Problem Relation Age of Onset   Diabetes Mother    Osteoporosis Mother    Heart attack Father    Diabetes Sister    Cancer Sister    Parkinson's disease Sister    Tuberculosis Brother    Irregular heart beat Brother    Healthy Son    Irregular heart beat Son    Healthy Son    Hypertension Other    Diabetes Other     Social History Social History   Tobacco Use   Smoking status: Never    Passive exposure: Never   Smokeless tobacco: Never  Vaping Use   Vaping status: Never Used  Substance Use Topics   Alcohol use: Not Currently   Drug use: No     Allergies   Molnupiravir and Gold-containing drug products   Review of Systems Review of Systems Per HPI  Physical Exam Triage Vital Signs ED Triage Vitals  Encounter Vitals Group     BP 01/22/23 0821 119/68     Systolic BP Percentile --      Diastolic BP Percentile --      Pulse Rate 01/22/23 0821 87     Resp 01/22/23 0821 18     Temp  01/22/23 0821 99.8 F (37.7 C)     Temp Source 01/22/23 0821 Oral     SpO2 01/22/23 0821 95 %     Weight --      Height --      Head Circumference --      Peak Flow --      Pain Score 01/22/23 0822 10     Pain Loc --      Pain Education --  Exclude from Growth Chart --    No data found.  Updated Vital Signs BP 119/68 (BP Location: Right Arm)   Pulse 87   Temp 99.8 F (37.7 C) (Oral)   Resp 18   LMP 09/17/2014   SpO2 95%   Visual Acuity Right Eye Distance:   Left Eye Distance:   Bilateral Distance:    Right Eye Near:   Left Eye Near:    Bilateral Near:     Physical Exam Vitals and nursing note reviewed.  Constitutional:      General: She is not in acute distress.    Appearance: Normal appearance.  HENT:     Head: Normocephalic.     Right Ear: Tympanic membrane, ear canal and external ear normal.     Left Ear: Tympanic membrane, ear canal and external ear normal.     Nose: Congestion present.     Mouth/Throat:     Lips: Pink.     Mouth: Mucous membranes are moist.     Pharynx: Uvula midline. Posterior oropharyngeal erythema and postnasal drip present.     Tonsils: No tonsillar exudate. 1+ on the right. 1+ on the left.     Comments: Cobblestoning present to posterior oropharynx  Eyes:     Extraocular Movements: Extraocular movements intact.     Conjunctiva/sclera: Conjunctivae normal.     Pupils: Pupils are equal, round, and reactive to light.  Cardiovascular:     Rate and Rhythm: Normal rate and regular rhythm.     Pulses: Normal pulses.     Heart sounds: Normal heart sounds.  Pulmonary:     Effort: Pulmonary effort is normal. No respiratory distress.     Breath sounds: Normal breath sounds. No stridor. No wheezing, rhonchi or rales.  Abdominal:     General: Bowel sounds are normal.     Palpations: Abdomen is soft.     Tenderness: There is no abdominal tenderness.  Musculoskeletal:     Cervical back: Normal range of motion.  Lymphadenopathy:      Cervical: No cervical adenopathy.  Skin:    General: Skin is warm and dry.  Neurological:     General: No focal deficit present.     Mental Status: She is alert and oriented to person, place, and time.  Psychiatric:        Mood and Affect: Mood normal.        Behavior: Behavior normal.      UC Treatments / Results  Labs (all labs ordered are listed, but only abnormal results are displayed) Labs Reviewed  CULTURE, GROUP A STREP ([facility])  POC COVID19/FLU A&B COMBO  POCT RAPID STREP A (OFFICE)    EKG   Radiology No results found.  Procedures Procedures (including critical care time)  Medications Ordered in UC Medications - No data to display  Initial Impression / Assessment and Plan / UC Course  I have reviewed the triage vital signs and the nursing notes.  Pertinent labs & imaging results that were available during my care of the patient were reviewed by me and considered in my medical decision making (see chart for details).  The rapid strep test, COVID test, and influenza test were negative.  Throat culture is pending.  Suspect viral illness at this time.  Will provide symptomatic treatment with fluticasone 50 mcg nasal spray for nasal congestion and Promethazine DM for her cough.  She may continue over-the-counter Coricidin during the daytime for symptoms.  Supportive care recommendations were provided and discussed  with the patient to include over-the-counter analgesics, increasing fluids, allowing for plenty of rest, warm salt water gargles, and use of a humidifier.  Discussed viral etiology with the patient and when follow-up may be indicated.  Patient was in agreement with this plan of care and verbalized understanding.  All questions were answered.  Patient stable for discharge.  Work note was provided. Final Clinical Impressions(s) / UC Diagnoses   Final diagnoses:  Viral upper respiratory tract infection with cough  Viral illness     Discharge Instructions       The rapid strep test, COVID/flu test were negative.  Throat culture is pending.  You will be contacted if the pending test result is abnormal.  You also have access to the results via MyChart. Take medication as prescribed.  You may continue Coricidin HBP during the daytime to help with your cough. Increase fluids and allow for plenty of rest. Warm salt water gargles 3-4 times daily as needed for throat pain or discomfort. Recommend using normal saline nasal spray throughout the day to help with nasal congestion and runny nose. For your cough, recommend using a humidifier in your bedroom at nighttime during sleep and sleeping elevated on pillows while cough symptoms persist. If your symptoms have not improved over the next 5 to 7 days, or appear to be worsening, you may follow-up in this clinic or with your primary care physician for further evaluation. Follow-up as needed.     ED Prescriptions     Medication Sig Dispense Auth. Provider   fluticasone (FLONASE) 50 MCG/ACT nasal spray Place 2 sprays into both nostrils daily. 16 g Leath-Warren, Sadie Haber, NP   promethazine-dextromethorphan (PROMETHAZINE-DM) 6.25-15 MG/5ML syrup Take 5 mLs by mouth 4 (four) times daily as needed. 118 mL Leath-Warren, Sadie Haber, NP      PDMP not reviewed this encounter.   Abran Cantor, NP 01/22/23 [phone]

## 2023-01-22 NOTE — Discharge Instructions (Addendum)
The rapid strep test, COVID/flu test were negative.  Throat culture is pending.  You will be contacted if the pending test result is abnormal.  You also have access to the results via MyChart. Take medication as prescribed.  You may continue Coricidin HBP during the daytime to help with your cough. Increase fluids and allow for plenty of rest. Warm salt water gargles 3-4 times daily as needed for throat pain or discomfort. Recommend using normal saline nasal spray throughout the day to help with nasal congestion and runny nose. For your cough, recommend using a humidifier in your bedroom at nighttime during sleep and sleeping elevated on pillows while cough symptoms persist. If your symptoms have not improved over the next 5 to 7 days, or appear to be worsening, you may follow-up in this clinic or with your primary care physician for further evaluation. Follow-up as needed.

## 2023-01-24 ENCOUNTER — Encounter: Payer: Self-pay | Admitting: Rheumatology

## 2023-01-24 ENCOUNTER — Encounter (HOSPITAL_COMMUNITY): Payer: Commercial Managed Care - PPO

## 2023-01-24 DIAGNOSIS — L4050 Arthropathic psoriasis, unspecified: Secondary | ICD-10-CM

## 2023-01-24 DIAGNOSIS — L409 Psoriasis, unspecified: Secondary | ICD-10-CM

## 2023-01-24 DIAGNOSIS — Z79899 Other long term (current) drug therapy: Secondary | ICD-10-CM

## 2023-01-24 MED ORDER — TALTZ 80 MG/ML ~~LOC~~ SOAJ: SUBCUTANEOUS | 2 refills | Status: DC

## 2023-01-24 NOTE — Telephone Encounter (Signed)
Last Fill: 10/25/2022  Labs: 12/08/2022 Glucose is 108. Rest of CMP WNL CBC WNL   TB Gold: 03/25/2022 Neg    Next Visit: 05/16/2023  Last Visit: 12/08/2022  [id number] arthritis   Current Dose per office note 12/08/2022: Taltz 80 mg subcutaneous injections every 4 weeks.   Okay to refill Taltz?

## 2023-01-25 LAB — CULTURE, GROUP A STREP (THRC)

## 2023-01-27 ENCOUNTER — Telehealth: Payer: Self-pay | Admitting: Family Medicine

## 2023-01-27 ENCOUNTER — Encounter (HOSPITAL_COMMUNITY): Payer: Commercial Managed Care - PPO

## 2023-01-27 ENCOUNTER — Ambulatory Visit: Payer: Commercial Managed Care - PPO | Admitting: Family

## 2023-01-27 ENCOUNTER — Ambulatory Visit (INDEPENDENT_AMBULATORY_CARE_PROVIDER_SITE_OTHER): Payer: Commercial Managed Care - PPO

## 2023-01-27 VITALS — BP 130/72 | HR 73 | Temp 96.2°F | Ht 64.0 in | Wt 155.8 lb

## 2023-01-27 DIAGNOSIS — J40 Bronchitis, not specified as acute or chronic: Secondary | ICD-10-CM

## 2023-01-27 MED ORDER — ALBUTEROL SULFATE HFA 108 (90 BASE) MCG/ACT IN AERS: 2.0000 | INHALATION_SPRAY | Freq: Four times a day (QID) | RESPIRATORY_TRACT | 0 refills | Status: DC | PRN

## 2023-01-27 MED ORDER — GUAIFENESIN-CODEINE 100-10 MG/5ML PO SOLN: 5.0000 mL | Freq: Two times a day (BID) | ORAL | 0 refills | Status: DC | PRN

## 2023-01-27 MED ORDER — GUAIFENESIN-CODEINE 100-6.33 MG/5ML PO SOLN: 5.0000 mL | Freq: Two times a day (BID) | ORAL | 0 refills | Status: DC | PRN

## 2023-01-27 MED ORDER — AZITHROMYCIN 250 MG PO TABS: ORAL_TABLET | ORAL | 0 refills | Status: AC

## 2023-01-27 MED ORDER — BENZONATATE 100 MG PO CAPS: 100.0000 mg | ORAL_CAPSULE | Freq: Three times a day (TID) | ORAL | 0 refills | Status: DC | PRN

## 2023-01-27 NOTE — Telephone Encounter (Signed)
Sending to the provider that saw the patient for this issue.

## 2023-01-27 NOTE — Progress Notes (Signed)
Assessment & Plan:  Bronchitis Assessment & Plan: Afebrile. No acute respiratory distress. Patient is having coughing episodes while in the exam room and when duoneb started; discontinued duoneb as appeared to cause her to cough more. Sa02 99% while in room and while on duoneb.   Concern for bacterial URI.  Start azithromycin, albuterol.  Provided patient with 2 cough medications Tessalon and Cheratussin.  Advise she may use Cheratussin twice daily as needed due to severity of her cough.pending stat CXR.  Patient has been on prednisone before.  Consider prednisone taper if needed  Orders: -     DG Chest 2 View; Future -     Azithromycin; Take 2 tablets on day 1, then 1 tablet daily on days 2 through 5  Dispense: 6 tablet; Refill: 0 -     guaiFENesin-Codeine; Take 5 mLs by mouth 2 (two) times daily as needed.  Dispense: 70 mL; Refill: 0 -     Benzonatate; Take 1 capsule (100 mg total) by mouth 3 (three) times daily as needed for cough.  Dispense: 30 capsule; Refill: 0 -     Albuterol Sulfate HFA; Inhale 2 puffs into the lungs every 6 (six) hours as needed for wheezing or shortness of breath.  Dispense: 8 g; Refill: 0     Return precautions given.   Risks, benefits, and alternatives of the medications and treatment plan prescribed today were discussed, and patient expressed understanding.   Education regarding symptom management and diagnosis given to patient on AVS either electronically or printed.  No follow-ups on file.  Rennie Plowman, FNP  Subjective:    Patient ID: Erica Woods, female    DOB: 06/29/1965, 57 y.o.   MRN: [id number]  CC: Erica Woods is a 57 y.o. female who presents today for an acute visit.    HPI: Accompanied by her husband.  Complains of productive cough for 8 days, worsening Endorses nasal congestion.   She will feel short of breath during coughing episode, otherwise, denies shortness of breath.  She complains of chest wall pain when coughing as she has  been 'coughing so much'.   Denies sinus pain, ear pain, sore throat, wheezing, fever, chest pain, leg swelling.  She has tried Coricidin, fluticasone nasal spray without relief.   History of GERD, hypothyroidism, breast cancer Seen at urgent care 5 days ago for viral URI.  Negative for strep, COVID and influenza.  Advised fluticasone, Promethazine DM, Coricidin No history of smoking  Allergies: Molnupiravir and Gold-containing drug products Current Outpatient Medications on File Prior to Visit  Medication Sig Dispense Refill   aspirin-acetaminophen-caffeine (EXCEDRIN MIGRAINE) 250-250-65 MG tablet Take 1 tablet by mouth every 6 (six) hours as needed for headache. 30 tablet 0   Cyanocobalamin (VITAMIN B-12 PO) Take by mouth daily.     esomeprazole (NEXIUM) 20 MG capsule TAKE 1 CAPSULE BY MOUTH AT NOON 90 capsule 0   Evolocumab (REPATHA SURECLICK) 140 MG/ML SOAJ Inject 140 mg into the skin every 14 (fourteen) days. 2 mL 2   fluticasone (FLONASE) 50 MCG/ACT nasal spray Place 2 sprays into both nostrils daily. 16 g 0   ixekizumab (TALTZ) 80 MG/ML pen INJECT 1 PEN SUBCUTANEOUSLY  EVERY 4 WEEKS 1 mL 2   meloxicam (MOBIC) 15 MG tablet Take 1 tablet (15 mg total) by mouth daily. 30 tablet 1   pravastatin (PRAVACHOL) 10 MG tablet Take 1 tablet (10 mg total) by mouth daily. 90 tablet 1   thyroid (NP THYROID) 30 MG  tablet TAKE 1 & 1/2 (ONE & ONE-HALF) TABLETS BY MOUTH ONCE DAILY BEFORE BREAKFAST 135 tablet 1   triamcinolone cream (KENALOG) 0.1 % Apply to affected area twice daily as needed. 30 g 0   VITAMIN D PO Take by mouth daily.     Current Facility-Administered Medications on File Prior to Visit  Medication Dose Route Frequency Provider Last Rate Last Admin   cyanocobalamin ((VITAMIN B-12)) injection 1,000 mcg  1,000 mcg Intramuscular Once Glori Luis, MD        Review of Systems  Constitutional:  Negative for chills and fever.  HENT:  Positive for congestion. Negative for sinus  pain and sore throat.   Respiratory:  Positive for cough and shortness of breath. Negative for wheezing.   Cardiovascular:  Negative for chest pain and palpitations.  Gastrointestinal:  Negative for nausea and vomiting.      Objective:    BP 130/72   Pulse 73   Temp (!) 96.2 F (35.7 C) (Oral)   Ht 5\' 4"  (1.626 m)   Wt 155 lb 12.8 oz (70.7 kg)   LMP 09/17/2014   SpO2 98%   BMI 26.74 kg/m   BP Readings from Last 3 Encounters:  01/27/23 130/72  01/22/23 119/68  12/15/22 126/80   Wt Readings from Last 3 Encounters:  01/27/23 155 lb 12.8 oz (70.7 kg)  12/15/22 155 lb 3.2 oz (70.4 kg)  12/08/22 158 lb 12.8 oz (72 kg)    Physical Exam Vitals reviewed.  Constitutional:      Appearance: She is well-developed.  HENT:     Head: Normocephalic and atraumatic.     Right Ear: Hearing, tympanic membrane, ear canal and external ear normal. No decreased hearing noted. No drainage, swelling or tenderness. No middle ear effusion. No foreign body. Tympanic membrane is not erythematous or bulging.     Left Ear: Hearing, tympanic membrane, ear canal and external ear normal. No decreased hearing noted. No drainage, swelling or tenderness.  No middle ear effusion. No foreign body. Tympanic membrane is not erythematous or bulging.     Nose: Nose normal. No rhinorrhea.     Right Sinus: No maxillary sinus tenderness or frontal sinus tenderness.     Left Sinus: No maxillary sinus tenderness or frontal sinus tenderness.     Mouth/Throat:     Pharynx: Uvula midline. No oropharyngeal exudate or posterior oropharyngeal erythema.     Tonsils: No tonsillar abscesses.  Eyes:     Conjunctiva/sclera: Conjunctivae normal.  Cardiovascular:     Rate and Rhythm: Regular rhythm.     Pulses: Normal pulses.     Heart sounds: Normal heart sounds.  Pulmonary:     Effort: Pulmonary effort is normal.     Breath sounds: Normal breath sounds. No wheezing, rhonchi or rales.  Lymphadenopathy:     Head:     Right  side of head: No submental, submandibular, tonsillar, preauricular, posterior auricular or occipital adenopathy.     Left side of head: No submental, submandibular, tonsillar, preauricular, posterior auricular or occipital adenopathy.     Cervical: No cervical adenopathy.  Skin:    General: Skin is warm and dry.  Neurological:     Mental Status: She is alert.  Psychiatric:        Speech: Speech normal.        Behavior: Behavior normal.        Thought Content: Thought content normal.   Of note, Patient unable to complete duo neb treatment due  to aggravation of cough.

## 2023-01-27 NOTE — Telephone Encounter (Signed)
I have sent a new prescription for patient guaifenesin-codeine 100-10 mg

## 2023-01-27 NOTE — Patient Instructions (Signed)
I would like for you to start an antibiotic, azithromycin.  Please ensure that you are taking probiotics such as yogurt or Culturelle while on an antibiotic  I have also sent TWO options for cough.  You may take Tessalon Perles during the day ; these are the least sedating.  However, I have also prescribed a more potent cough syrup which includes codeine (narcotic) to help quiet cough.  I wrote a prescription so that you are able to take the cough syrup twice daily as needed.  It can be quite sedating.  Please monitor for excessive sedation and do not take with alcohol.  I have also sent in an albuterol inhaler for you to use as needed.  For shortness of breath chest pain, please call 911 or report to nearest emergency room

## 2023-01-27 NOTE — Telephone Encounter (Signed)
[facility] called stating they do not have the strength of the guaiFENesin-Codeine that the provider wants. They have the 100mg  and 10 mg of codeine they just don't have the 6.33 of codeine

## 2023-01-27 NOTE — Assessment & Plan Note (Addendum)
Afebrile. No acute respiratory distress. Patient is having coughing episodes while in the exam room and when duoneb started; discontinued duoneb as appeared to cause her to cough more. Sa02 99% while in room and while on duoneb.   Concern for bacterial URI.  Start azithromycin, albuterol.  Provided patient with 2 cough medications Tessalon and Cheratussin.  Advise she may use Cheratussin twice daily as needed due to severity of her cough.pending stat CXR.  Patient has been on prednisone before.  Consider prednisone taper if needed

## 2023-01-27 NOTE — Addendum Note (Signed)
Addended by: Allegra Grana on: 01/27/2023 04:06 PM   Modules accepted: Orders

## 2023-02-04 ENCOUNTER — Telehealth: Payer: Self-pay | Admitting: Pharmacist

## 2023-02-04 NOTE — Telephone Encounter (Signed)
Received fax from [facility] stating patient's Altamease Oiler requires PA.  Previous authorization appears to be for 2-pack.  Submitted a Prior Authorization request to [employer] for TALTZ via [employer] portal. Will update once we receive a response.  EOC ID: [id number]

## 2023-02-04 NOTE — Telephone Encounter (Signed)
Received notification from [facility] regarding a prior authorization for TALTZ. Authorization has been APPROVED from 02/04/2023 to 10/2023. Approval letter sent to scan center.   Patient must continue to fill through [facility]: [phone]   Authorization # [id number]  Chesley Mires, PharmD, MPH, BCPS, CPP Clinical Pharmacist (Rheumatology and Pulmonology)

## 2023-03-04 ENCOUNTER — Other Ambulatory Visit: Payer: Self-pay | Admitting: *Deleted

## 2023-03-04 DIAGNOSIS — Z111 Encounter for screening for respiratory tuberculosis: Secondary | ICD-10-CM

## 2023-03-04 DIAGNOSIS — E785 Hyperlipidemia, unspecified: Secondary | ICD-10-CM

## 2023-03-04 DIAGNOSIS — Z79899 Other long term (current) drug therapy: Secondary | ICD-10-CM

## 2023-03-04 DIAGNOSIS — Z9225 Personal history of immunosupression therapy: Secondary | ICD-10-CM

## 2023-03-09 LAB — CBC WITH DIFFERENTIAL/PLATELET
Absolute Lymphocytes: 2244 {cells}/uL (ref 850–3900)
Absolute Monocytes: 568 {cells}/uL (ref 200–950)
Basophils Absolute: 59 {cells}/uL (ref 0–200)
Basophils Relative: 0.9 %
Eosinophils Absolute: 211 {cells}/uL (ref 15–500)
Eosinophils Relative: 3.2 %
HCT: 40.4 % (ref 35.0–45.0)
Hemoglobin: 13.3 g/dL (ref 11.7–15.5)
MCH: 31 pg (ref 27.0–33.0)
MCHC: 32.9 g/dL (ref 32.0–36.0)
MCV: 94.2 fL (ref 80.0–100.0)
MPV: 11.4 fL (ref 7.5–12.5)
Monocytes Relative: 8.6 %
Neutro Abs: 3518 {cells}/uL (ref 1500–7800)
Neutrophils Relative %: 53.3 %
Platelets: 289 10*3/uL (ref 140–400)
RBC: 4.29 10*6/uL (ref 3.80–5.10)
RDW: 13.1 % (ref 11.0–15.0)
Total Lymphocyte: 34 %
WBC: 6.6 10*3/uL (ref 3.8–10.8)

## 2023-03-09 LAB — COMPLETE METABOLIC PANEL WITH GFR
AG Ratio: 1.8 (calc) (ref 1.0–2.5)
ALT: 18 U/L (ref 6–29)
AST: 17 U/L (ref 10–35)
Albumin: 4.2 g/dL (ref 3.6–5.1)
Alkaline phosphatase (APISO): 69 U/L (ref 37–153)
BUN: 18 mg/dL (ref 7–25)
CO2: 28 mmol/L (ref 20–32)
Calcium: 9.4 mg/dL (ref 8.6–10.4)
Chloride: 103 mmol/L (ref 98–110)
Creat: 0.72 mg/dL (ref 0.50–1.03)
Globulin: 2.4 g/dL (ref 1.9–3.7)
Glucose, Bld: 85 mg/dL (ref 65–99)
Potassium: 4.6 mmol/L (ref 3.5–5.3)
Sodium: 140 mmol/L (ref 135–146)
Total Bilirubin: 0.4 mg/dL (ref 0.2–1.2)
Total Protein: 6.6 g/dL (ref 6.1–8.1)
eGFR: 97 mL/min/{1.73_m2} (ref >=60)

## 2023-03-09 LAB — QUANTIFERON-TB GOLD PLUS
Mitogen-NIL: 9.74 [IU]/mL
NIL: 0.06 [IU]/mL
QuantiFERON-TB Gold Plus: NEGATIVE
TB1-NIL: 0.02 [IU]/mL
TB2-NIL: 0.06 [IU]/mL

## 2023-03-09 NOTE — Progress Notes (Signed)
TB Gold negative

## 2023-04-22 ENCOUNTER — Other Ambulatory Visit: Payer: Self-pay | Admitting: Gastroenterology

## 2023-04-24 ENCOUNTER — Other Ambulatory Visit: Payer: Self-pay | Admitting: Rheumatology

## 2023-04-24 DIAGNOSIS — L4050 Arthropathic psoriasis, unspecified: Secondary | ICD-10-CM

## 2023-04-24 DIAGNOSIS — Z79899 Other long term (current) drug therapy: Secondary | ICD-10-CM

## 2023-04-24 DIAGNOSIS — L409 Psoriasis, unspecified: Secondary | ICD-10-CM

## 2023-04-25 NOTE — Telephone Encounter (Signed)
 Last Fill: 01/24/2023  Labs: 03/04/2023  CBC and CMP WNL   TB Gold: 03/04/2023  TB Gold negative   Next Visit: 05/09/2023  Last Visit: 12/08/2022  [id number] arthritis   Current Dose per office note 12/08/2022: Taltz 80 mg subcutaneous injections every 4 weeks   Okay to refill Taltz?

## 2023-04-25 NOTE — Progress Notes (Signed)
 Office Visit Note  Patient: Erica Woods             Date of Birth: 15-Sep-1965           MRN: [id number]             PCP: Glori Luis, MD (Inactive) Referring: Glori Luis, MD Visit Date: 05/09/2023 Occupation: @GUAROCC @  Subjective:  Medication management  History of Present Illness: Erica Woods is a 58 y.o. female with psoriatic arthritis, psoriasis and osteoarthritis.  She returns for follow-up visit today.  She states she went for physical therapy which helped her back pain and IT band region.  She is also had noticed improvement in the plantar fasciitis.  She has been on Taltz 80 mg subcu every 4 weeks without any interruption.  She denies any side effects.  She continues to have breakout of psoriasis on her hands, wrists and elbows.  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    Activities of Daily Living:  Patient reports morning stiffness for 20 minutes.   Patient Denies nocturnal pain.  Difficulty dressing/grooming: Denies Difficulty climbing stairs: Denies Difficulty getting out of chair: Denies Difficulty using hands for taps, buttons, cutlery, and/or writing: Denies  Review of Systems  Constitutional:  Negative for fatigue.  HENT:  Negative for mouth sores and mouth dryness.   Eyes:  Positive for dryness.  Respiratory:  Negative for shortness of breath.   Cardiovascular:  Negative for chest pain and palpitations.  Gastrointestinal:  Negative for blood in stool, constipation and diarrhea.  Endocrine: Negative for increased urination.  Genitourinary:  Negative for involuntary urination.  Musculoskeletal:  Positive for joint swelling and morning stiffness. Negative for joint pain, gait problem, joint pain, myalgias, muscle weakness, muscle tenderness and myalgias.  Skin:  Negative for color change, rash, hair loss and sensitivity to sunlight.   Allergic/Immunologic: Negative for susceptible to infections.  Neurological:  Negative for dizziness and headaches.  Hematological:  Negative for swollen glands.  Psychiatric/Behavioral:  Negative for depressed mood and sleep disturbance. The patient is not nervous/anxious.     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   Vitamin D deficiency 05/25/2021   Postmenopausal bleeding 11/24/2020   Rash 03/26/2020   Prediabetes 09/20/2018   Stress 11/14/2017   Hot flashes 07/29/2017   Hyperlipidemia 07/29/2017   Hypothyroidism 11/08/2016   Muscle cramps 11/08/2016   Plantar fasciitis, left 07/05/2016   Psoriasis 07/05/2016   Arthralgia of both hands 05/04/2016   History of breast cancer 11/19/2015   Routine general medical examination at a health care facility 05/02/2015   Migraines 11/25/2014   GERD (gastroesophageal reflux disease) 11/25/2014   Atypical lobular hyperplasia [facility]) of left breast 11/22/2014    Past Medical History:  Diagnosis Date   Allergic rhinitis    Breast cancer (HCC)    Chickenpox    GERD (gastroesophageal reflux disease)    Headache    Hemorrhoids    History of blood transfusion    Hypercholesteremia    Hyperthyroidism     Family History  Problem Relation Age of Onset   Diabetes Mother    Osteoporosis Mother    Heart attack Father    Diabetes Sister    Cancer Sister    Parkinson's disease  Sister    Tuberculosis Brother    Irregular heart beat Brother    Healthy Son    Irregular heart beat Son    Healthy Son    Hypertension Other    Diabetes Other    Past Surgical History:  Procedure Laterality Date   ABDOMINAL SURGERY     APPENDECTOMY     BALLOON DILATION N/A 02/04/2017   Procedure: Marvis Repress DILATION;  Surgeon: Wyline Mood, MD;  Location: [facility] ENDOSCOPY;  Service: Gastroenterology;  Laterality: N/A;   BREAST BIOPSY Left 10/02/2014   Procedure: LEFT BREAST  BIOPSY AFTER NEEDLE LOCALIZATION X TWO;  Surgeon: Franky Macho, MD;  Location: [facility] ORS;  Service: General;  Laterality: Left;   BREAST LUMPECTOMY Left 2016   benign   COLONOSCOPY N/A 10/23/2014   Procedure: COLONOSCOPY;  Surgeon: Malissa Hippo, MD;  Location: [facility] ENDO SUITE;  Service: Endoscopy;  Laterality: N/A;  730   ESOPHAGOGASTRODUODENOSCOPY (EGD) WITH PROPOFOL N/A 02/04/2017   Procedure: ESOPHAGOGASTRODUODENOSCOPY (EGD) WITH PROPOFOL;  Surgeon: Wyline Mood, MD;  Location: [facility] ENDOSCOPY;  Service: Gastroenterology;  Laterality: N/A;   ESOPHAGOGASTRODUODENOSCOPY (EGD) WITH PROPOFOL N/A 09/17/2021   Procedure: ESOPHAGOGASTRODUODENOSCOPY (EGD) WITH PROPOFOL;  Surgeon: Wyline Mood, MD;  Location: [facility] ENDOSCOPY;  Service: Endoscopy;  Laterality: N/A;   LAPAROSCOPIC APPENDECTOMY  03/02/2012   Procedure: APPENDECTOMY LAPAROSCOPIC;  Surgeon: Fabio Bering, MD;  Location: [facility] ORS;  Service: General;  Laterality: N/A;   OVARIAN CYST REMOVAL Left    ROOT CANAL  04/07/2022   TUBAL LIGATION     WISDOM TOOTH EXTRACTION Bilateral    Social History   Social History Narrative   Not on file   Immunization History  Administered Date(s) Administered   Influenza,inj,Quad PF,6+ Mos 11/14/2017, 11/24/2020, 12/10/2021   Influenza-Unspecified 11/17/2015, 12/12/2018, 12/03/2019   Moderna Sars-Covid-2 Vaccination 06/05/2019, 07/03/2019, 01/18/2020, 07/04/2020   PNEUMOCOCCAL CONJUGATE-20 11/24/2020   Tdap 05/02/2015   Zoster Recombinant(Shingrix) 05/09/2017, 07/12/2017     Objective: Vital Signs: BP 123/74 (BP Location: Left Arm, Patient Position: Sitting, Cuff Size: Large)   Pulse (!) 53   Resp 14   Ht 5\' 3"  (1.6 m)   Wt 154 lb (69.9 kg)   LMP 09/17/2014   BMI 27.28 kg/m    Physical Exam Vitals and nursing note reviewed.  Constitutional:      Appearance: She is well-developed.  HENT:     Head: Normocephalic and atraumatic.  Eyes:     Conjunctiva/sclera: Conjunctivae normal.   Cardiovascular:     Rate and Rhythm: Normal rate and regular rhythm.     Heart sounds: Normal heart sounds.  Pulmonary:     Effort: Pulmonary effort is normal.     Breath sounds: Normal breath sounds.  Abdominal:     General: Bowel sounds are normal.     Palpations: Abdomen is soft.  Musculoskeletal:     Cervical back: Normal range of motion.  Lymphadenopathy:     Cervical: No cervical adenopathy.  Skin:    General: Skin is warm and dry.     Capillary Refill: Capillary refill takes less than 2 seconds.     Comments: Psoriasis patches both hands and elbows  Neurological:     Mental Status: She is alert and oriented to person, place, and time.  Psychiatric:        Behavior: Behavior normal.      Musculoskeletal Exam: Cervical, thoracic and lumbar spine were in good range of motion.  Thoracic kyphosis was noted.  There was no point tenderness  over thoracic or lumbar region.  There was no SI joint tenderness.  Shoulders, elbows, wrists, MCPs PIPs and DIPs were in good range of motion without any warmth swelling or effusion.  No synovitis was noted.  Hip joints and knee joints with good range of motion.  There was no tenderness over trochanteric region.  No warmth swelling or effusion was noted in the knee joints.  There was no plantar fasciitis or Achilles tendinitis.  There was no tenderness over MTPs.  CDAI Exam: CDAI Score: -- Patient Global: --; Provider Global: -- Swollen: --; Tender: -- Joint Exam 05/09/2023   No joint exam has been documented for this visit   There is currently no information documented on the homunculus. Go to the Rheumatology activity and complete the homunculus joint exam.  Investigation: No additional findings.  Imaging: No results found.  Recent Labs: Lab Results  Component Value Date   WBC 6.6 03/04/2023   HGB 13.3 03/04/2023   PLT 289 03/04/2023   NA 140 03/04/2023   K 4.6 03/04/2023   CL 103 03/04/2023   CO2 28 03/04/2023   GLUCOSE 85  03/04/2023   BUN 18 03/04/2023   CREATININE 0.72 03/04/2023   BILITOT 0.4 03/04/2023   ALKPHOS 71 07/27/2022   AST 17 03/04/2023   ALT 18 03/04/2023   PROT 6.6 03/04/2023   ALBUMIN 4.3 07/27/2022   CALCIUM 9.4 03/04/2023   GFRAA 115 06/16/2020   QFTBGOLDPLUS NEGATIVE 03/04/2023    Speciality Comments: MTX tabs 04/14/20-07/26/20 (stopped d/t nausea from folic acid); switched to Tremfya 08/04/21 Taltz 02/24/21  Procedures:  No procedures performed Allergies: Molnupiravir and Gold-containing drug products   Assessment / Plan:     Visit Diagnoses: Psoriatic arthritis (HCC)-patient denies having a flare of psoriatic arthritis.  No synovitis was noted on the examination.  She denies any episodes of Achilles tendinitis, plantar fasciitis, uveitis.  She continues to take Taltz 80 mg subcu every 4 weeks without any interruption.  She has noticed improvement in trochanteric bursitis and SI joint pain since she had been for physical therapy.  She been doing stretching exercises at home.  Psoriasis-psoriasis patches was noted on her hands and her wrist.  She also had some psoriasis patches on her elbows.  Patient states she has not been using topical agents as she has to wash her hands constantly.  High risk medication use - Taltz 80 mg subcutaneous injections every 4 weeks.  Previous therapy: MTX and tremfya.  March 04, 2023 CBC and CMP were normal.  TB Gold was negative on March 04, 2023.  She was advised to get labs every 3 months and TB Gold annually.  Information minimization was placed in the AVS.  She was advised to hold Taltz if she develops an infection resume after the infection resolves.  Primary osteoarthritis of both hands-she has arthritic changes without any synovitis.  Chronic right SI joint pain-improved after physical therapy and stretches.  Plantar fasciitis, bilateral-no recent episode.  Trochanteric bursitis of both hips - Right >left.  She has infrequent episodes which  improved after the physical therapy.  Primary osteoarthritis of both feet - Under care of Dr. Logan Bores.  She denies discomfort today.  Arthropathy of lumbar facet joint-she mobility in her lumbar spine without discomfort.  Osteopenia of multiple sites - 02/11/2022 DEXA scan T-score -1.8, BMD 0.795 left femoral neck.  Use of calcium rich diet and vitamin D was advised.  Need for regular exercise was emphasized.  Vitamin D deficiency-she has  been taking vitamin D.  Vitamin D was 40.20 and on December 15, 2022.  Other medical problems are listed as follows:  Lobular carcinoma in situ (LCIS) of left breast  Prediabetes  History of hyperlipidemia-risk of heart disease with psoriatic arthritis was discussed.  A handout on heart disease prevention was given.  History of gastroesophageal reflux (GERD)  History of hypothyroidism  Hx of migraines  Orders: No orders of the defined types were placed in this encounter.  No orders of the defined types were placed in this encounter.    Follow-Up Instructions: Return in about 5 months (around 10/09/2023) for Psoriatic arthritis.   Pollyann Savoy, MD  Note - This record has been created using Animal nutritionist.  Chart creation errors have been sought, but may not always  have been located. Such creation errors do not reflect on  the standard of medical care.

## 2023-04-26 ENCOUNTER — Telehealth: Payer: Self-pay | Admitting: Family Medicine

## 2023-04-26 NOTE — Telephone Encounter (Signed)
 Called pt to call back and get appointment for a TOC slot due to provider Birdie Sons has left the office and pt need to be scheduled with Dr. Charlann Lange. Please advise.

## 2023-05-09 ENCOUNTER — Ambulatory Visit: Payer: Commercial Managed Care - PPO | Attending: Rheumatology | Admitting: Rheumatology

## 2023-05-09 ENCOUNTER — Encounter: Payer: Self-pay | Admitting: Rheumatology

## 2023-05-09 VITALS — BP 123/74 | HR 53 | Resp 14 | Ht 63.0 in | Wt 154.0 lb

## 2023-05-09 DIAGNOSIS — M8589 Other specified disorders of bone density and structure, multiple sites: Secondary | ICD-10-CM

## 2023-05-09 DIAGNOSIS — E559 Vitamin D deficiency, unspecified: Secondary | ICD-10-CM

## 2023-05-09 DIAGNOSIS — M533 Sacrococcygeal disorders, not elsewhere classified: Secondary | ICD-10-CM

## 2023-05-09 DIAGNOSIS — Z79899 Other long term (current) drug therapy: Secondary | ICD-10-CM | POA: Diagnosis not present

## 2023-05-09 DIAGNOSIS — M7061 Trochanteric bursitis, right hip: Secondary | ICD-10-CM

## 2023-05-09 DIAGNOSIS — L409 Psoriasis, unspecified: Secondary | ICD-10-CM | POA: Diagnosis not present

## 2023-05-09 DIAGNOSIS — R7303 Prediabetes: Secondary | ICD-10-CM

## 2023-05-09 DIAGNOSIS — M722 Plantar fascial fibromatosis: Secondary | ICD-10-CM

## 2023-05-09 DIAGNOSIS — M19042 Primary osteoarthritis, left hand: Secondary | ICD-10-CM

## 2023-05-09 DIAGNOSIS — L4050 Arthropathic psoriasis, unspecified: Secondary | ICD-10-CM | POA: Diagnosis not present

## 2023-05-09 DIAGNOSIS — Z8639 Personal history of other endocrine, nutritional and metabolic disease: Secondary | ICD-10-CM

## 2023-05-09 DIAGNOSIS — M19072 Primary osteoarthritis, left ankle and foot: Secondary | ICD-10-CM

## 2023-05-09 DIAGNOSIS — M7631 Iliotibial band syndrome, right leg: Secondary | ICD-10-CM

## 2023-05-09 DIAGNOSIS — M19041 Primary osteoarthritis, right hand: Secondary | ICD-10-CM | POA: Diagnosis not present

## 2023-05-09 DIAGNOSIS — Z8669 Personal history of other diseases of the nervous system and sense organs: Secondary | ICD-10-CM

## 2023-05-09 DIAGNOSIS — Z8719 Personal history of other diseases of the digestive system: Secondary | ICD-10-CM

## 2023-05-09 DIAGNOSIS — D0502 Lobular carcinoma in situ of left breast: Secondary | ICD-10-CM

## 2023-05-09 DIAGNOSIS — M19071 Primary osteoarthritis, right ankle and foot: Secondary | ICD-10-CM

## 2023-05-09 DIAGNOSIS — M7062 Trochanteric bursitis, left hip: Secondary | ICD-10-CM

## 2023-05-09 DIAGNOSIS — G8929 Other chronic pain: Secondary | ICD-10-CM

## 2023-05-09 DIAGNOSIS — M47816 Spondylosis without myelopathy or radiculopathy, lumbar region: Secondary | ICD-10-CM

## 2023-05-09 NOTE — Patient Instructions (Signed)
 Standing Labs We placed an order today for your standing lab work.   Please have your standing labs drawn in April and every 3 months  Please have your labs drawn 2 weeks prior to your appointment so that the provider can discuss your lab results at your appointment, if possible.  Please note that you may see your imaging and lab results in MyChart before we have reviewed them. We will contact you once all results are reviewed. Please allow our office up to 72 hours to thoroughly review all of the results before contacting the office for clarification of your results.  WALK-IN LAB HOURS  Monday through Thursday from 8:00 am -12:30 pm and 1:00 pm-5:00 pm and Friday from 8:00 am-12:00 pm.  Patients with office visits requiring labs will be seen before walk-in labs.  You may encounter longer than normal wait times. Please allow additional time. Wait times may be shorter on  Monday and Thursday afternoons.  We do not book appointments for walk-in labs. We appreciate your patience and understanding with our staff.   Labs are drawn by [facility]. Please bring your co-pay at the time of your lab draw.  You may receive a bill from [facility] for your lab work.  Please note if you are on Hydroxychloroquine and and an order has been placed for a Hydroxychloroquine level,  you will need to have it drawn 4 hours or more after your last dose.  If you wish to have your labs drawn at another location, please call the office 24 hours in advance so we can fax the orders.  The office is located at [address], [address], [place], [place] [address]   If you have any questions regarding directions or hours of operation,  please call [phone].   As a reminder, please drink plenty of water prior to coming for your lab work. Thanks!   Vaccines You are taking a medication(s) that can suppress your immune system.  The following immunizations are recommended: Flu annually Covid-19  Td/Tdap (tetanus,  diphtheria, pertussis) every 10 years Pneumonia (Prevnar 15 then Pneumovax 23 at least 1 year apart.  Alternatively, can take Prevnar 20 without needing additional dose) Shingrix: 2 doses from 4 weeks to 6 months apart  Please check with your PCP to make sure you are up to date.  If you have signs or symptoms of an infection or start antibiotics: First, call your PCP for workup of your infection. Hold your medication through the infection, until you complete your antibiotics, and until symptoms resolve if you take the following: Injectable medication (Actemra, Benlysta, Cimzia, Cosentyx, Enbrel, Humira, Kevzara, Orencia, Remicade, Simponi, Stelara, Taltz, Tremfya) Methotrexate Leflunomide (Arava) Mycophenolate (Cellcept) Harriette Ohara, Olumiant, or Rinvoq   Heart Disease Prevention   Your inflammatory disease increases your risk of heart disease which includes heart attack, stroke, atrial fibrillation (irregular heartbeats), high blood pressure, heart failure and atherosclerosis (plaque in the arteries).  It is important to reduce your risk by:   Keep blood pressure, cholesterol, and blood sugar at healthy levels   Smoking Cessation   Maintain a healthy weight  BMI 20-25   Eat a healthy diet  Plenty of fresh fruit, vegetables, and whole grains  Limit saturated fats, foods high in sodium, and added sugars  DASH and Mediterranean diet   Increase physical activity  Recommend moderate physically activity for 150 minutes per week/ 30 minutes a day for five days a week These can be broken up into three separate ten-minute sessions  during the day.   Reduce Stress  Meditation, slow breathing exercises, yoga, coloring books  Dental visits twice a year

## 2023-05-16 ENCOUNTER — Ambulatory Visit: Payer: Commercial Managed Care - PPO | Admitting: Rheumatology

## 2023-06-09 ENCOUNTER — Other Ambulatory Visit: Payer: Self-pay | Admitting: *Deleted

## 2023-06-09 DIAGNOSIS — Z79899 Other long term (current) drug therapy: Secondary | ICD-10-CM

## 2023-06-09 DIAGNOSIS — Z111 Encounter for screening for respiratory tuberculosis: Secondary | ICD-10-CM

## 2023-06-09 DIAGNOSIS — Z9225 Personal history of immunosupression therapy: Secondary | ICD-10-CM

## 2023-06-10 LAB — CBC WITH DIFFERENTIAL/PLATELET
Absolute Lymphocytes: 1885 {cells}/uL (ref 850–3900)
Absolute Monocytes: 579 {cells}/uL (ref 200–950)
Basophils Absolute: 52 {cells}/uL (ref 0–200)
Basophils Relative: 0.8 %
Eosinophils Absolute: 299 {cells}/uL (ref 15–500)
Eosinophils Relative: 4.6 %
HCT: 39.6 % (ref 35.0–45.0)
Hemoglobin: 12.9 g/dL (ref 11.7–15.5)
MCH: 30.2 pg (ref 27.0–33.0)
MCHC: 32.6 g/dL (ref 32.0–36.0)
MCV: 92.7 fL (ref 80.0–100.0)
MPV: 11.3 fL (ref 7.5–12.5)
Monocytes Relative: 8.9 %
Neutro Abs: 3686 {cells}/uL (ref 1500–7800)
Neutrophils Relative %: 56.7 %
Platelets: 258 10*3/uL (ref 140–400)
RBC: 4.27 10*6/uL (ref 3.80–5.10)
RDW: 13.4 % (ref 11.0–15.0)
Total Lymphocyte: 29 %
WBC: 6.5 10*3/uL (ref 3.8–10.8)

## 2023-06-10 LAB — COMPREHENSIVE METABOLIC PANEL WITH GFR
AG Ratio: 1.8 (calc) (ref 1.0–2.5)
ALT: 17 U/L (ref 6–29)
AST: 18 U/L (ref 10–35)
Albumin: 4.3 g/dL (ref 3.6–5.1)
Alkaline phosphatase (APISO): 78 U/L (ref 37–153)
BUN: 16 mg/dL (ref 7–25)
CO2: 26 mmol/L (ref 20–32)
Calcium: 9 mg/dL (ref 8.6–10.4)
Chloride: 104 mmol/L (ref 98–110)
Creat: 0.73 mg/dL (ref 0.50–1.03)
Globulin: 2.4 g/dL (ref 1.9–3.7)
Glucose, Bld: 99 mg/dL (ref 65–99)
Potassium: 4.5 mmol/L (ref 3.5–5.3)
Sodium: 140 mmol/L (ref 135–146)
Total Bilirubin: 0.5 mg/dL (ref 0.2–1.2)
Total Protein: 6.7 g/dL (ref 6.1–8.1)
eGFR: 95 mL/min/{1.73_m2} (ref >=60)

## 2023-06-10 NOTE — Progress Notes (Signed)
 CBC and CMP normal

## 2023-06-17 ENCOUNTER — Encounter: Payer: Commercial Managed Care - PPO | Admitting: Family Medicine

## 2023-06-30 ENCOUNTER — Ambulatory Visit

## 2023-06-30 VITALS — BP 115/70 | HR 60 | Temp 97.6°F | Ht 64.0 in | Wt 159.2 lb

## 2023-06-30 DIAGNOSIS — E559 Vitamin D deficiency, unspecified: Secondary | ICD-10-CM | POA: Diagnosis not present

## 2023-06-30 DIAGNOSIS — E538 Deficiency of other specified B group vitamins: Secondary | ICD-10-CM | POA: Diagnosis not present

## 2023-06-30 DIAGNOSIS — R7303 Prediabetes: Secondary | ICD-10-CM | POA: Diagnosis not present

## 2023-06-30 DIAGNOSIS — K219 Gastro-esophageal reflux disease without esophagitis: Secondary | ICD-10-CM

## 2023-06-30 DIAGNOSIS — E039 Hypothyroidism, unspecified: Secondary | ICD-10-CM

## 2023-06-30 DIAGNOSIS — E785 Hyperlipidemia, unspecified: Secondary | ICD-10-CM

## 2023-06-30 DIAGNOSIS — Z789 Other specified health status: Secondary | ICD-10-CM | POA: Insufficient documentation

## 2023-06-30 DIAGNOSIS — E782 Mixed hyperlipidemia: Secondary | ICD-10-CM

## 2023-06-30 DIAGNOSIS — G43009 Migraine without aura, not intractable, without status migrainosus: Secondary | ICD-10-CM

## 2023-06-30 DIAGNOSIS — Z1231 Encounter for screening mammogram for malignant neoplasm of breast: Secondary | ICD-10-CM

## 2023-06-30 LAB — LIPID PANEL
Cholesterol: 281 mg/dL — ABNORMAL HIGH (ref 0–200)
HDL: 68.3 mg/dL (ref >39.00)
LDL Cholesterol: 192 mg/dL — ABNORMAL HIGH (ref 0–99)
NonHDL: 213.03
Total CHOL/HDL Ratio: 4
Triglycerides: 107 mg/dL (ref 0.0–149.0)
VLDL: 21.4 mg/dL (ref 0.0–40.0)

## 2023-06-30 LAB — VITAMIN B12: Vitamin B-12: 642 pg/mL (ref 211–911)

## 2023-06-30 LAB — HEMOGLOBIN A1C: Hgb A1c MFr Bld: 6.3 % (ref 4.6–6.5)

## 2023-06-30 LAB — VITAMIN D 25 HYDROXY (VIT D DEFICIENCY, FRACTURES): VITD: 36.38 ng/mL (ref 30.00–100.00)

## 2023-06-30 MED ORDER — ESOMEPRAZOLE MAGNESIUM 20 MG PO CPDR: 20.0000 mg | DELAYED_RELEASE_CAPSULE | Freq: Every day | ORAL | 1 refills | Status: DC

## 2023-06-30 NOTE — Assessment & Plan Note (Signed)
 Chronic, <5 episodes per year. Stable on Excedrin prn.

## 2023-06-30 NOTE — Assessment & Plan Note (Signed)
 Defer to documentation to hyperlipidemia from today's visit.

## 2023-06-30 NOTE — Assessment & Plan Note (Signed)
 Chronic. Last A1c: 6.3% on 12/15/22.  Check A1c today. Continue lifestyle modifications including incorporating mediterranean diet, moderate intensity exercise for 30 min for 5 days a week of 150 min total in 1 week.

## 2023-06-30 NOTE — Assessment & Plan Note (Signed)
 Chronic issue.   Last LDL: 203, 12/15/22. Repeat lipid panel today. Has tried statin in the past and developed s/e including worsening migraine headaches.  Was prescribed Repatha , did not start it due to cost.  Discussed increased r/o ASCVD including heart attack, stroke with uncontrolled hyperlipidemia.  After counseling pharmacy referral made to help assist with starting patient on Repatha /Evolocumab  140 mg, sub q every 2 weeks.

## 2023-06-30 NOTE — Assessment & Plan Note (Signed)
 Normal CBC on 06/09/2023 B12 low on 03/27/2020.  Check B12 level today, continue OTC 2000 mcg B12.

## 2023-06-30 NOTE — Assessment & Plan Note (Signed)
 Check vitamin D . Continue vitamin D  2000 international units once daily.

## 2023-06-30 NOTE — Assessment & Plan Note (Signed)
 Reviewed note from 02/11/2022 with GI Dr. Antony Baumgartner.  EGD 2018: Dx of eosinophilic esophagitis with small hiatal hernia. Symptoms stable on Nexium  20 mg daily. Continue, refill sent. Counseled patient f/u with GI once a year. She reports since symptoms has been stable on current treatment she will hold off on seeing gastroenterologist.  If new symptoms arises will refer her to GI.  Also counseled patient on potential medication s/e including vitamin B12 deficiency.

## 2023-06-30 NOTE — Assessment & Plan Note (Signed)
-   Sees endocrinologist Dr. Kathyanne Parkers. Has f/u with him in September of this year. Refill through him. - On Thyroid  NP 45 mg once a day.  - Symptoms stable.  - Last TSH normal on 12/15/2022.

## 2023-06-30 NOTE — Assessment & Plan Note (Signed)
 Due for screening mammogram in July, screening mammogram ordered.

## 2023-06-30 NOTE — Progress Notes (Addendum)
 Established patient of Dr. Lovetta Rucks, presenting for [facility] visit.  Subjective  Patient ID: Erica [name], female    DOB: September 16, 1965  Age: 58 y.o. MRN: [id number]  Chief Complaint  Patient presents with   Hypothyroidism   Hyperlipidemia    She  has a past medical history of Allergic rhinitis, Breast cancer (HCC), Chickenpox, GERD (gastroesophageal reflux disease), Headache, Hemorrhoids, History of blood transfusion, Hypercholesteremia, and Hyperthyroidism.  HPI Last visit with Dr. Lovetta Rucks on 12/15/2022.   1)Prediabetes:  Last A1c: 6.3% on 12/15/22. Was 6.7% in 11/24/21. She is trying Mediterranean diet, 4-5 days of walking for 30 minutes.  2) Vitamin D  insuffiencey:  Low Vitamin D  level on 11/24/20 at 29.16. Last vitamin D  on 12/15/22 was normal Taking vitamin D  2,000 units daily  3) Hypothyroidism: - Diagnosed around 4 years ago. No h/o thyroid  surgery.  - Sees endocrinologist Dr. Kathyanne Parkers. She is on Thyroid  NP 30 mg and Thyroid  NP 15 mg making it 45 mg once a day. Refill through Dr. Kathyanne Parkers. No s/e from this medication. She has tried Levothyroxine  in the past and developed leg crams. Next appointment with him is in 11/02/2023. - Last TSH from 12/15/2022 was normal at 4.41.  4) GERD - Eosinophilic esophagitis, last appointment with him 02/11/2022.  - Taking Nexium /Esmoprazole: 20 mg daily, from GI Dr. Lenton Rail. Dr. Lenton Rail is leaving [name]. Patient's symptoms stable on Nexium  so she does not want to establish care with new gastroenterologist at this time.  5) Meloxicam  15 mg, as needed for psoriatic arthritis of heels. Has psoriatic arthritis.   6) Hyperlipidemia with statin intolerance: - LDL: 203, last lipid 12/15/2022. - Has tried statin in the past, which made migraine headaches worse. - Has tried Rosuvastatin  in 11/26/2021. Has also tried Pravastatin  in the past but d/c due to side effects. - She was also prescribed Repatha  in the past by Dr. Lovetta Rucks but she never started  this due to cost related to the medication.   7) B 12 deficiency:  Normal CBC on 06/09/2023 Last B12: 03/27/2020 was low.  Has tried IM in the past. Currently on OTC B 12 supplement.   8) Normal CMP on 06/10/2023  9) Psoriasis, with psoriatic arthritis. - On Taltz , sees Jacinta Martinis, PA-C. F/U with them every 6 months.  10) Migraine headache:  - U/L headache, since she was [age] years of age. 3-4 times a year, Excedrin, rest helps in resolving headache.   11) Seasonal allergy : Symptoms stable on nasal Flonase  as needed.   ROS As per HPI    Objective:     BP 115/70   Pulse 60   Temp 97.6 F (36.4 C) (Oral)   Ht 5\' 4"  (1.626 m)   Wt 159 lb 3.2 oz (72.2 kg)   LMP 09/17/2014   SpO2 97%   BMI 27.33 kg/m      06/30/2023   10:03 AM 01/27/2023    9:12 AM 12/15/2022    2:59 PM  Depression screen PHQ 2/9  Decreased Interest 0 0 0  Down, Depressed, Hopeless 0 0 0  PHQ - 2 Score 0 0 0  Altered sleeping 0 0 0  Tired, decreased energy 0 0 0  Change in appetite 0 0 0  Feeling bad or failure about yourself  0 0 0  Trouble concentrating 0 0 0  Moving slowly or fidgety/restless 0 0 0  Suicidal thoughts 0 0 0  PHQ-9 Score 0 0 0  Difficult doing work/chores Not difficult at  all Not difficult at all Not difficult at all      06/30/2023   10:03 AM 01/27/2023    9:12 AM 12/15/2022    2:59 PM 06/15/2022    9:20 AM  GAD 7 : Generalized Anxiety Score  Nervous, Anxious, on Edge 0 0 0 0  Control/stop worrying 0 0 0 0  Worry too much - different things 0 0 0 0  Trouble relaxing 0 0 0 0  Restless 0 0 0 0  Easily annoyed or irritable 0 0 0 0  Afraid - awful might happen 0 0 0 0  Total GAD 7 Score 0 0 0 0  Anxiety Difficulty Not difficult at all Not difficult at all Not difficult at all Not difficult at all    Physical Exam Constitutional:      Appearance: Normal appearance.  HENT:     Head: Normocephalic and atraumatic.     Right Ear: Tympanic membrane normal.     Left Ear:  Tympanic membrane normal.     Ears:     Comments: Wearing hearing aids    Mouth/Throat:     Mouth: Mucous membranes are moist.  Eyes:     Conjunctiva/sclera: Conjunctivae normal.  Neck:     Thyroid : No thyroid  mass or thyroid  tenderness.  Cardiovascular:     Rate and Rhythm: Normal rate and regular rhythm.  Pulmonary:     Effort: Pulmonary effort is normal.     Breath sounds: Normal breath sounds.  Abdominal:     General: Bowel sounds are normal.     Palpations: Abdomen is soft.  Musculoskeletal:     Cervical back: Neck supple. No rigidity.     Right lower leg: No edema.     Left lower leg: No edema.  Skin:    General: Skin is warm.  Neurological:     Mental Status: She is alert and oriented to person, place, and time.  Psychiatric:        Mood and Affect: Mood normal.        Behavior: Behavior normal.        No results found for any visits on 06/30/23.  The 10-year ASCVD risk score (Arnett DK, et al., 2019) is: 3.1%    Assessment & Plan:  Mixed hyperlipidemia Assessment & Plan: Chronic issue.   Last LDL: 203, 12/15/22. Repeat lipid panel today. Has tried statin in the past and developed s/e including worsening migraine headaches.  Was prescribed Repatha , did not start it due to cost.  Discussed increased r/o ASCVD including heart attack, stroke with uncontrolled hyperlipidemia.  After counseling pharmacy referral made to help assist with starting patient on Repatha /Evolocumab  140 mg, sub q every 2 weeks.   Orders: -     Lipid panel -     AMB Referral [facility] Care Management  Hypothyroidism, unspecified type Assessment & Plan: - Sees endocrinologist Dr. Kathyanne Parkers. Has f/u with him in September of this year. Refill through him. - On Thyroid  NP 45 mg once a day.  - Symptoms stable.  - Last TSH normal on 12/15/2022.     Prediabetes Assessment & Plan: Chronic. Last A1c: 6.3% on 12/15/22.  Check A1c today. Continue lifestyle modifications including incorporating  mediterranean diet, moderate intensity exercise for 30 min for 5 days a week of 150 min total in 1 week.  Orders: -     Hemoglobin A1c  Vitamin D  deficiency Assessment & Plan: Check vitamin D . Continue vitamin D  2000 international units once daily.  Orders: -     VITAMIN D  25 Hydroxy (Vit-D Deficiency, Fractures)  Vitamin B 12 deficiency Assessment & Plan: Normal CBC on 06/09/2023 B12 low on 03/27/2020.  Check B12 level today, continue OTC 2000 mcg B12.   Orders: -     Vitamin B12  Screening mammogram for breast cancer Assessment & Plan: Due for screening mammogram in July, screening mammogram ordered.   Orders: -     3D Screening Mammogram, Left and Right; Future  Statin intolerance Assessment & Plan: Defer to documentation to hyperlipidemia from today's visit.   Gastroesophageal reflux disease, unspecified whether esophagitis present Assessment & Plan: Reviewed note from 02/11/2022 with GI Dr. Antony Baumgartner.  EGD 2018: Dx of eosinophilic esophagitis with small hiatal hernia. Symptoms stable on Nexium  20 mg daily. Continue, refill sent. Counseled patient f/u with GI once a year. She reports since symptoms has been stable on current treatment she will hold off on seeing gastroenterologist.  If new symptoms arises will refer her to GI.  Also counseled patient on potential medication s/e including vitamin B12 deficiency.      Migraine without aura and without status migrainosus, not intractable Assessment & Plan: Chronic, <5 episodes per year. Stable on Excedrin prn.    Other orders -     Esomeprazole  Magnesium ; Take 1 capsule (20 mg total) by mouth daily at 12 noon.  Dispense: 90 capsule; Refill: 1    Return in about 6 months (around 12/31/2023) for Chronic follow up .  I spent 60 minutes on the day of this face to face encounter reviewing patient's prior relevant medical, surgical, non-surgical history, labs, specialist notes including endocrine, GI, current medications,   imaging studies, referral to pharmacy, reviewing the assessment and plan with patient and husband (present during today's visit) in detail and post visit ordering and reviewing of  diagnostics and therapeutics with patient .   Jacklin Mascot, MD

## 2023-07-01 ENCOUNTER — Ambulatory Visit: Payer: Self-pay | Admitting: Internal Medicine

## 2023-07-04 ENCOUNTER — Telehealth: Payer: Self-pay

## 2023-07-04 NOTE — Progress Notes (Signed)
 Care Guide Pharmacy Note  07/04/2023 Name: Erica Woods MRN: [id number] DOB: 1965/04/26  Referred By: Jacklin Mascot, MD Reason for referral: Complex Care Management (Outreach to schedule with Pharm d )   Erica Woods is a 58 y.o. year old female who is a primary care patient of Bair, Kalpana, MD.  Geoffery Kiel was referred to the pharmacist for assistance related to: HLD  Successful contact was made with the patient to discuss pharmacy services including being ready for the pharmacist to call at least 5 minutes before the scheduled appointment time and to have medication bottles and any blood pressure readings ready for review. The patient agreed to meet with the pharmacist via telephone visit on (date/time).07/08/2023  Lenton Rail , RMA     [place]  [facility], [facility] Guide  Direct Dial: [phone]  Website: [email or web address]

## 2023-07-08 ENCOUNTER — Other Ambulatory Visit (INDEPENDENT_AMBULATORY_CARE_PROVIDER_SITE_OTHER): Admitting: Pharmacist

## 2023-07-08 DIAGNOSIS — E7800 Pure hypercholesterolemia, unspecified: Secondary | ICD-10-CM

## 2023-07-08 MED ORDER — REPATHA SURECLICK 140 MG/ML ~~LOC~~ SOAJ: 140.0000 mg | SUBCUTANEOUS | 5 refills | Status: AC

## 2023-07-08 NOTE — Progress Notes (Signed)
 Agree with above plan as documented.   Jacklin Mascot, MD

## 2023-07-08 NOTE — Progress Notes (Signed)
 07/08/2023 Name: Erica Woods MRN: [id number] DOB: 01/06/1966  Subjective  Chief Complaint  Patient presents with   Hyperlipidemia    Reason for visit: ?  Erica Woods is a 58 y.o. female who presents today for an initial visit for hyperlipidemia management/medication access.? Pertinent PMH includes HLD, Pre-diabetes, hypothyroidism.  Care Team: Primary Care Provider: Bair, Kalpana, MD  Medication Access/Adherence: Prescription drug coverage: Payor: [place] [employer] / Plan: UMR/UHC PPO / Product Type: *No Product type* / .  Reports that all medications are affordable currently, though historically brand medication including Repatha  have been cost prohibitive. (In previous years, Repatha  copay was ~$80). Preferred copay = $5-10.  Current Patient Assistance: None Engineer, drilling)   History of Present Illness: ?  Reports multiple trials of cholesterol medications in the past resulting in intolerable migraine headaches. Has migraine at baseline, though soon after starting rosuvastatin  and pravastatin  on separate occasions, migraine onset occurred soon after, resolved after discontinuation of the medication both times.   Reported Lipid Regimen: ?  N/A   Lipid-lowering medications tried in the past:?  Rosuvastatin  (severe headache) Pravastatin  (worsening of migraines)   Cardiovascular Risk Reduction History of clinical ASCVD? no The 10-year ASCVD risk score (Arnett DK, et al., 2019) is: 2.7% History of heart failure? no  History of diabetes: Pre-diabetes, x1 A1c >6.5% Current BMI: 27.3 kg/m2 (Ht 64 in, Wt 69.9 kg) Taking statin? intolerant (rosuvastatin , prvastatin) Taking aspirin? not indicated; Not taking   Taking SGLT-2i? no Taking GLP- 1 RA? no     _______________________________________________  Objective     Vitals:  Wt Readings from Last 3 Encounters:  06/30/23 159 lb 3.2 oz (72.2 kg)  05/09/23 154 lb (69.9 kg)  01/27/23 155 lb 12.8 oz (70.7 kg)   BP  Readings from Last 3 Encounters:  06/30/23 115/70  05/09/23 123/74  01/27/23 130/72   Pulse Readings from Last 3 Encounters:  06/30/23 60  05/09/23 (!) 53  01/27/23 73     Labs:?   Lab Results  Component Value Date   CKTOTAL 46 03/19/2020    Lab Results  Component Value Date   CHOL 281 (H) 06/30/2023   LDLCALC 192 (H) 06/30/2023   LDLCALC 203 (H) 12/15/2022   LDLCALC 197 (H) 11/24/2021   LDLDIRECT 140.0 07/27/2022   LDLDIRECT 183.0 06/14/2022   HDL 68.30 06/30/2023   TRIG 107.0 06/30/2023   TRIG 117.0 12/15/2022   TRIG 179.0 (H) 11/24/2021   ALT 17 06/09/2023   ALT 18 03/04/2023   AST 18 06/09/2023   AST 17 03/04/2023   Lab Results  Component Value Date   HGBA1C 6.3 06/30/2023   HGBA1C 6.3 12/15/2022   HGBA1C 6.3 03/08/2022   GLUCOSE 99 06/09/2023   CREATININE 0.73 06/09/2023   CREATININE 0.72 03/04/2023   CREATININE 0.75 12/08/2022   GFR 94.91 11/24/2020   GFR 98.02 03/27/2020   GFR 80.08 10/12/2019     Chemistry      Component Value Date/Time   NA 140 06/09/2023 0923   NA 141 11/22/2016 1228   K 4.5 06/09/2023 0923   K 4.2 11/22/2016 1228   CL 104 06/09/2023 0923   CO2 26 06/09/2023 0923   CO2 28 11/22/2016 1228   BUN 16 06/09/2023 0923   BUN 9.1 11/22/2016 1228   CREATININE 0.73 06/09/2023 0923   CREATININE 0.8 11/22/2016 1228      Component Value Date/Time   CALCIUM  9.0 06/09/2023 0923   CALCIUM  9.4 11/22/2016 1228   ALKPHOS 71  07/27/2022 0847   ALKPHOS 69 11/22/2016 1228   AST 18 06/09/2023 0923   AST 19 11/29/2019 0843   AST 23 11/22/2016 1228   ALT 17 06/09/2023 0923   ALT 19 11/29/2019 0843   ALT 27 11/22/2016 1228   BILITOT 0.5 06/09/2023 0923   BILITOT 0.3 11/29/2019 0843   BILITOT 0.42 11/22/2016 1228      The 10-year ASCVD risk score (Arnett DK, et al., 2019) is: 2.7%  Assessment and Plan:     Hyperlipidemia (Primary prevention of ASCVD):  Uncontrolled on last lipid panel (06/30/23): TC 281 mg/dL, LDL 161 mg/dL, TG 096  mg/dL. LDL goal <100 mg/dL (primary prevention, no diabetes, low risk).  - Current Regimen: N/A - Previous therapies: rosuvastatin , pravastatin  (intolerance), Repatha  (cost prohibitive) - Key risk factors include: Pre-diabetes - The 10-year ASCVD risk score (Arnett DK, et al., 2019) is: 2.7%       Considerations: Ezetimibe: Reasonable to consider per benefits demonstrated in the IMPROVE-IT trial (though this is in addition to statin). Patient apprehensive of side effects, though incidence of headache as possible side effects was 0-1% in clinical trials. With this, she reports she is open to trying ezetimibe.  PCSK9i: Ideal per greater LDL-lowering propensity as well as 15% reduction in MACE outcomes per FOURIER/ODYSSEY trials (though similarly, this is in addition to statin therapy).  Bempedoic acid (Nexletol):  [facility]): Reasonable, though likely more challenging to get covered.        PLAN Start Repatha  140 mg sq q14 days Consider repeat lipid panel 4-12 weeks / with next routine labs Test claim = $24.99/monthRepatha  Savings Card = $15/month RxBin: [id number] RxPCN: [id number] RxGrp: [id number] ID: [id number]   Follow Up Patient given direct line for questions regarding medication therapy  Future Appointments  Date Time Provider Department Center  10/10/2023  8:30 AM Romayne Clubs, PA-C [facility] None  01/02/2024  1:00 PM Bair, Randa Burton, MD [facility]    Daron Ellen, PharmD Clinical Pharmacist [facility] [phone]

## 2023-07-08 NOTE — Patient Instructions (Signed)
 Ms. SAIDE LANUZA,   It was a pleasure to speak with you today! As we discussed:?   It looks like your insurance is willing to pay for Repatha  based on our test claims.   Repatha  is a twice-monthly injection that helps to reduce "bad" cholesterol, known as LDL. With this, Repatha  has shown benefits including significantly reducing the risk of events such as heart attack and stroke.   Most patients tolerate repatha  well without side effects.  It looks like your insurance should cover Repatha  for $24.99 per month.  The Repatha  Saving's Card should reduce your copay to $15 per month so that it is more affordable.       Medication & Administration    Dosage: Inject the contents of 1 pen (140mg ) under the skin every 2 weeks.   Administration: Administer under the skin of the abdomen, thigh or upper arm. Rotate sites with each injection. Injection instructions Remove 1 Repatha  autoinjector from the refrigerator and let stand at room temperature for at least 30 minutes. Check the autoinjector for the following: Expiration date Absence of any cracks or damage The medicine is clear and colorless and does not contain any particles The orange cap is present and securely attached Choose your injection site and clean with an alcohol wipe. Allow to air dry completely. Pull the orange cap straight off and discard Pinch the skin (or stretch) with your thumb and fingers creating an area 2 inches wide Maintaining the pinch (or stretch) press the pen to your skin at a 90 degree angle. Firmly push the autoinjector down until the skin stops moving and the yellow safety guard is no longer visible. Do not touch the gray start button yet When you are ready to inject, press the gray start button. You will hear a "click" that signals the start of the injection Continue to press the pen to your skin and lift your thumb The injection may take up to 15 seconds. You will know the injection is complete when the  medication window turns yellow. You may also hear a second "click." Remove the pen from your skin and discard the pen in a sharps container. If there is blood at the injection site, press a cotton ball or gauze to the site. Do not rub the injection site. Adherence/Missed dose instructions: Administer a missed dose within 7 days and resume your normal schedule.  If it has been more than 7 days and you inject every 2 weeks, skip the missed dose and resume your normal schedule..       Storage, Handling Precautions, & Disposal  Repatha  should be stored in the refrigerator. If necessary, Repatha  may be kept at room temperature for no more than 30 days. Place used devices in a sharps container for disposal.   Please reach out prior to your next scheduled appointment should you have any questions or concerns.  Thank you!   Future Appointments  Date Time Provider Department Center  10/10/2023  8:30 AM Romayne Clubs, PA-C [facility] None  01/02/2024  1:00 PM Bair, Randa Burton, MD [facility]    Daron Ellen, PharmD Clinical Pharmacist [facility] [phone]

## 2023-07-12 ENCOUNTER — Other Ambulatory Visit: Payer: Self-pay | Admitting: Physician Assistant

## 2023-07-12 ENCOUNTER — Other Ambulatory Visit: Payer: Self-pay

## 2023-07-12 DIAGNOSIS — Z79899 Other long term (current) drug therapy: Secondary | ICD-10-CM

## 2023-07-12 DIAGNOSIS — L4050 Arthropathic psoriasis, unspecified: Secondary | ICD-10-CM

## 2023-07-12 DIAGNOSIS — L409 Psoriasis, unspecified: Secondary | ICD-10-CM

## 2023-07-12 NOTE — Telephone Encounter (Signed)
 Last Fill: 04/25/2023  Labs: 06/09/2023 CBC and CMP normal  TB Gold: 07/12/2023 TB Gold negative    Next Visit: 10/10/2023  Last Visit: 05/09/2023  DX: Psoriatic arthritis   Current Dose per office note 05/09/2023: Taltz  80 mg subcutaneous injections every 4 weeks   Okay to refill Taltz ?

## 2023-09-01 ENCOUNTER — Other Ambulatory Visit: Payer: Self-pay | Admitting: *Deleted

## 2023-09-01 DIAGNOSIS — Z79899 Other long term (current) drug therapy: Secondary | ICD-10-CM

## 2023-09-01 LAB — COMPREHENSIVE METABOLIC PANEL WITH GFR
AG Ratio: 1.9 (calc) (ref 1.0–2.5)
ALT: 16 U/L (ref 6–29)
AST: 19 U/L (ref 10–35)
Albumin: 4.3 g/dL (ref 3.6–5.1)
Alkaline phosphatase (APISO): 78 U/L (ref 37–153)
BUN: 11 mg/dL (ref 7–25)
CO2: 29 mmol/L (ref 20–32)
Calcium: 9.3 mg/dL (ref 8.6–10.4)
Chloride: 102 mmol/L (ref 98–110)
Creat: 0.76 mg/dL (ref 0.50–1.03)
Globulin: 2.3 g/dL (ref 1.9–3.7)
Glucose, Bld: 100 mg/dL — ABNORMAL HIGH (ref 65–99)
Potassium: 4.3 mmol/L (ref 3.5–5.3)
Sodium: 140 mmol/L (ref 135–146)
Total Bilirubin: 0.5 mg/dL (ref 0.2–1.2)
Total Protein: 6.6 g/dL (ref 6.1–8.1)
eGFR: 90 mL/min/1.73m2 (ref >=60)

## 2023-09-01 LAB — CBC WITH DIFFERENTIAL/PLATELET
Absolute Lymphocytes: 1830 {cells}/uL (ref 850–3900)
Absolute Monocytes: 570 {cells}/uL (ref 200–950)
Basophils Absolute: 48 {cells}/uL (ref 0–200)
Basophils Relative: 0.8 %
Eosinophils Absolute: 198 {cells}/uL (ref 15–500)
Eosinophils Relative: 3.3 %
HCT: 42.4 % (ref 35.0–45.0)
Hemoglobin: 13.8 g/dL (ref 11.7–15.5)
MCH: 30.7 pg (ref 27.0–33.0)
MCHC: 32.5 g/dL (ref 32.0–36.0)
MCV: 94.4 fL (ref 80.0–100.0)
MPV: 11.4 fL (ref 7.5–12.5)
Monocytes Relative: 9.5 %
Neutro Abs: 3354 {cells}/uL (ref 1500–7800)
Neutrophils Relative %: 55.9 %
Platelets: 225 Thousand/uL (ref 140–400)
RBC: 4.49 Million/uL (ref 3.80–5.10)
RDW: 13.3 % (ref 11.0–15.0)
Total Lymphocyte: 30.5 %
WBC: 6 Thousand/uL (ref 3.8–10.8)

## 2023-09-02 ENCOUNTER — Ambulatory Visit: Payer: Self-pay | Admitting: Rheumatology

## 2023-09-02 NOTE — Progress Notes (Signed)
 CBC and CMP are normal.

## 2023-09-05 ENCOUNTER — Other Ambulatory Visit: Payer: Self-pay

## 2023-09-05 DIAGNOSIS — Z1231 Encounter for screening mammogram for malignant neoplasm of breast: Secondary | ICD-10-CM

## 2023-09-12 ENCOUNTER — Ambulatory Visit
Admission: RE | Admit: 2023-09-12 | Discharge: 2023-09-12 | Disposition: A | Discharge status: Home / Self Care | Source: Ambulatory Visit

## 2023-09-26 NOTE — Progress Notes (Signed)
 Office Visit Note  Patient: Erica Woods             Date of Birth: 06-24-1965           MRN: [id number]             PCP: Bair, Kalpana, MD Referring: No ref. provider found Visit Date: 10/10/2023 Occupation: @GUAROCC @  Subjective:  Medication monitoring   History of Present Illness: Erica Woods is a 58 y.o. female with history of psoriatic arthritis. Patient remains on  Taltz  80 mg subcutaneous injections every 4 weeks.  She is tolerating Taltz  without any side effects and has not had any recent gaps in therapy.  Patient states that Taltz  does not seem to be making a difference with her psoriasis on her hands and elbows at this time.  Patient states that she has been using moisturizers and topical steroids as needed.  She has not seen dermatology recently.  She experiences intermittent pain and stiffness involving both hands.  She denies any joint swelling currently.  She denies any Achilles tendinitis or plantar fasciitis.  She denies any SI joint pain.  Her discomfort due to trochanteric bursitis has improved since performing daily stretching exercises.  She has had less difficulty climbing steps.  She takes meloxicam  50 mg 1 tablet daily as needed for pain relief. She denies any recent or recurrent infections.   Activities of Daily Living:  Patient reports morning stiffness for a few minutes.   Patient Denies nocturnal pain.  Difficulty dressing/grooming: Denies Difficulty climbing stairs: Denies Difficulty getting out of chair: Denies Difficulty using hands for taps, buttons, cutlery, and/or writing: Denies  Review of Systems  Constitutional:  Negative for fatigue.  HENT:  Positive for mouth dryness. Negative for mouth sores.   Eyes:  Negative for dryness.  Respiratory:  Negative for shortness of breath.   Cardiovascular:  Negative for chest pain and palpitations.  Gastrointestinal:  Negative for blood in stool, constipation and diarrhea.  Endocrine: Negative for increased  urination.  Genitourinary:  Negative for involuntary urination.  Musculoskeletal:  Positive for morning stiffness. Negative for joint pain, gait problem, joint pain, joint swelling, myalgias, muscle weakness, muscle tenderness and myalgias.  Skin:  Negative for color change, rash, hair loss and sensitivity to sunlight.  Allergic/Immunologic: Negative for susceptible to infections.  Neurological:  Negative for dizziness and headaches.  Hematological:  Negative for swollen glands.  Psychiatric/Behavioral:  Negative for depressed mood and sleep disturbance. The patient is not nervous/anxious.     PMFS History:  Patient Active Problem List   Diagnosis Date Noted   Statin intolerance 06/30/2023   Vitamin B 12 deficiency 06/30/2023   Bronchitis 01/27/2023   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   Vitamin D  deficiency 05/25/2021   Postmenopausal bleeding 11/24/2020   Rash 03/26/2020   Prediabetes 09/20/2018   Stress 11/14/2017   Hot flashes 07/29/2017   Hyperlipidemia 07/29/2017   Hypothyroidism 11/08/2016   Muscle cramps 11/08/2016   Plantar fasciitis, left 07/05/2016   Psoriasis 07/05/2016   Arthralgia of both hands 05/04/2016   History of breast cancer 11/19/2015   Screening mammogram for breast cancer 05/02/2015   Migraines 11/25/2014   GERD (gastroesophageal reflux disease) 11/25/2014   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 11/22/2014    Past Medical History:  Diagnosis Date   Allergic rhinitis    Breast cancer (HCC)    Chickenpox    GERD (gastroesophageal reflux disease)    Headache    Hemorrhoids  History of blood transfusion    Hypercholesteremia    Hyperthyroidism     Family History  Problem Relation Age of Onset   Diabetes Mother    Osteoporosis Mother    Heart attack Father    Diabetes Sister    Cancer Sister    Parkinson's disease Sister    Tuberculosis Brother    Irregular heart beat Brother    Healthy Son    Irregular heart beat Son     Healthy Son    Hypertension Other    Diabetes Other    Past Surgical History:  Procedure Laterality Date   ABDOMINAL SURGERY     APPENDECTOMY     BALLOON DILATION N/A 02/04/2017   Procedure: MERRILL DILATION;  Surgeon: Therisa Bi, MD;  Location: [facility] ENDOSCOPY;  Service: Gastroenterology;  Laterality: N/A;   BREAST BIOPSY Left 10/02/2014   Procedure: LEFT BREAST BIOPSY AFTER NEEDLE LOCALIZATION X TWO;  Surgeon: Oneil Budge, MD;  Location: [facility] ORS;  Service: General;  Laterality: Left;   BREAST LUMPECTOMY Left 2016   benign   COLONOSCOPY N/A 10/23/2014   Procedure: COLONOSCOPY;  Surgeon: Claudis RAYMOND Rivet, MD;  Location: [facility] ENDO SUITE;  Service: Endoscopy;  Laterality: N/A;  730   ESOPHAGOGASTRODUODENOSCOPY (EGD) WITH PROPOFOL  N/A 02/04/2017   Procedure: ESOPHAGOGASTRODUODENOSCOPY (EGD) WITH PROPOFOL ;  Surgeon: Therisa Bi, MD;  Location: [facility] ENDOSCOPY;  Service: Gastroenterology;  Laterality: N/A;   ESOPHAGOGASTRODUODENOSCOPY (EGD) WITH PROPOFOL  N/A 09/17/2021   Procedure: ESOPHAGOGASTRODUODENOSCOPY (EGD) WITH PROPOFOL ;  Surgeon: Therisa Bi, MD;  Location: [facility] ENDOSCOPY;  Service: Endoscopy;  Laterality: N/A;   LAPAROSCOPIC APPENDECTOMY  03/02/2012   Procedure: APPENDECTOMY LAPAROSCOPIC;  Surgeon: Thresa JAYSON Pulling, MD;  Location: [facility] ORS;  Service: General;  Laterality: N/A;   OVARIAN CYST REMOVAL Left    ROOT CANAL  04/07/2022   TUBAL LIGATION     WISDOM TOOTH EXTRACTION Bilateral    Social History   Social History Narrative   Not on file   Immunization History  Administered Date(s) Administered   Influenza,inj,Quad PF,6+ Mos 11/14/2017, 11/24/2020, 12/10/2021   Influenza-Unspecified 11/17/2015, 12/12/2018, 12/03/2019   Moderna Sars-Covid-2 Vaccination 06/05/2019, 07/03/2019, 01/18/2020, 07/04/2020   PNEUMOCOCCAL CONJUGATE-20 11/24/2020   Tdap 05/02/2015   Zoster Recombinant(Shingrix ) 05/09/2017, 07/12/2017     Objective: Vital Signs: BP 131/80 (BP Location: Left Arm,  Patient Position: Sitting, Cuff Size: Normal)   Pulse (!) 58   Resp 12   Ht 5' 4 (1.626 m)   Wt 162 lb (73.5 kg)   LMP 09/17/2014   BMI 27.81 kg/m    Physical Exam Vitals and nursing note reviewed.  Constitutional:      Appearance: She is well-developed.  HENT:     Head: Normocephalic and atraumatic.  Eyes:     Conjunctiva/sclera: Conjunctivae normal.  Cardiovascular:     Rate and Rhythm: Normal rate and regular rhythm.     Heart sounds: Normal heart sounds.  Pulmonary:     Effort: Pulmonary effort is normal.     Breath sounds: Normal breath sounds.  Abdominal:     General: Bowel sounds are normal.     Palpations: Abdomen is soft.  Musculoskeletal:     Cervical back: Normal range of motion.  Lymphadenopathy:     Cervical: No cervical adenopathy.  Skin:    General: Skin is warm and dry.     Capillary Refill: Capillary refill takes less than 2 seconds.     Comments: Hypopigmentation noted on the volar aspect of both wrists.  Raised  erythematous patches noted to finger and thumb bilaterally.  Patches of psoriasis noted on the extensor surface of both elbows  Neurological:     Mental Status: She is alert and oriented to person, place, and time.  Psychiatric:        Behavior: Behavior normal.      Musculoskeletal Exam: C-spine C-spine has good range of motion.  Thoracic kyphosis noted.  No midline spinal tenderness.  No SI joint tenderness upon palpation.  Shoulder joints, elbow joints, wrist joints, MCPs, PIPs, DIPs have good range of motion with no synovitis.  Hip joints have good range of motion with no groin pain.  Knee joints have good range of motion no warmth or effusion.  No tenderness over the trochanteric bursa noted.  Knee joints have good range of motion no warmth or effusion.  Ankle joints have good range of motion with no tenderness or joint swelling.  No evidence of achilles tendonitis.    CDAI Exam: CDAI Score: -- Patient Global: --; Provider Global:  -- Swollen: --; Tender: -- Joint Exam 10/10/2023   No joint exam has been documented for this visit   There is currently no information documented on the homunculus. Go to the Rheumatology activity and complete the homunculus joint exam.  Investigation: No additional findings.  Imaging: MM 3D SCREENING MAMMOGRAM BILATERAL BREAST Result Date: 09/14/2023 CLINICAL DATA:  Screening. EXAM: DIGITAL SCREENING BILATERAL MAMMOGRAM WITH TOMOSYNTHESIS AND CAD TECHNIQUE: Bilateral screening digital craniocaudal and mediolateral oblique mammograms were obtained. Bilateral screening digital breast tomosynthesis was performed. The images were evaluated with computer-aided detection. COMPARISON:  Previous exam(s). ACR Breast Density Category b: There are scattered areas of fibroglandular density. FINDINGS: There are no findings suspicious for malignancy. IMPRESSION: No mammographic evidence of malignancy. A result letter of this screening mammogram will be mailed directly to the patient. RECOMMENDATION: Screening mammogram in one year. (Code:SM-B-01Y) BI-RADS CATEGORY  1: Negative. Electronically Signed   By: Rosina Gelineau M.D.   On: 09/14/2023 12:58    Recent Labs: Lab Results  Component Value Date   WBC 6.0 09/01/2023   HGB 13.8 09/01/2023   PLT 225 09/01/2023   NA 140 09/01/2023   K 4.3 09/01/2023   CL 102 09/01/2023   CO2 29 09/01/2023   GLUCOSE 100 (H) 09/01/2023   BUN 11 09/01/2023   CREATININE 0.76 09/01/2023   BILITOT 0.5 09/01/2023   ALKPHOS 71 07/27/2022   AST 19 09/01/2023   ALT 16 09/01/2023   PROT 6.6 09/01/2023   ALBUMIN 4.3 07/27/2022   CALCIUM  9.3 09/01/2023   GFRAA 115 06/16/2020   QFTBGOLDPLUS NEGATIVE 03/04/2023    Speciality Comments: MTX tabs 04/14/20-07/26/20 (stopped d/t nausea from folic acid ); switched to Tremfya  08/04/21 Taltz  02/24/21  Procedures:  No procedures performed Allergies: Molnupiravir  and Gold-containing drug products    Assessment / Plan:      Visit Diagnoses: Psoriatic arthritis (HCC) - She has no synovitis or dactylitis on examination today.  No signs of Achilles tendinitis, plantar fasciitis, or uveitis at this time.  Overall her symptoms remain stable on Taltz  80 mg sq injections once every 4 weeks.  She is tolerating Taltz  without any side effects and has not had any gaps in therapy.  No recent or recurrent infections.  Her discomfort due to trochanteric bursitis and SI joint pain have improved with daily stretching exercises.   No medication changes will be made at this time.  She was advised to notify us  if she develops any new or worsening  symptoms.  She will follow-up in the office in 5 months or sooner if needed.  Plan: ixekizumab  (TALTZ ) 80 MG/ML pen  Psoriasis -She has patches of psoriasis on the extensor surface of both elbows.  Hypopigmentation noted on the volar aspect of both wrists with some raised erythematous patches and cracking noted on the index and thumb of both hands.  Patient was encouraged to follow back up with dermatology for further evaluation.  She does not feel that Taltz  is effective at managing her symptoms of psoriasis. Patient has been trying to use more moisturizers and topical agents but continues to have significant peeling and cracking of the skin.  Plan: ixekizumab  (TALTZ ) 80 MG/ML pen  High risk medication use - Taltz  80 mg subcutaneous injections every 4 weeks.  Previous therapy: MTX and tremfya . CBC and CMP updated on 09/01/23. Her next lab work will be due in October and every 3 months.  TB gold negative on 03/04/23.   No recent or recurrent infections.  Discussed the importance of holding taltz  if she develops signs or symptoms of an infection and to resume once the infection has completely cleared.    - Plan: ixekizumab  (TALTZ ) 80 MG/ML pen  Primary osteoarthritis of both hands: She experiences intermittent pain and stiffness in both hands but no synovitis or dactylitis noted.   Chronic right SI  joint pain: No SI joint tenderness upon palpation.   Plantar fasciitis, bilateral - Under care of Dr. Janit. Not currently symptomatic.   Trochanteric bursitis of both hips: Improved.  She has been performing daily stretching exercises which helped to alleviate her symptoms.  She has no tenderness upon palpation at this time.  Primary osteoarthritis of both feet: She has good ROM of both ankle joints with no tenderness or joint swelling.  No evidence of Achilles tendinitis.  Arthropathy of lumbar facet joint: No midline spinal tenderness.  No symptoms of radiculopathy.   Osteopenia of multiple sites - 02/11/2022 DEXA scan T-score -1.8, BMD 0.795 left femoral neck.  Use of calcium  rich diet and vitamin D  was advised.  Need for regular exercise was emphasized. DEXA due December 2025.   Vitamin D  deficiency: She is taking vitamin D  daily.   Other medical conditions are listed as follows:   Lobular carcinoma in situ (LCIS) of left breast  Prediabetes  History of hyperlipidemia  History of gastroesophageal reflux (GERD)  History of hypothyroidism  Hx of migraines    Orders: No orders of the defined types were placed in this encounter.  Meds ordered this encounter  Medications   ixekizumab  (TALTZ ) 80 MG/ML pen    Sig: INJECT 1 PEN SUBCUTANEOUSLY  EVERY 4 WEEKS    Dispense:  3 mL    Refill:  0    Prescription Type::   Renewal    Follow-Up Instructions: Return in about 5 months (around 03/11/2024) for Psoriatic arthritis.   Waddell CHRISTELLA Craze, PA-C  Note - This record has been created using Dragon software.  Chart creation errors have been sought, but may not always  have been located. Such creation errors do not reflect on  the standard of medical care.

## 2023-10-07 ENCOUNTER — Telehealth: Payer: Self-pay | Admitting: Pharmacist

## 2023-10-07 NOTE — Telephone Encounter (Signed)
 Patient due for Taltz  PA renewal. Will be submitted to [employer] via [employer] portal after OV on 10/10/2023  Susano Cleckler, PharmD, MPH, BCPS, CPP Clinical Pharmacist (Rheumatology and Pulmonology)

## 2023-10-09 ENCOUNTER — Other Ambulatory Visit: Payer: Self-pay | Admitting: Physician Assistant

## 2023-10-09 DIAGNOSIS — L4050 Arthropathic psoriasis, unspecified: Secondary | ICD-10-CM

## 2023-10-09 DIAGNOSIS — Z79899 Other long term (current) drug therapy: Secondary | ICD-10-CM

## 2023-10-09 DIAGNOSIS — L409 Psoriasis, unspecified: Secondary | ICD-10-CM

## 2023-10-10 ENCOUNTER — Ambulatory Visit: Attending: Physician Assistant | Admitting: Physician Assistant

## 2023-10-10 ENCOUNTER — Encounter: Payer: Self-pay | Admitting: Physician Assistant

## 2023-10-10 VITALS — BP 131/80 | HR 58 | Resp 12 | Ht 64.0 in | Wt 162.0 lb

## 2023-10-10 DIAGNOSIS — L409 Psoriasis, unspecified: Secondary | ICD-10-CM

## 2023-10-10 DIAGNOSIS — R7303 Prediabetes: Secondary | ICD-10-CM

## 2023-10-10 DIAGNOSIS — M722 Plantar fascial fibromatosis: Secondary | ICD-10-CM

## 2023-10-10 DIAGNOSIS — M19071 Primary osteoarthritis, right ankle and foot: Secondary | ICD-10-CM

## 2023-10-10 DIAGNOSIS — M19041 Primary osteoarthritis, right hand: Secondary | ICD-10-CM | POA: Diagnosis not present

## 2023-10-10 DIAGNOSIS — M533 Sacrococcygeal disorders, not elsewhere classified: Secondary | ICD-10-CM

## 2023-10-10 DIAGNOSIS — M8589 Other specified disorders of bone density and structure, multiple sites: Secondary | ICD-10-CM

## 2023-10-10 DIAGNOSIS — M19072 Primary osteoarthritis, left ankle and foot: Secondary | ICD-10-CM

## 2023-10-10 DIAGNOSIS — D0502 Lobular carcinoma in situ of left breast: Secondary | ICD-10-CM

## 2023-10-10 DIAGNOSIS — Z79899 Other long term (current) drug therapy: Secondary | ICD-10-CM | POA: Diagnosis not present

## 2023-10-10 DIAGNOSIS — G8929 Other chronic pain: Secondary | ICD-10-CM

## 2023-10-10 DIAGNOSIS — Z8719 Personal history of other diseases of the digestive system: Secondary | ICD-10-CM

## 2023-10-10 DIAGNOSIS — M7061 Trochanteric bursitis, right hip: Secondary | ICD-10-CM

## 2023-10-10 DIAGNOSIS — Z8639 Personal history of other endocrine, nutritional and metabolic disease: Secondary | ICD-10-CM

## 2023-10-10 DIAGNOSIS — E559 Vitamin D deficiency, unspecified: Secondary | ICD-10-CM

## 2023-10-10 DIAGNOSIS — Z8669 Personal history of other diseases of the nervous system and sense organs: Secondary | ICD-10-CM

## 2023-10-10 DIAGNOSIS — L4050 Arthropathic psoriasis, unspecified: Secondary | ICD-10-CM

## 2023-10-10 DIAGNOSIS — M19042 Primary osteoarthritis, left hand: Secondary | ICD-10-CM

## 2023-10-10 DIAGNOSIS — M7062 Trochanteric bursitis, left hip: Secondary | ICD-10-CM

## 2023-10-10 DIAGNOSIS — M47816 Spondylosis without myelopathy or radiculopathy, lumbar region: Secondary | ICD-10-CM

## 2023-10-10 MED ORDER — TALTZ 80 MG/ML ~~LOC~~ SOAJ: SUBCUTANEOUS | 0 refills | Status: DC

## 2023-10-10 NOTE — Telephone Encounter (Signed)
 Initiated a PA renewal request via [employer] portal. Waiting for questions to populate.

## 2023-10-10 NOTE — Patient Instructions (Signed)
 Standing Labs We placed an order today for your standing lab work.   Please have your standing labs drawn in mid-October and every 3 months   Please have your labs drawn 2 weeks prior to your appointment so that the provider can discuss your lab results at your appointment, if possible.  Please note that you may see your imaging and lab results in MyChart before we have reviewed them. We will contact you once all results are reviewed. Please allow our office up to 72 hours to thoroughly review all of the results before contacting the office for clarification of your results.  WALK-IN LAB HOURS  Monday through Thursday from 8:00 am -12:30 pm and 1:00 pm-4:30 pm and Friday from 8:00 am-12:00 pm.  Patients with office visits requiring labs will be seen before walk-in labs.  You may encounter longer than normal wait times. Please allow additional time. Wait times may be shorter on  Monday and Thursday afternoons.  We do not book appointments for walk-in labs. We appreciate your patience and understanding with our staff.   Labs are drawn by [facility]. Please bring your co-pay at the time of your lab draw.  You may receive a bill from [facility] for your lab work.  Please note if you are on Hydroxychloroquine and and an order has been placed for a Hydroxychloroquine level,  you will need to have it drawn 4 hours or more after your last dose.  If you wish to have your labs drawn at another location, please call the office 24 hours in advance so we can fax the orders.  The office is located at [address], [place], [place] [address]   If you have any questions regarding directions or hours of operation,  please call [phone].   As a reminder, please drink plenty of water prior to coming for your lab work. Thanks!

## 2023-10-11 NOTE — Telephone Encounter (Signed)
 Received form from [employer]. They need documentation that patient is doing well on treatment. D/w Waddell Craze, PA-C. PsA doing well but PsO is still active and patient has been referred to dermatology for consultation for possible atopic dermatitis differential  Sherry Pennant, PharmD, MPH, BCPS, CPP Clinical Pharmacist (Rheumatology and Pulmonology)

## 2023-10-11 NOTE — Telephone Encounter (Signed)
 Questions finally populated, completed and submitted along with supporting chart notes. This is, of course, after we received the fax from [employer] this morning, however we will wait for any further correspondence regarding this authorization.  Case# [id number]

## 2023-10-18 ENCOUNTER — Ambulatory Visit

## 2023-10-18 DIAGNOSIS — L409 Psoriasis, unspecified: Secondary | ICD-10-CM | POA: Diagnosis not present

## 2023-10-18 DIAGNOSIS — L299 Pruritus, unspecified: Secondary | ICD-10-CM | POA: Diagnosis not present

## 2023-10-18 DIAGNOSIS — L309 Dermatitis, unspecified: Secondary | ICD-10-CM

## 2023-10-18 DIAGNOSIS — L4050 Arthropathic psoriasis, unspecified: Secondary | ICD-10-CM

## 2023-10-18 DIAGNOSIS — Z7189 Other specified counseling: Secondary | ICD-10-CM | POA: Diagnosis not present

## 2023-10-18 MED ORDER — VTAMA 1 % EX CREA: TOPICAL_CREAM | CUTANEOUS | 5 refills | Status: AC

## 2023-10-18 MED ORDER — CLOBETASOL PROPIONATE 0.05 % EX OINT: TOPICAL_OINTMENT | CUTANEOUS | 5 refills | Status: AC

## 2023-10-18 NOTE — Progress Notes (Signed)
   New Patient Visit   Subjective  Erica Woods is a 58 y.o. female who presents for the following: Psoriasis. Hands, elbows. Has been on Taltz  for approximately 2 years for psoriatic arthritis. Helps with joint pain but not psoriasis on skin. Rheumatologist recommended follow up with dermatology to R/O eczema since rash has not cleared. Using Triamcinolone  cream, does not work anymore. Has used other topical steroid creams in the past. Washes hands a lot at work. Works at assisted living facility. Uses Aveeno moisturizer. Does not use soap provided by her job. Uses Dove soap.   The following portions of the chart were reviewed this encounter and updated as appropriate: medications, allergies, medical history  Review of Systems:  No other skin or systemic complaints except as noted in HPI or Assessment and Plan.  Objective  Well appearing patient in no apparent distress; mood and affect are within normal limits.  Areas Examined: Hands, elbows  - Well circumscribed erythematous papules and plaques with overlying silvery scale on flexural surfaces including elbow (L) - fissured, scaly lichenified plaques of R wrist, bilateral hands   Relevant exam findings are noted in the Assessment and Plan.   Assessment & Plan     Pruritic, fissured, scaly plaques of bilateral hands, R wrist - favor hand dermatitis vs less likely psoriasis  Chronic and persistent condition with duration or expected duration over one year. Condition is symptomatic and bothersome to patient. Patient is flaring and not currently at treatment goal.  - Diagnosis, treatment options, prognosis, risk/ benefit, and side effects of treatment were discussed with the patient.  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 Condition is bothersome/symptomatic for patient. Currently flared. Patient C/O joint pain. Followed by rheumatology.   Treatment Plan: Clobetasol  0.05% ointment BID until clear  Start Vtama  1% cream apply daily to affected areas for maintenance  Discussed could consider alternate biologic in future if not clear on taltz    Counseling on psoriasis and coordination of care  psoriasis is a chronic non-curable, but treatable genetic/hereditary disease that may have other systemic features affecting other organ systems such as joints (Psoriatic Arthritis). It is associated with an increased risk of inflammatory bowel disease, heart disease, non-alcoholic fatty liver disease, and depression.  Treatments include light and laser treatments; topical medications; and systemic medications including oral and injectables.    Return for Psoriasis Follow Up in 6-8 weeks.  I, Jill Parcell, CMA, am acting as scribe for Lauraine JAYSON Kanaris, MD.   Documentation: I have reviewed the above documentation for accuracy and completeness, and I agree with the above.  Lauraine JAYSON Kanaris, MD

## 2023-10-18 NOTE — Patient Instructions (Addendum)
 Start Clobetasol  ointment Apply 1 gram topically to affected area of skin twice daily. Stop once resolved and restart as needed for flares. Avoid use on face, armpits, groin unless otherwise indicated.  Cover with Vaseline Jelly and cotton gloves at bedtime.   Recommend mild soap and moisturizing cream with hand washing.     Start Vtama  cream apply daily to affected areas.     Due to recent changes in healthcare laws, you may see results of your pathology and/or laboratory studies on MyChart before the doctors have had a chance to review them. We understand that in some cases there may be results that are confusing or concerning to you. Please understand that not all results are received at the same time and often the doctors may need to interpret multiple results in order to provide you with the best plan of care or course of treatment. Therefore, we ask that you please give us  2 business days to thoroughly review all your results before contacting the office for clarification. Should we see a critical lab result, you will be contacted sooner.   If You Need Anything After Your Visit  If you have any questions or concerns for your doctor, please call our main line at [phone] and press option 4 to reach your doctor's medical assistant. If no one answers, please leave a voicemail as directed and we will return your call as soon as possible. Messages left after 4 pm will be answered the following business day.   You may also send us  a message via MyChart. We typically respond to MyChart messages within 1-2 business days.  For prescription refills, please ask your pharmacy to contact our office. Our fax number is [phone].  If you have an urgent issue when the clinic is closed that cannot wait until the next business day, you can page your doctor at the number below.    Please note that while we do our best to be available for urgent issues outside of office hours, we are not available  24/7.   If you have an urgent issue and are unable to reach us , you may choose to seek medical care at your doctor's office, retail clinic, urgent care center, or emergency room.  If you have a medical emergency, please immediately call 911 or go to the emergency department.  Pager Numbers  - Dr. Hester: [phone]  - Dr. Jackquline: [phone]  - Dr. Claudene: [phone]   - Dr. Raymund: [phone]  In the event of inclement weather, please call our main line at [phone] for an update on the status of any delays or closures.  Dermatology Medication Tips: Please keep the boxes that topical medications come in in order to help keep track of the instructions about where and how to use these. Pharmacies typically print the medication instructions only on the boxes and not directly on the medication tubes.   If your medication is too expensive, please contact our office at [phone] option 4 or send us  a message through MyChart.   We are unable to tell what your co-pay for medications will be in advance as this is different depending on your insurance coverage. However, we may be able to find a substitute medication at lower cost or fill out paperwork to get insurance to cover a needed medication.   If a prior authorization is required to get your medication covered by your insurance company, please allow us  1-2 business days to complete this process.  Drug prices often vary  depending on where the prescription is filled and some pharmacies may offer cheaper prices.  The website www.goodrx.com contains coupons for medications through different pharmacies. The prices here do not account for what the cost may be with help from insurance (it may be cheaper with your insurance), but the website can give you the price if you did not use any insurance.  - You can print the associated coupon and take it with your prescription to the pharmacy.  - You may also stop by our office during  regular business hours and pick up a GoodRx coupon card.  - If you need your prescription sent electronically to a different pharmacy, notify our office through [facility] or by phone at [phone] option 4.     Si Usted Necesita Algo Despus de Su Visita  Tambin puede enviarnos un mensaje a travs de Clinical cytogeneticist. Por lo general respondemos a los mensajes de MyChart en el transcurso de 1 a 2 das hbiles.  Para renovar recetas, por favor pida a su farmacia que se ponga en contacto con nuestra oficina. Randi lakes de fax es [place] [phone].  Si tiene un asunto urgente cuando la clnica est cerrada y que no puede esperar hasta el siguiente da hbil, puede llamar/localizar a su doctor(a) al nmero que aparece a continuacin.   Por favor, tenga en cuenta que aunque hacemos todo lo posible para estar disponibles para asuntos urgentes fuera del horario de [place], no estamos disponibles las 24 horas del da, los 7 [address]  [address] de la [place].   Si tiene un problema urgente y no puede comunicarse con nosotros, puede optar por buscar atencin mdica  en el consultorio de su doctor(a), en una clnica privada, en un centro de atencin urgente o en una sala de emergencias.  Si tiene Engineer, drilling, por favor llame inmediatamente al 911 o vaya a la sala de emergencias.  Nmeros de bper  - Dr. Hester: [phone]  - Dra. Jackquline: [phone]  - Dr. Claudene: [phone]  - Dra. Kitts: [phone]  En caso de inclemencias del [place], por favor llame a nuestra lnea principal al [phone] para una actualizacin sobre el estado de cualquier retraso o cierre.  Consejos para la medicacin en dermatologa: Por favor, guarde las cajas en las que vienen los medicamentos de uso tpico para ayudarle a seguir las instrucciones sobre dnde y cmo usarlos. Las farmacias generalmente imprimen las instrucciones del medicamento slo en las cajas y no directamente en los tubos del [place].    Si su medicamento es muy caro, por favor, pngase en contacto con landry rieger llamando al [phone] y presione la opcin 4 o envenos un mensaje a travs de Clinical cytogeneticist.   No podemos decirle cul ser su copago por los medicamentos por adelantado ya que esto es diferente dependiendo de la cobertura de su seguro. Sin embargo, es posible que podamos encontrar un medicamento sustituto a Audiological scientist un formulario para que el seguro cubra el medicamento que se considera necesario.   Si se requiere una autorizacin previa para que su compaa de seguros malta su medicamento, por favor permtanos de 1 a 2 das hbiles para completar este proceso.  Los precios de los medicamentos varan con frecuencia dependiendo del Environmental consultant de dnde se surte la receta y alguna farmacias pueden ofrecer precios ms baratos.  El sitio web www.goodrx.com tiene cupones para medicamentos de Health and safety inspector. Los precios aqu no tienen en cuenta lo que podra costar con la ayuda del seguro (puede ser  ms barato con su seguro), pero el sitio web puede darle el precio si no Visual merchandiser.  - Puede imprimir el cupn correspondiente y llevarlo con su receta a la farmacia.  - Tambin puede pasar por nuestra oficina durante el horario de atencin regular y Education officer, museum una tarjeta de cupones de GoodRx.  - Si necesita que su receta se enve electrnicamente a una farmacia diferente, informe a nuestra oficina a travs de MyChart de [place] o por telfono llamando al [phone] y presione la opcin 4.

## 2023-10-21 NOTE — Telephone Encounter (Signed)
 Received another fax from [employer] regarding clinicals needed showing patient is doing well on treatment. Last two OV notes sent  EOC: [id number] Email: [email or web address]

## 2023-10-26 NOTE — Telephone Encounter (Signed)
 Received notification from [facility] regarding a prior authorization for TALTZ . Authorization has been APPROVED from 10/25/2023 to 10/23/2024. Approval letter sent to scan center.  EOC # [id number]  Sherry Pennant, PharmD, MPH, BCPS, CPP Clinical Pharmacist [facility])

## 2023-11-28 ENCOUNTER — Ambulatory Visit

## 2023-11-28 DIAGNOSIS — L309 Dermatitis, unspecified: Secondary | ICD-10-CM

## 2023-11-28 DIAGNOSIS — L409 Psoriasis, unspecified: Secondary | ICD-10-CM

## 2023-11-28 DIAGNOSIS — L408 Other psoriasis: Secondary | ICD-10-CM | POA: Diagnosis not present

## 2023-11-28 NOTE — Patient Instructions (Signed)

## 2023-11-28 NOTE — Progress Notes (Signed)
    Subjective   Erica Woods is a 58 y.o. female who presents for the following: Follow up of Hand dermatitis, less likely psoriasis. Patient is established patient   Today patient reports: Hands are improving with clobetasol  ointment twice daily as needed, uses Vaseline at night.  She never received tapinarof  1% cream. Patient is also on Taltz  for Psoriasis with PsA, using clobetasol  ointment as needed to the elbows.   Review of Systems:    No other skin or systemic complaints except as noted in HPI or Assessment and Plan.  The following portions of the chart were reviewed this encounter and updated as appropriate: medications, allergies, medical history  Relevant Medical History:  n/a   Objective  Well appearing patient in no apparent distress; mood and affect are within normal limits. Examination was performed of the: Focused Exam of: hands   Examination notable for: RASHES - erythematous scaly plaques on dorsal hands; fissured plaques of R index, L thumb and L middle  Examination limited by: Clothing     Assessment & Plan     Hand dermatitis vs less likely psoriasis - mild Chronic and persistent condition with duration or expected duration over one year. Condition is symptomatic and bothersome to patient. Patient is flaring and not currently at treatment goal.  - improving from prior visit  - Diagnosis, treatment options, prognosis, risk/ benefit, and side effects of treatment were discussed with the patient.  -  Continue clobetasol  ointment BID to raised itchy areas until areas are smooth, discussed risks/benefits of meds - Start Anzupgo Cream once daily to affected areas hands. Sample given Lot [id number] Exp 08/2026. Patient may call for Rx.  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 Condition is symptomatic and bothersome to patient. Patient is flaring and not currently at treatment goal.  - Educated, discussed chronic nature and waxing/waning. - Discussed association with psoriatic arthritis, monitor for increasing joint pain/stiffness, red/hot swollen fingers or joints; discussed if develops joint involvement will need referral to Rheumatology and possible escalation of therapy - Clobetasol  ointment 0.05% twice daily for 2 weeks to thick plaques. Can similarly retreat for flares. - Clobetasol  0.05% solution daily for scalp involvement  - Discussed risks including possibility of steroid atrophy with long-term usage - Continue taltz  - managed by Rheum    Procedures, orders, diagnosis for this visit:    There are no diagnoses linked to this encounter.  Return to clinic: Return in about 4 months (around 03/30/2024) for Hand Dermatitis.  Documentation: I have reviewed the above documentation for accuracy and completeness, and I agree with the above.  Lauraine JAYSON Kanaris, MD

## 2023-12-06 ENCOUNTER — Other Ambulatory Visit: Payer: Self-pay | Admitting: *Deleted

## 2023-12-06 DIAGNOSIS — Z79899 Other long term (current) drug therapy: Secondary | ICD-10-CM

## 2023-12-06 LAB — CBC WITH DIFFERENTIAL/PLATELET
Absolute Lymphocytes: 2149 {cells}/uL (ref 850–3900)
Absolute Monocytes: 553 {cells}/uL (ref 200–950)
Basophils Absolute: 63 {cells}/uL (ref 0–200)
Basophils Relative: 0.9 %
Eosinophils Absolute: 168 {cells}/uL (ref 15–500)
Eosinophils Relative: 2.4 %
HCT: 43.1 % (ref 35.0–45.0)
Hemoglobin: 14.2 g/dL (ref 11.7–15.5)
MCH: 30.5 pg (ref 27.0–33.0)
MCHC: 32.9 g/dL (ref 32.0–36.0)
MCV: 92.7 fL (ref 80.0–100.0)
MPV: 11.3 fL (ref 7.5–12.5)
Monocytes Relative: 7.9 %
Neutro Abs: 4067 {cells}/uL (ref 1500–7800)
Neutrophils Relative %: 58.1 %
Platelets: 254 Thousand/uL (ref 140–400)
RBC: 4.65 Million/uL (ref 3.80–5.10)
RDW: 13 % (ref 11.0–15.0)
Total Lymphocyte: 30.7 %
WBC: 7 Thousand/uL (ref 3.8–10.8)

## 2023-12-06 LAB — COMPREHENSIVE METABOLIC PANEL WITH GFR
AG Ratio: 1.7 (calc) (ref 1.0–2.5)
ALT: 14 U/L (ref 6–29)
AST: 13 U/L (ref 10–35)
Albumin: 4.4 g/dL (ref 3.6–5.1)
Alkaline phosphatase (APISO): 77 U/L (ref 37–153)
BUN: 18 mg/dL (ref 7–25)
CO2: 28 mmol/L (ref 20–32)
Calcium: 9.4 mg/dL (ref 8.6–10.4)
Chloride: 104 mmol/L (ref 98–110)
Creat: 0.7 mg/dL (ref 0.50–1.03)
Globulin: 2.6 g/dL (ref 1.9–3.7)
Glucose, Bld: 102 mg/dL — ABNORMAL HIGH (ref 65–99)
Potassium: 4.1 mmol/L (ref 3.5–5.3)
Sodium: 141 mmol/L (ref 135–146)
Total Bilirubin: 0.3 mg/dL (ref 0.2–1.2)
Total Protein: 7 g/dL (ref 6.1–8.1)
eGFR: 100 mL/min/1.73m2 (ref >=60)

## 2023-12-07 ENCOUNTER — Ambulatory Visit: Payer: Self-pay | Admitting: Rheumatology

## 2023-12-14 ENCOUNTER — Telehealth: Payer: Self-pay

## 2023-12-14 NOTE — Telephone Encounter (Signed)
 Left VM and MyChart message for pt to call back and reschedule visit from 01/02/24.  E2C2, please reschedule pt if they call back -kh

## 2023-12-28 ENCOUNTER — Other Ambulatory Visit: Payer: Self-pay | Admitting: Physician Assistant

## 2023-12-28 DIAGNOSIS — L409 Psoriasis, unspecified: Secondary | ICD-10-CM

## 2023-12-28 DIAGNOSIS — L4050 Arthropathic psoriasis, unspecified: Secondary | ICD-10-CM

## 2023-12-28 DIAGNOSIS — Z79899 Other long term (current) drug therapy: Secondary | ICD-10-CM

## 2023-12-28 NOTE — Telephone Encounter (Signed)
 Last Fill: 10/10/2023  Labs: 12/06/2023 Glucose 102  TB Gold: 03/04/2023 Neg    Next Visit: 03/14/2024  Last Visit: 10/10/2023  IK:Ednmpjupr arthritis   Current Dose per office note 10/10/2023: Taltz  80 mg subcutaneous injections every 4 weeks.   Okay to refill Taltz ?

## 2024-01-02 ENCOUNTER — Ambulatory Visit

## 2024-01-13 ENCOUNTER — Other Ambulatory Visit: Payer: Self-pay

## 2024-02-17 ENCOUNTER — Ambulatory Visit: Payer: Self-pay

## 2024-02-17 NOTE — Telephone Encounter (Signed)
 FYI Only or Action Required?: FYI only for provider: Referred to UC.  Patient was last seen in primary care on 06/30/2023 by Abbey Bruckner, MD.  Called Nurse Triage reporting Sore Throat.  Symptoms began yesterday.  Interventions attempted: OTC medications: Ibuprofen , Coricidin .  Symptoms are: gradually worsening.  Triage Disposition: See Physician Within 24 Hours  Patient/caregiver understands and will follow disposition?: Yes              Copied from CRM #[id number]. Topic: Clinical - Red Word Triage >> Feb 17, 2024  9:41 AM Emylou G wrote: [facility] that prompted transfer to Nurse Triage: severe throat - feels like someone is scratching hard at it, fever ( 101 ), headache, sometimes light headed.. Reason for Disposition  SEVERE throat pain (e.g., excruciating)  Answer Assessment - Initial Assessment Questions 1. ONSET: When did the throat start hurting? (Hours or days ago)      Yesterday   2. SEVERITY: How bad is the sore throat? (Scale 1-10; mild, moderate or severe)     Severe   3. STREP EXPOSURE: Has there been any exposure to strep within the past week? If Yes, ask: What type of contact occurred?      No   4.  VIRAL SYMPTOMS: Are there any symptoms of a cold, such as a runny nose, cough, hoarse voice or red eyes?      Cough- green   5. FEVER: Do you have a fever? If Yes, ask: What is your temperature, how was it measured, and when did it start?     Low-grade fever   6. PUS ON THE TONSILS: Is there pus on the tonsils in the back of your throat?     No   7. OTHER SYMPTOMS: Do you have any other symptoms? (e.g., difficulty breathing, headache, rash)    No    Patient called in to triage with complaints of sore throat, fever, cough. This has been ongoing for one day  The patient stated its becoming harder to drink liquids due to the pain/irritation.  For home care, the patient is taking OTC Ibuprofen , Coricidin.   Appointment offered,  however the soonest avail. Appt. Per Epic is 1/5; patient would like to be seen today. Referred to UC for immediate evaluation. Patient agrees with the plan of care, and will reach out if symptoms worsen or persist.  Protocols used: Sore Throat-A-AH

## 2024-02-17 NOTE — Telephone Encounter (Signed)
 Lvm for pt to give office a call back. Need to confirm if pt went to urgent care for eval. Okay to relay

## 2024-02-29 NOTE — Progress Notes (Signed)
 "  Office Visit Note  Patient: Erica Woods             Date of Birth: 09/28/1965           MRN: [id number]             PCP: Abbey Bruckner, MD Referring: Abbey Bruckner, MD Visit Date: 03/14/2024 Occupation: Data Unavailable  Subjective:  Medication management  History of Present Illness: Erica Woods is a 59 y.o. female with psoriatic arthritis and psoriasis.  She returns today after her last visit in August 2025.  She states on January her first she developed influenza A infection and gradually recovered from that.  She states for the last 3 days she has been having nasal congestion.  She is holding her Taltz  injection.  She has not had any flares of psoriasis or psoriatic arthritis.  She denies episodes of plantar fasciitis or Achilles tendinitis.  She has been taking Taltz  80 mg subcu every 4 weeks without any interruption until recently.  She quit taking meloxicam .    Activities of Daily Living:  Patient reports morning stiffness for 5-10 minutes.   Patient Denies nocturnal pain.  Difficulty dressing/grooming: Denies Difficulty climbing stairs: Denies Difficulty getting out of chair: Denies Difficulty using hands for taps, buttons, cutlery, and/or writing: Denies  Review of Systems  Constitutional:  Negative for fatigue.  HENT:  Positive for mouth dryness. Negative for mouth sores.   Eyes:  Negative for dryness.  Respiratory:  Negative for shortness of breath.   Cardiovascular:  Negative for chest pain and palpitations.  Gastrointestinal:  Negative for blood in stool, constipation and diarrhea.  Endocrine: Negative for increased urination.  Genitourinary:  Negative for involuntary urination.  Musculoskeletal:  Positive for joint pain, joint pain, joint swelling, myalgias, morning stiffness and myalgias. Negative for gait problem, muscle weakness and muscle tenderness.  Skin:  Negative for color change, rash, hair loss and sensitivity to sunlight.  Allergic/Immunologic:  Positive for susceptible to infections.  Neurological:  Negative for dizziness and headaches.  Hematological:  Negative for swollen glands.  Psychiatric/Behavioral:  Negative for depressed mood and sleep disturbance. The patient is not nervous/anxious.     PMFS History:  Patient Active Problem List   Diagnosis Date Noted   Statin intolerance 06/30/2023   Vitamin B 12 deficiency 06/30/2023   Bronchitis 01/27/2023   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   Vitamin D  deficiency 05/25/2021   Postmenopausal bleeding 11/24/2020   Rash 03/26/2020   Prediabetes 09/20/2018   Stress 11/14/2017   Hot flashes 07/29/2017   Hyperlipidemia 07/29/2017   Hypothyroidism 11/08/2016   Muscle cramps 11/08/2016   Plantar fasciitis, left 07/05/2016   Psoriasis 07/05/2016   Arthralgia of both hands 05/04/2016   History of breast cancer 11/19/2015   Screening mammogram for breast cancer 05/02/2015   Migraines 11/25/2014   GERD (gastroesophageal reflux disease) 11/25/2014   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 11/22/2014    Past Medical History:  Diagnosis Date   Allergic rhinitis    Breast cancer (HCC)    Chickenpox    GERD (gastroesophageal reflux disease)    Headache    Hemorrhoids    History of blood transfusion    Hypercholesteremia    Hyperthyroidism     Family History  Problem Relation Age of Onset   Diabetes Mother    Osteoporosis Mother    Heart attack Father    Diabetes Sister    Cancer Sister    Parkinson's disease Sister  Tuberculosis Brother    Irregular heart beat Brother    Healthy Son    Irregular heart beat Son    Healthy Son    Hypertension Other    Diabetes Other    Past Surgical History:  Procedure Laterality Date   ABDOMINAL SURGERY     APPENDECTOMY     BALLOON DILATION N/A 02/04/2017   Procedure: MERRILL DILATION;  Surgeon: Therisa Bi, MD;  Location: [facility] ENDOSCOPY;  Service: Gastroenterology;  Laterality: N/A;   BREAST BIOPSY Left 10/02/2014    Procedure: LEFT BREAST BIOPSY AFTER NEEDLE LOCALIZATION X TWO;  Surgeon: Oneil Budge, MD;  Location: [facility] ORS;  Service: General;  Laterality: Left;   BREAST LUMPECTOMY Left 2016   benign   COLONOSCOPY N/A 10/23/2014   Procedure: COLONOSCOPY;  Surgeon: Claudis RAYMOND Rivet, MD;  Location: [facility] ENDO SUITE;  Service: Endoscopy;  Laterality: N/A;  730   ESOPHAGOGASTRODUODENOSCOPY (EGD) WITH PROPOFOL  N/A 02/04/2017   Procedure: ESOPHAGOGASTRODUODENOSCOPY (EGD) WITH PROPOFOL ;  Surgeon: Therisa Bi, MD;  Location: [facility] ENDOSCOPY;  Service: Gastroenterology;  Laterality: N/A;   ESOPHAGOGASTRODUODENOSCOPY (EGD) WITH PROPOFOL  N/A 09/17/2021   Procedure: ESOPHAGOGASTRODUODENOSCOPY (EGD) WITH PROPOFOL ;  Surgeon: Therisa Bi, MD;  Location: [facility] ENDOSCOPY;  Service: Endoscopy;  Laterality: N/A;   LAPAROSCOPIC APPENDECTOMY  03/02/2012   Procedure: APPENDECTOMY LAPAROSCOPIC;  Surgeon: Thresa JAYSON Pulling, MD;  Location: [facility] ORS;  Service: General;  Laterality: N/A;   OVARIAN CYST REMOVAL Left    ROOT CANAL  04/07/2022   TUBAL LIGATION     WISDOM TOOTH EXTRACTION Bilateral    Social History[1] Social History   Social History Narrative   Not on file     Immunization History  Administered Date(s) Administered   Influenza,inj,Quad PF,6+ Mos 11/14/2017, 11/24/2020, 12/10/2021   Influenza-Unspecified 11/17/2015, 12/12/2018, 12/03/2019   Moderna Sars-Covid-2 Vaccination 06/05/2019, 07/03/2019, 01/18/2020, 07/04/2020   PNEUMOCOCCAL CONJUGATE-20 11/24/2020   Tdap 05/02/2015   Zoster Recombinant(Shingrix ) 05/09/2017, 07/12/2017     Objective: Vital Signs: BP 133/81   Pulse 62   Temp 97.9 F (36.6 C)   Resp 14   Ht 5' 4 (1.626 m)   Wt 161 lb 3.2 oz (73.1 kg)   LMP 09/17/2014 Comment: Tubal Ligation  BMI 27.67 kg/m    Physical Exam Vitals and nursing note reviewed.  Constitutional:      Appearance: She is well-developed.  HENT:     Head: Normocephalic and atraumatic.  Eyes:     Conjunctiva/sclera:  Conjunctivae normal.  Cardiovascular:     Rate and Rhythm: Normal rate and regular rhythm.     Heart sounds: Normal heart sounds.  Pulmonary:     Effort: Pulmonary effort is normal.     Breath sounds: Normal breath sounds.  Abdominal:     General: Bowel sounds are normal.     Palpations: Abdomen is soft.  Musculoskeletal:     Cervical back: Normal range of motion.  Lymphadenopathy:     Cervical: No cervical adenopathy.  Skin:    General: Skin is warm and dry.     Capillary Refill: Capillary refill takes less than 2 seconds.     Comments: Psoriasis patches were noted on the volar aspect of her bilateral wrist.  Residual hypopigmentation was noted.  Neurological:     Mental Status: She is alert and oriented to person, place, and time.  Psychiatric:        Behavior: Behavior normal.      Musculoskeletal Exam: Cervical, thoracic and lumbar spine were in good range of motion.  There was no SI joint tenderness.  Shoulder joints, elbow joints, wrist joints, MCPs, PIPs and DIPs were in good range of motion with no synovitis.  Hip joints and knee joints were in good range of motion without any warmth swelling or effusion.  There was no tenderness over ankles or MTPs.   CDAI Exam: CDAI Score: -- Patient Global: --; Provider Global: -- Swollen: --; Tender: -- Joint Exam 03/14/2024   No joint exam has been documented for this visit   There is currently no information documented on the homunculus. Go to the Rheumatology activity and complete the homunculus joint exam.  Investigation: No additional findings.  Imaging: No results found.  Recent Labs: Lab Results  Component Value Date   WBC 7.0 12/06/2023   HGB 14.2 12/06/2023   PLT 254 12/06/2023   NA 141 12/06/2023   K 4.1 12/06/2023   CL 104 12/06/2023   CO2 28 12/06/2023   GLUCOSE 102 (H) 12/06/2023   BUN 18 12/06/2023   CREATININE 0.70 12/06/2023   BILITOT 0.3 12/06/2023   ALKPHOS 71 07/27/2022   AST 13 12/06/2023    ALT 14 12/06/2023   PROT 7.0 12/06/2023   ALBUMIN 4.3 07/27/2022   CALCIUM  9.4 12/06/2023   GFRAA 115 06/16/2020   QFTBGOLDPLUS NEGATIVE 03/04/2023    Speciality Comments: MTX tabs 04/14/20-07/26/20 (stopped d/t nausea from folic acid ); switched to Tremfya  08/04/21 Taltz  02/24/21  Procedures:  No procedures performed Allergies: Molnupiravir  and Gold-containing drug products   Assessment / Plan:     Visit Diagnoses: Psoriatic arthritis (HCC)-patient denies having a flare of psoriatic arthritis since the last visit.  She states she has been taking Taltz  on a regular basis.  She had fluid in the first week of January and now she has upper respiratory tract infection symptoms.  She is holding Taltz  now.  She denies having any joint swelling, plantar fasciitis or Achilles tendinitis.  Psoriasis - She saw Dr. Lauraine Graves.  She was given some topical agents.  She has been using petroleum jelly on her hands and also wearing gloves at night which has been helpful.  High risk medication use - Taltz  80 mg subcutaneous injections every 4 weeks.  Previous therapy: MTX and tremfya . -Labs from December 06, 2023 CBC and CMP were normal.  TB Gold was negative on March 04, 2023.  Will check labs today then every 3 months.  Annual TB Gold was advised.  Plan: CBC with Differential/Platelet, Comprehensive metabolic panel with GFR, QuantiFERON-TB Gold Plus.  Information regarding the immunization was placed in the AVS.  She was advised to hold Taltz  if she develops an infection resume after the infection resolves.  Primary osteoarthritis of both hands-she had PIP and DIP thickening with no synovitis.  Chronic right SI joint pain-improved after physical therapy palpation.  Plantar fasciitis, bilateral - Under care of Dr. Janit.  She denies any recent episodes.  Trochanteric bursitis of both hips-she had no tenderness over trochanteric region.  Primary osteoarthritis of both feet-she denies  discomfort.  Arthropathy of lumbar facet joint-she is intermittent pain in her lower back.  Osteopenia of multiple sites - 02/11/2022 DEXA scan T-score -1.8, BMD 0.795 left femoral neck.  She was advised to get repeat DEXA scan.  Vitamin D  deficiency-vitamin D  was normal on Jun 30, 2023.  She has been taking vitamin D .  Other medical problems are listed as follows:  Prediabetes-hemoglobin A1c was 6.3 on Jun 30, 2023.  Lobular carcinoma in situ (LCIS) of left breast  History of gastroesophageal reflux (GERD)  History of hyperlipidemia-LDL was 192 on Jun 30, 2023.  Dietary modifications and exercise was discussed.  Association of heart disease with psoriatic arthritis was discussed.  Hx of migraines  History of hypothyroidism  Orders: Orders Placed This Encounter  Procedures   CBC with Differential/Platelet   Comprehensive metabolic panel with GFR   QuantiFERON-TB Gold Plus   No orders of the defined types were placed in this encounter.    Follow-Up Instructions: Return in about 5 months (around 08/12/2024) for Psoriatic arthritis.   Maya Nash, MD  Note - This record has been created using Animal nutritionist.  Chart creation errors have been sought, but may not always  have been located. Such creation errors do not reflect on  the standard of medical care.     [1]  Social History Tobacco Use   Smoking status: Never    Passive exposure: Never   Smokeless tobacco: Never  Vaping Use   Vaping status: Never Used  Substance Use Topics   Alcohol use: Not Currently   Drug use: No   "

## 2024-03-14 ENCOUNTER — Ambulatory Visit: Attending: Rheumatology | Admitting: Rheumatology

## 2024-03-14 ENCOUNTER — Encounter: Payer: Self-pay | Admitting: Rheumatology

## 2024-03-14 VITALS — BP 133/81 | HR 62 | Temp 97.9°F | Resp 14 | Ht 64.0 in | Wt 161.2 lb

## 2024-03-14 DIAGNOSIS — M19041 Primary osteoarthritis, right hand: Secondary | ICD-10-CM | POA: Diagnosis not present

## 2024-03-14 DIAGNOSIS — L4050 Arthropathic psoriasis, unspecified: Secondary | ICD-10-CM

## 2024-03-14 DIAGNOSIS — M19071 Primary osteoarthritis, right ankle and foot: Secondary | ICD-10-CM | POA: Diagnosis not present

## 2024-03-14 DIAGNOSIS — D0502 Lobular carcinoma in situ of left breast: Secondary | ICD-10-CM

## 2024-03-14 DIAGNOSIS — M8589 Other specified disorders of bone density and structure, multiple sites: Secondary | ICD-10-CM

## 2024-03-14 DIAGNOSIS — M7061 Trochanteric bursitis, right hip: Secondary | ICD-10-CM | POA: Diagnosis not present

## 2024-03-14 DIAGNOSIS — M722 Plantar fascial fibromatosis: Secondary | ICD-10-CM

## 2024-03-14 DIAGNOSIS — M533 Sacrococcygeal disorders, not elsewhere classified: Secondary | ICD-10-CM | POA: Diagnosis not present

## 2024-03-14 DIAGNOSIS — E559 Vitamin D deficiency, unspecified: Secondary | ICD-10-CM | POA: Diagnosis not present

## 2024-03-14 DIAGNOSIS — Z79899 Other long term (current) drug therapy: Secondary | ICD-10-CM

## 2024-03-14 DIAGNOSIS — Z8719 Personal history of other diseases of the digestive system: Secondary | ICD-10-CM

## 2024-03-14 DIAGNOSIS — M19042 Primary osteoarthritis, left hand: Secondary | ICD-10-CM

## 2024-03-14 DIAGNOSIS — L409 Psoriasis, unspecified: Secondary | ICD-10-CM

## 2024-03-14 DIAGNOSIS — M19072 Primary osteoarthritis, left ankle and foot: Secondary | ICD-10-CM

## 2024-03-14 DIAGNOSIS — G8929 Other chronic pain: Secondary | ICD-10-CM

## 2024-03-14 DIAGNOSIS — R7303 Prediabetes: Secondary | ICD-10-CM

## 2024-03-14 DIAGNOSIS — M47816 Spondylosis without myelopathy or radiculopathy, lumbar region: Secondary | ICD-10-CM | POA: Diagnosis not present

## 2024-03-14 DIAGNOSIS — M7062 Trochanteric bursitis, left hip: Secondary | ICD-10-CM

## 2024-03-14 DIAGNOSIS — Z8669 Personal history of other diseases of the nervous system and sense organs: Secondary | ICD-10-CM

## 2024-03-14 DIAGNOSIS — Z8639 Personal history of other endocrine, nutritional and metabolic disease: Secondary | ICD-10-CM

## 2024-03-14 NOTE — Patient Instructions (Signed)
 Standing Labs We placed an order today for your standing lab work.   Please have your standing labs drawn in April and every 3 months  Please have your labs drawn 2 weeks prior to your appointment so that the provider can discuss your lab results at your appointment, if possible.  Please note that you may see your imaging and lab results in MyChart before we have reviewed them. We will contact you once all results are reviewed. Please allow our office up to 72 hours to thoroughly review all of the results before contacting the office for clarification of your results.  WALK-IN LAB HOURS  Monday through Thursday from 8:00 am - 4:30 pm and Friday from 8:00 am-12:00 pm.  Patients with office visits requiring labs will be seen before walk-in labs.  You may encounter longer than normal wait times. Please allow additional time. Wait times may be shorter on  Monday and Thursday afternoons.  We do not book appointments for walk-in labs. We appreciate your patience and understanding with our staff.   Labs are drawn by [facility]. Please bring your co-pay at the time of your lab draw.  You may receive a bill from [facility] for your lab work.  Please note if you are on Hydroxychloroquine and and an order has been placed for a Hydroxychloroquine level,  you will need to have it drawn 4 hours or more after your last dose.  If you wish to have your labs drawn at another location, please call the office 24 hours in advance so we can fax the orders.  The office is located at [address], [place], [place] [address]   If you have any questions regarding directions or hours of operation,  please call [phone].   As a reminder, please drink plenty of water prior to coming for your lab work. Thanks!   Vaccines You are taking a medication(s) that can suppress your immune system.  The following immunizations are recommended: Flu annually Covid-19  Td/Tdap (tetanus, diphtheria, pertussis) every  10 years Pneumonia (Prevnar 15 then Pneumovax 23 at least 1 year apart.  Alternatively, can take Prevnar 20 without needing additional dose) Shingrix: 2 doses from 4 weeks to 6 months apart  Please check with your PCP to make sure you are up to date.   If you have signs or symptoms of an infection or start antibiotics: First, call your PCP for workup of your infection. Hold your medication through the infection, until you complete your antibiotics, and until symptoms resolve if you take the following: Injectable medication (Actemra, Benlysta, Cimzia, Cosentyx, Enbrel, Humira, Kevzara, Orencia, Remicade, Simponi, Stelara, Taltz, Tremfya) Methotrexate Leflunomide (Arava) Mycophenolate (Cellcept) Earma, Olumiant, or Rinvoq

## 2024-03-15 ENCOUNTER — Ambulatory Visit: Payer: Self-pay | Admitting: Physician Assistant

## 2024-03-16 LAB — CBC WITH DIFFERENTIAL/PLATELET
Absolute Lymphocytes: 1891 {cells}/uL (ref 850–3900)
Absolute Monocytes: 516 {cells}/uL (ref 200–950)
Basophils Absolute: 52 {cells}/uL (ref 0–200)
Basophils Relative: 0.9 %
Eosinophils Absolute: 284 {cells}/uL (ref 15–500)
Eosinophils Relative: 4.9 %
HCT: 39.1 % (ref 35.9–46.0)
Hemoglobin: 13.1 g/dL (ref 11.7–15.5)
MCH: 30.6 pg (ref 27.0–33.0)
MCHC: 33.5 g/dL (ref 31.6–35.4)
MCV: 91.4 fL (ref 81.4–101.7)
MPV: 12 fL (ref 7.5–12.5)
Monocytes Relative: 8.9 %
Neutro Abs: 3057 {cells}/uL (ref 1500–7800)
Neutrophils Relative %: 52.7 %
Platelets: 225 10*3/uL (ref 140–400)
RBC: 4.28 Million/uL (ref 3.80–5.10)
RDW: 13.5 % (ref 11.0–15.0)
Total Lymphocyte: 32.6 %
WBC: 5.8 10*3/uL (ref 3.8–10.8)

## 2024-03-16 LAB — COMPREHENSIVE METABOLIC PANEL WITH GFR
AG Ratio: 1.8 (calc) (ref 1.0–2.5)
ALT: 20 U/L (ref 6–29)
AST: 17 U/L (ref 10–35)
Albumin: 4.3 g/dL (ref 3.6–5.1)
Alkaline phosphatase (APISO): 89 U/L (ref 37–153)
BUN: 16 mg/dL (ref 7–25)
CO2: 29 mmol/L (ref 20–32)
Calcium: 9.5 mg/dL (ref 8.6–10.4)
Chloride: 105 mmol/L (ref 98–110)
Creat: 0.64 mg/dL (ref 0.50–1.03)
Globulin: 2.4 g/dL (ref 1.9–3.7)
Glucose, Bld: 100 mg/dL — ABNORMAL HIGH (ref 65–99)
Potassium: 4.3 mmol/L (ref 3.5–5.3)
Sodium: 141 mmol/L (ref 135–146)
Total Bilirubin: 0.4 mg/dL (ref 0.2–1.2)
Total Protein: 6.7 g/dL (ref 6.1–8.1)
eGFR: 102 mL/min/{1.73_m2}

## 2024-03-16 LAB — QUANTIFERON-TB GOLD PLUS
Mitogen-NIL: >10 [IU]/mL
NIL: 0.04 [IU]/mL
QuantiFERON-TB Gold Plus: NEGATIVE
TB1-NIL: 0 [IU]/mL
TB2-NIL: 0 [IU]/mL

## 2024-03-23 ENCOUNTER — Other Ambulatory Visit: Payer: Self-pay | Admitting: Physician Assistant

## 2024-03-23 DIAGNOSIS — Z79899 Other long term (current) drug therapy: Secondary | ICD-10-CM

## 2024-03-23 DIAGNOSIS — L409 Psoriasis, unspecified: Secondary | ICD-10-CM

## 2024-03-23 DIAGNOSIS — L4050 Arthropathic psoriasis, unspecified: Secondary | ICD-10-CM

## 2024-03-23 NOTE — Telephone Encounter (Signed)
 Last Fill: 12/28/2023  Labs: 03/14/2024 CBC and CMP WNL   TB Gold: 03/14/2024 negative    Next Visit: 08/14/2024  Last Visit: 03/14/2024  DX: Psoriatic arthritis   Current Dose per office note on 03/14/2024: Taltz  80 mg subcutaneous injections every 4 weeks.   Okay to refill Taltz ?

## 2024-03-26 ENCOUNTER — Ambulatory Visit

## 2024-08-14 ENCOUNTER — Ambulatory Visit: Admitting: Physician Assistant
